# Patient Record
Sex: Female | Born: 1950
Health system: Southern US, Community
[De-identification: ages and names within clinical notes are randomized; demographics above are authoritative.]

## PROBLEM LIST (undated history)

## (undated) DIAGNOSIS — R112 Nausea with vomiting, unspecified: Secondary | ICD-10-CM

## (undated) DIAGNOSIS — I639 Cerebral infarction, unspecified: Secondary | ICD-10-CM

## (undated) DIAGNOSIS — C801 Malignant (primary) neoplasm, unspecified: Secondary | ICD-10-CM

## (undated) DIAGNOSIS — I10 Essential (primary) hypertension: Secondary | ICD-10-CM

## (undated) DIAGNOSIS — F319 Bipolar disorder, unspecified: Secondary | ICD-10-CM

## (undated) DIAGNOSIS — E119 Type 2 diabetes mellitus without complications: Secondary | ICD-10-CM

## (undated) DIAGNOSIS — Z9889 Other specified postprocedural states: Secondary | ICD-10-CM

## (undated) HISTORY — PX: ABDOMINAL HYSTERECTOMY: SHX81

## (undated) HISTORY — PX: TONSILLECTOMY: SUR1361

## (undated) HISTORY — PX: OTHER SURGICAL HISTORY: SHX169

## (undated) HISTORY — PX: CHOLECYSTECTOMY: SHX55

---

## 1999-08-13 ENCOUNTER — Inpatient Hospital Stay (HOSPITAL_COMMUNITY): Admission: EM | Admit: 1999-08-13 | Discharge: 1999-08-14 | Payer: Self-pay | Admitting: Cardiology

## 2000-08-08 ENCOUNTER — Encounter: Payer: Self-pay | Admitting: Family Medicine

## 2000-08-08 ENCOUNTER — Ambulatory Visit (HOSPITAL_COMMUNITY): Admission: RE | Admit: 2000-08-08 | Discharge: 2000-08-08 | Payer: Self-pay | Admitting: Family Medicine

## 2000-12-16 ENCOUNTER — Ambulatory Visit (HOSPITAL_COMMUNITY): Admission: RE | Admit: 2000-12-16 | Discharge: 2000-12-16 | Payer: Self-pay | Admitting: Family Medicine

## 2000-12-16 ENCOUNTER — Encounter: Payer: Self-pay | Admitting: Family Medicine

## 2000-12-21 ENCOUNTER — Encounter: Payer: Self-pay | Admitting: Family Medicine

## 2000-12-21 ENCOUNTER — Ambulatory Visit (HOSPITAL_COMMUNITY): Admission: RE | Admit: 2000-12-21 | Discharge: 2000-12-21 | Payer: Self-pay | Admitting: Family Medicine

## 2000-12-30 ENCOUNTER — Other Ambulatory Visit: Admission: RE | Admit: 2000-12-30 | Discharge: 2000-12-30 | Payer: Self-pay | Admitting: Urology

## 2001-01-16 ENCOUNTER — Ambulatory Visit (HOSPITAL_COMMUNITY): Admission: RE | Admit: 2001-01-16 | Discharge: 2001-01-16 | Payer: Self-pay | Admitting: *Deleted

## 2001-01-16 ENCOUNTER — Encounter: Payer: Self-pay | Admitting: Urology

## 2001-03-22 ENCOUNTER — Encounter: Payer: Self-pay | Admitting: Family Medicine

## 2001-03-22 ENCOUNTER — Ambulatory Visit (HOSPITAL_COMMUNITY): Admission: RE | Admit: 2001-03-22 | Discharge: 2001-03-22 | Payer: Self-pay | Admitting: Family Medicine

## 2001-04-07 ENCOUNTER — Encounter: Payer: Self-pay | Admitting: Family Medicine

## 2001-04-07 ENCOUNTER — Ambulatory Visit (HOSPITAL_COMMUNITY): Admission: RE | Admit: 2001-04-07 | Discharge: 2001-04-07 | Payer: Self-pay | Admitting: Family Medicine

## 2001-04-19 ENCOUNTER — Ambulatory Visit (HOSPITAL_COMMUNITY): Admission: RE | Admit: 2001-04-19 | Discharge: 2001-04-19 | Payer: Self-pay | Admitting: Internal Medicine

## 2001-04-19 ENCOUNTER — Encounter: Payer: Self-pay | Admitting: Internal Medicine

## 2001-04-24 ENCOUNTER — Ambulatory Visit (HOSPITAL_COMMUNITY): Admission: RE | Admit: 2001-04-24 | Discharge: 2001-04-24 | Payer: Self-pay | Admitting: Internal Medicine

## 2001-04-27 ENCOUNTER — Encounter: Payer: Self-pay | Admitting: Internal Medicine

## 2001-04-27 ENCOUNTER — Ambulatory Visit (HOSPITAL_COMMUNITY): Admission: RE | Admit: 2001-04-27 | Discharge: 2001-04-27 | Payer: Self-pay | Admitting: Internal Medicine

## 2001-05-15 ENCOUNTER — Ambulatory Visit (HOSPITAL_COMMUNITY): Admission: RE | Admit: 2001-05-15 | Discharge: 2001-05-15 | Payer: Self-pay | Admitting: Internal Medicine

## 2001-05-15 ENCOUNTER — Encounter: Payer: Self-pay | Admitting: Internal Medicine

## 2001-09-12 ENCOUNTER — Encounter: Payer: Self-pay | Admitting: Family Medicine

## 2001-09-12 ENCOUNTER — Ambulatory Visit (HOSPITAL_COMMUNITY): Admission: RE | Admit: 2001-09-12 | Discharge: 2001-09-12 | Payer: Self-pay | Admitting: Family Medicine

## 2001-10-24 ENCOUNTER — Emergency Department (HOSPITAL_COMMUNITY): Admission: EM | Admit: 2001-10-24 | Discharge: 2001-10-24 | Payer: Self-pay | Admitting: Emergency Medicine

## 2001-11-07 ENCOUNTER — Encounter: Payer: Self-pay | Admitting: Family Medicine

## 2001-11-07 ENCOUNTER — Encounter (HOSPITAL_COMMUNITY): Admission: RE | Admit: 2001-11-07 | Discharge: 2001-12-07 | Payer: Self-pay | Admitting: Family Medicine

## 2001-11-10 ENCOUNTER — Ambulatory Visit (HOSPITAL_COMMUNITY): Admission: RE | Admit: 2001-11-10 | Discharge: 2001-11-10 | Payer: Self-pay | Admitting: Family Medicine

## 2001-11-10 ENCOUNTER — Encounter: Payer: Self-pay | Admitting: Family Medicine

## 2001-11-27 ENCOUNTER — Encounter: Payer: Self-pay | Admitting: Family Medicine

## 2001-11-27 ENCOUNTER — Ambulatory Visit (HOSPITAL_COMMUNITY): Admission: RE | Admit: 2001-11-27 | Discharge: 2001-11-27 | Payer: Self-pay | Admitting: Family Medicine

## 2001-12-08 ENCOUNTER — Ambulatory Visit (HOSPITAL_COMMUNITY): Admission: RE | Admit: 2001-12-08 | Discharge: 2001-12-08 | Payer: Self-pay | Admitting: Family Medicine

## 2001-12-08 ENCOUNTER — Encounter: Payer: Self-pay | Admitting: Family Medicine

## 2001-12-22 ENCOUNTER — Inpatient Hospital Stay (HOSPITAL_COMMUNITY): Admission: EM | Admit: 2001-12-22 | Discharge: 2001-12-24 | Payer: Self-pay | Admitting: Emergency Medicine

## 2002-02-05 ENCOUNTER — Encounter (INDEPENDENT_AMBULATORY_CARE_PROVIDER_SITE_OTHER): Payer: Self-pay

## 2002-02-05 ENCOUNTER — Ambulatory Visit (HOSPITAL_COMMUNITY): Admission: RE | Admit: 2002-02-05 | Discharge: 2002-02-05 | Payer: Self-pay | Admitting: *Deleted

## 2002-05-15 ENCOUNTER — Ambulatory Visit (HOSPITAL_COMMUNITY): Admission: RE | Admit: 2002-05-15 | Discharge: 2002-05-15 | Payer: Self-pay | Admitting: Family Medicine

## 2002-05-15 ENCOUNTER — Encounter: Payer: Self-pay | Admitting: Family Medicine

## 2002-08-21 ENCOUNTER — Encounter: Payer: Self-pay | Admitting: Family Medicine

## 2002-08-21 ENCOUNTER — Ambulatory Visit (HOSPITAL_COMMUNITY): Admission: RE | Admit: 2002-08-21 | Discharge: 2002-08-21 | Payer: Self-pay | Admitting: Family Medicine

## 2002-09-03 ENCOUNTER — Encounter: Payer: Self-pay | Admitting: Family Medicine

## 2002-09-03 ENCOUNTER — Ambulatory Visit (HOSPITAL_COMMUNITY): Admission: RE | Admit: 2002-09-03 | Discharge: 2002-09-03 | Payer: Self-pay | Admitting: Family Medicine

## 2003-02-08 ENCOUNTER — Emergency Department (HOSPITAL_COMMUNITY): Admission: EM | Admit: 2003-02-08 | Discharge: 2003-02-08 | Payer: Self-pay | Admitting: *Deleted

## 2003-10-23 ENCOUNTER — Emergency Department (HOSPITAL_COMMUNITY): Admission: EM | Admit: 2003-10-23 | Discharge: 2003-10-23 | Payer: Self-pay | Admitting: Emergency Medicine

## 2003-12-30 ENCOUNTER — Ambulatory Visit (HOSPITAL_COMMUNITY): Admission: RE | Admit: 2003-12-30 | Discharge: 2003-12-30 | Payer: Self-pay | Admitting: Internal Medicine

## 2004-01-10 ENCOUNTER — Ambulatory Visit (HOSPITAL_COMMUNITY): Admission: RE | Admit: 2004-01-10 | Discharge: 2004-01-10 | Payer: Self-pay | Admitting: Internal Medicine

## 2004-01-31 ENCOUNTER — Ambulatory Visit: Payer: Self-pay | Admitting: Gastroenterology

## 2004-02-03 ENCOUNTER — Ambulatory Visit: Payer: Self-pay | Admitting: Gastroenterology

## 2004-03-31 ENCOUNTER — Inpatient Hospital Stay (HOSPITAL_COMMUNITY): Admission: EM | Admit: 2004-03-31 | Discharge: 2004-04-02 | Payer: Self-pay | Admitting: Emergency Medicine

## 2004-04-02 ENCOUNTER — Inpatient Hospital Stay (HOSPITAL_COMMUNITY): Admission: RE | Admit: 2004-04-02 | Discharge: 2004-04-09 | Payer: Self-pay | Admitting: Psychiatry

## 2004-04-02 ENCOUNTER — Ambulatory Visit: Payer: Self-pay | Admitting: Psychiatry

## 2004-04-05 ENCOUNTER — Encounter: Payer: Self-pay | Admitting: Emergency Medicine

## 2004-04-08 ENCOUNTER — Encounter: Payer: Self-pay | Admitting: Emergency Medicine

## 2004-05-04 ENCOUNTER — Emergency Department (HOSPITAL_COMMUNITY): Admission: EM | Admit: 2004-05-04 | Discharge: 2004-05-04 | Payer: Self-pay | Admitting: Emergency Medicine

## 2004-06-12 IMAGING — CT CT ABDOMEN W/ CM
1 of 4 series · 12 of 32 positions shown, 18 images · IV contrast (CONTRAST)
Comparison: none

FINDINGS
CLINICAL DATA: CHEST AND ABDOMINAL PAIN.
CT CHEST WITH CONTRAST
ROUTINE CT SCAN OF THE CHEST, ABDOMEN AND PELVIS WAS PERFORMED FOLLOWING ORAL CONTRAST
ADMINISTRATION AND DURING INFUSION OF 150 CC OMNIPAQUE 300, IV.  COMPARISON STUDY IS CT SCAN OF THE
ABDOMEN AND PELVIS DATED 09/12/01.
THERE IS NO ENLARGED HILAR, MEDIASTINAL OR AXILLARY ADENOPATHY.  THE HEART AND GREAT VESSELS ARE
UNREMARKABLE IN APPEARANCE.  THERE IS NO PLEURAL OR PERICARDIAL EFFUSION.
EVALUATION OF THE LUNG WINDOWS DEMONSTRATES NO FOCAL INFILTRATE, EDEMA OR PULMONARY NODULES.
IMPRESSION
NEGATIVE CT SCAN OF THE CHEST.
CT ABDOMEN WITH CONTRAST
THE LIVER, SPLEEN, PANCREAS, ADRENAL GLANDS, AND KIDNEYS ARE UNREMARKABLE IN APPEARANCE.  NO
ENLARGED RETROPERITONEAL ADENOPATHY OR ASCITES.  THE VISUALIZED BOWEL IS UNREMARKABLE IN
APPEARANCE.  PATIENT IS STATUS POST CHOLECYSTECTOMY.
STATUS POST CHOLECYSTECTOMY.   NO ACUTE PROCESSES WITHIN THE ABDOMEN.
CT PELVIS WITH CONTRAST
NO ABNORMAL SOFT TISSUE MASSES, FLUID COLLECTIONS OR ENLARGED ADENOPATHY IS DETECTED.  THE PATIENT
IS STATUS POST HYSTERECTOMY.
STATUS POST HYSTERECTOMY; OTHERWISE NEGATIVE CT SCAN OF THE PELVIS.

[Series 5343: — · axial · 0.78mm/px · z∈[+1260,+1644]mm · 12 of 93 slices shown, 18 images]
[im 8/93  soft-tissue]
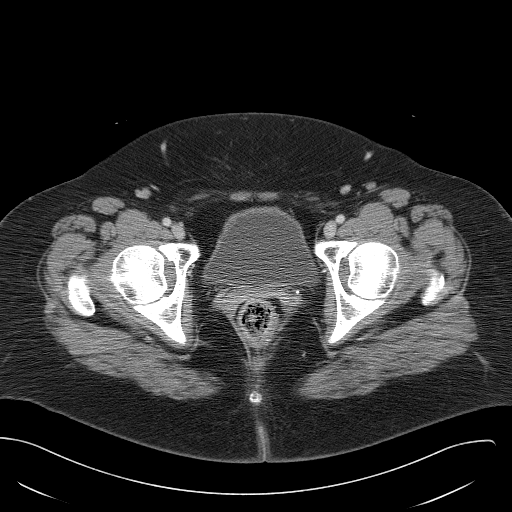
[im 8/93  bone]
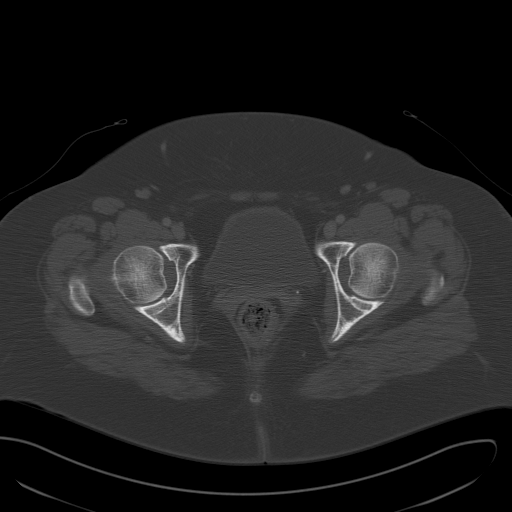
[im 15/93  soft-tissue]
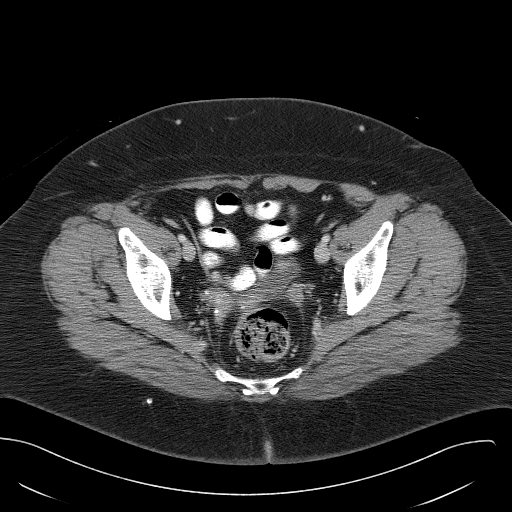
[im 22/93  soft-tissue]
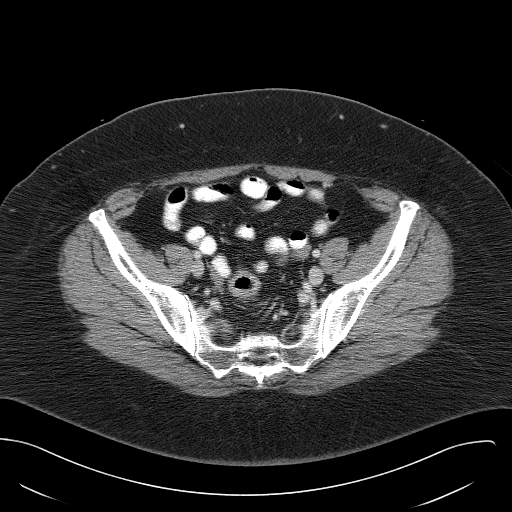
[im 29/93  soft-tissue]
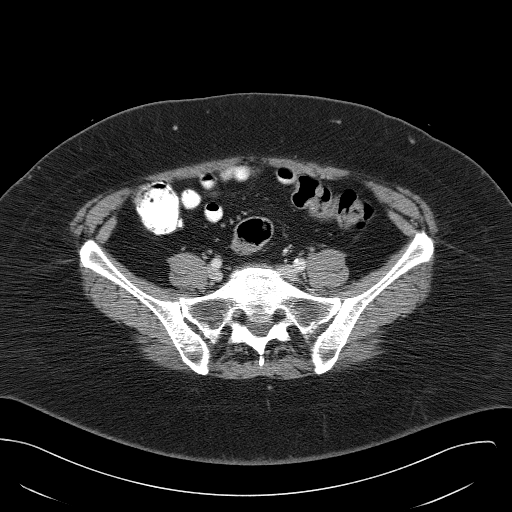
[im 36/93  soft-tissue]
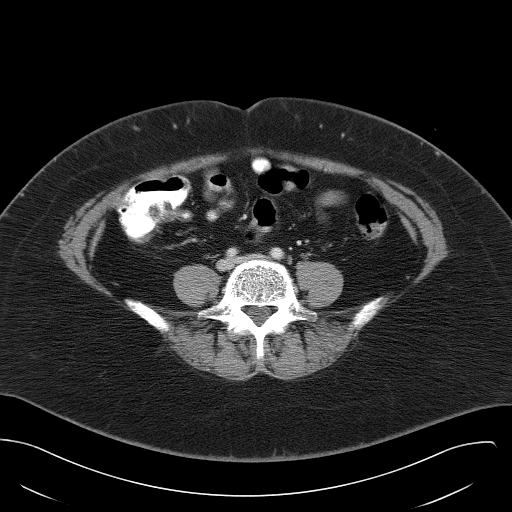
[im 43/93  soft-tissue]
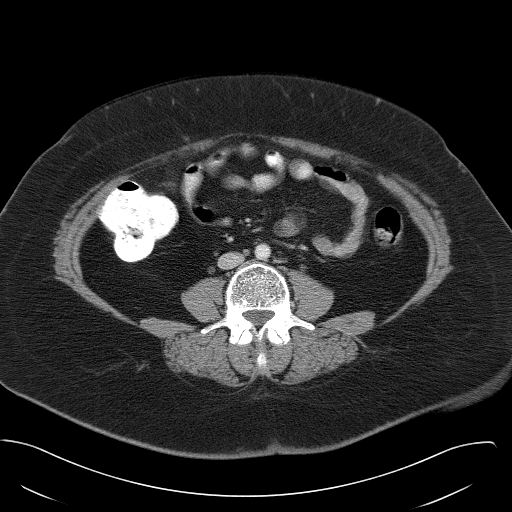
[im 50/93  soft-tissue]
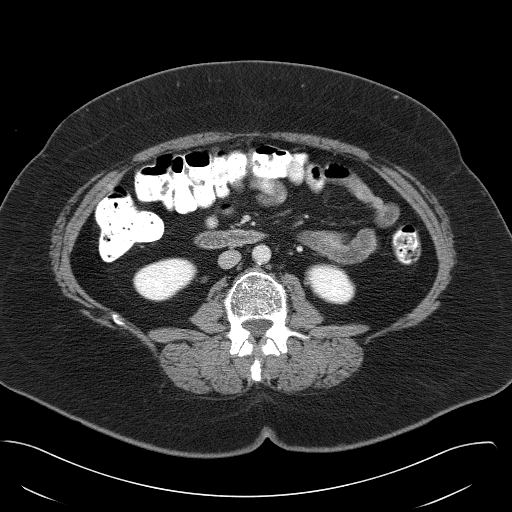
[im 57/93  soft-tissue]
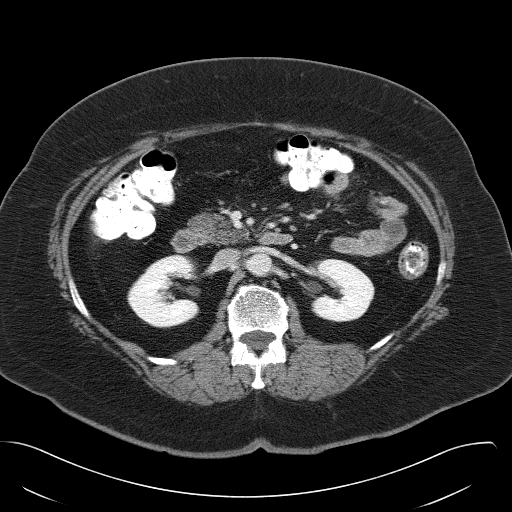
[im 64/93  soft-tissue]
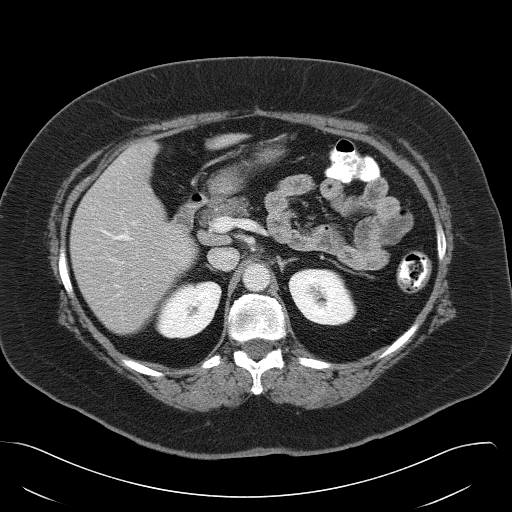
[im 64/93  lung]
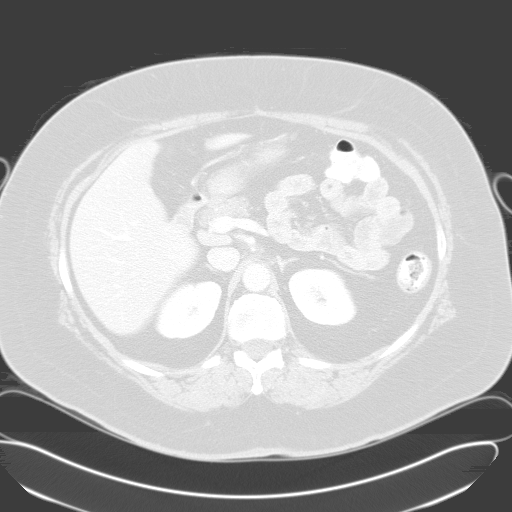
[im 64/93  bone]
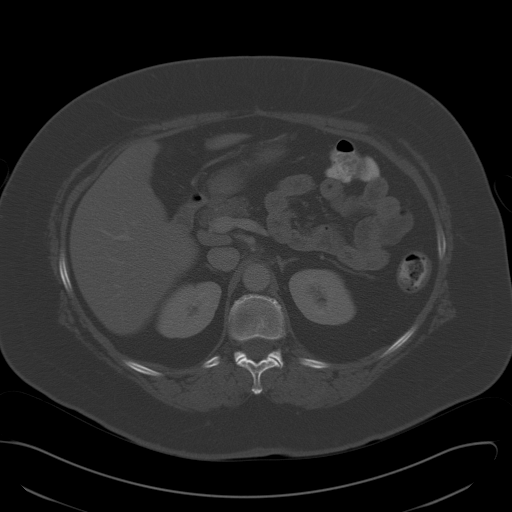
[im 71/93  soft-tissue]
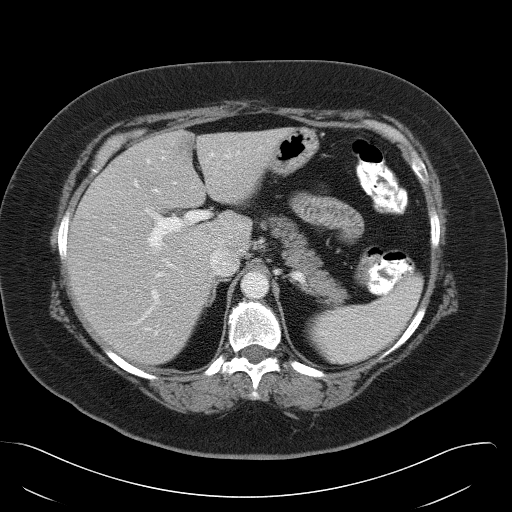
[im 71/93  lung]
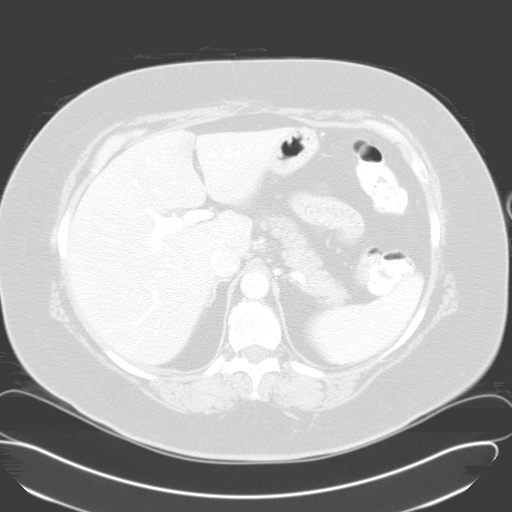
[im 78/93  soft-tissue]
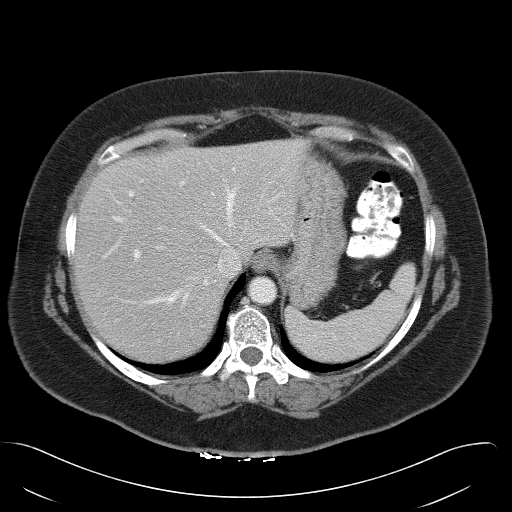
[im 78/93  lung]
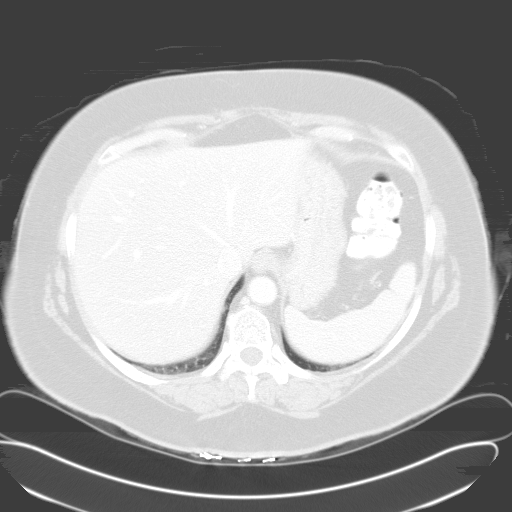
[im 85/93  soft-tissue]
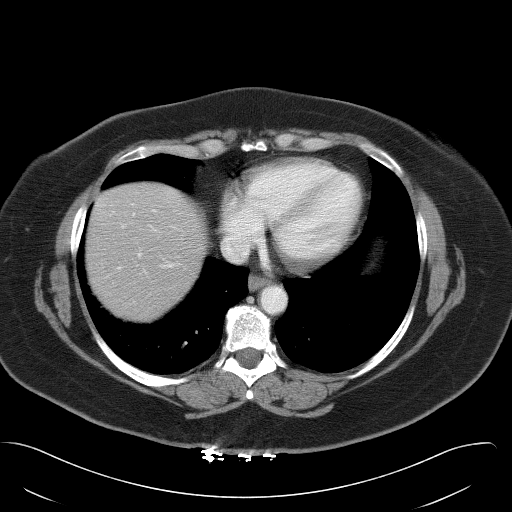
[im 85/93  lung]
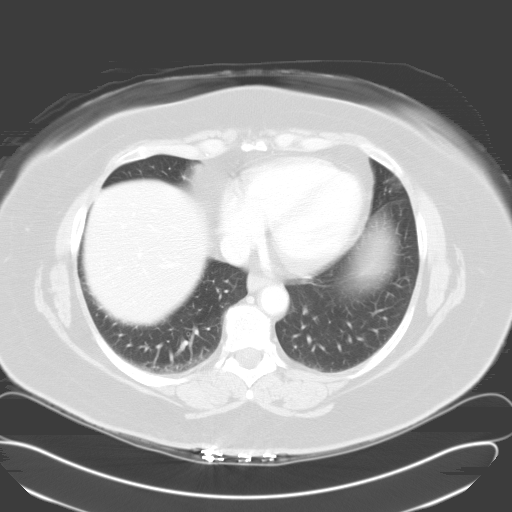

[12 of 32 positions shown; findings below may reference images not displayed]

## 2004-08-13 ENCOUNTER — Ambulatory Visit (HOSPITAL_COMMUNITY): Admission: RE | Admit: 2004-08-13 | Discharge: 2004-08-13 | Payer: Self-pay | Admitting: Family Medicine

## 2004-08-28 ENCOUNTER — Ambulatory Visit (HOSPITAL_COMMUNITY): Admission: RE | Admit: 2004-08-28 | Discharge: 2004-08-28 | Payer: Self-pay | Admitting: Family Medicine

## 2004-10-01 ENCOUNTER — Ambulatory Visit (HOSPITAL_COMMUNITY): Admission: RE | Admit: 2004-10-01 | Discharge: 2004-10-01 | Payer: Self-pay | Admitting: Internal Medicine

## 2005-01-10 ENCOUNTER — Emergency Department (HOSPITAL_COMMUNITY): Admission: EM | Admit: 2005-01-10 | Discharge: 2005-01-10 | Payer: Self-pay | Admitting: Emergency Medicine

## 2008-01-12 ENCOUNTER — Emergency Department (HOSPITAL_COMMUNITY): Admission: EM | Admit: 2008-01-12 | Discharge: 2008-01-12 | Payer: Self-pay | Admitting: Emergency Medicine

## 2010-01-13 ENCOUNTER — Ambulatory Visit (HOSPITAL_COMMUNITY): Admission: RE | Admit: 2010-01-13 | Payer: Self-pay | Admitting: Gastroenterology

## 2010-03-15 ENCOUNTER — Encounter: Payer: Self-pay | Admitting: Internal Medicine

## 2010-03-27 ENCOUNTER — Inpatient Hospital Stay (HOSPITAL_COMMUNITY)
Admit: 2010-03-27 | Discharge: 2010-03-27 | Disposition: A | Discharge status: Home / Self Care | Payer: Medicaid Other | Attending: Internal Medicine | Admitting: Internal Medicine

## 2010-03-27 ENCOUNTER — Encounter (HOSPITAL_COMMUNITY): Payer: Self-pay

## 2010-03-27 ENCOUNTER — Inpatient Hospital Stay (HOSPITAL_COMMUNITY): Payer: Medicaid Other

## 2010-03-27 ENCOUNTER — Inpatient Hospital Stay (HOSPITAL_COMMUNITY)
Admission: EM | Admit: 2010-03-27 | Discharge: 2010-04-03 | DRG: 682 | Disposition: A | Discharge status: Home Health | Payer: Medicaid Other | Attending: Internal Medicine | Admitting: Internal Medicine

## 2010-03-27 DIAGNOSIS — E871 Hypo-osmolality and hyponatremia: Secondary | ICD-10-CM | POA: Diagnosis present

## 2010-03-27 DIAGNOSIS — R9431 Abnormal electrocardiogram [ECG] [EKG]: Secondary | ICD-10-CM | POA: Diagnosis present

## 2010-03-27 DIAGNOSIS — F19939 Other psychoactive substance use, unspecified with withdrawal, unspecified: Secondary | ICD-10-CM | POA: Diagnosis present

## 2010-03-27 DIAGNOSIS — F1320 Sedative, hypnotic or anxiolytic dependence, uncomplicated: Secondary | ICD-10-CM | POA: Diagnosis present

## 2010-03-27 DIAGNOSIS — I10 Essential (primary) hypertension: Secondary | ICD-10-CM | POA: Diagnosis present

## 2010-03-27 DIAGNOSIS — D649 Anemia, unspecified: Secondary | ICD-10-CM | POA: Diagnosis present

## 2010-03-27 DIAGNOSIS — E876 Hypokalemia: Secondary | ICD-10-CM | POA: Diagnosis not present

## 2010-03-27 DIAGNOSIS — F319 Bipolar disorder, unspecified: Secondary | ICD-10-CM | POA: Diagnosis present

## 2010-03-27 DIAGNOSIS — N179 Acute kidney failure, unspecified: Principal | ICD-10-CM | POA: Diagnosis present

## 2010-03-27 DIAGNOSIS — G9349 Other encephalopathy: Secondary | ICD-10-CM | POA: Diagnosis present

## 2010-03-27 DIAGNOSIS — N390 Urinary tract infection, site not specified: Secondary | ICD-10-CM | POA: Diagnosis present

## 2010-03-27 DIAGNOSIS — A498 Other bacterial infections of unspecified site: Secondary | ICD-10-CM | POA: Diagnosis present

## 2010-03-27 DIAGNOSIS — T438X5A Adverse effect of other psychotropic drugs, initial encounter: Secondary | ICD-10-CM | POA: Diagnosis present

## 2010-03-27 HISTORY — DX: Malignant (primary) neoplasm, unspecified: C80.1

## 2010-03-27 HISTORY — DX: Essential (primary) hypertension: I10

## 2010-03-27 LAB — BASIC METABOLIC PANEL
BUN: 62 mg/dL — ABNORMAL HIGH (ref 6–23)
CO2: 20 mEq/L (ref 19–32)
Chloride: 100 mEq/L (ref 96–112)
Creatinine, Ser: 8.92 mg/dL — ABNORMAL HIGH (ref 0.4–1.2)
GFR calc Af Amer: 6 mL/min — ABNORMAL LOW (ref >60)
Potassium: 3.7 mEq/L (ref 3.5–5.1)

## 2010-03-27 LAB — DIFFERENTIAL
Basophils Absolute: 0.1 10*3/uL (ref 0.0–0.1)
Eosinophils Relative: 1 % (ref 0–5)
Lymphocytes Relative: 23 % (ref 12–46)
Monocytes Absolute: 0.6 10*3/uL (ref 0.1–1.0)

## 2010-03-27 LAB — MAGNESIUM: Magnesium: 2 mg/dL (ref 1.5–2.5)

## 2010-03-27 LAB — CARDIAC PANEL(CRET KIN+CKTOT+MB+TROPI)
CK, MB: 1.8 ng/mL (ref 0.3–4.0)
Relative Index: INVALID (ref 0.0–2.5)
Total CK: 51 U/L (ref 7–177)
Troponin I: 0.02 ng/mL (ref 0.00–0.06)

## 2010-03-27 LAB — CK TOTAL AND CKMB (NOT AT ARMC)
CK, MB: 1.9 ng/mL (ref 0.3–4.0)
Relative Index: INVALID (ref 0.0–2.5)

## 2010-03-27 LAB — HEPATIC FUNCTION PANEL
ALT: 17 U/L (ref 0–35)
Albumin: 4.2 g/dL (ref 3.5–5.2)
Indirect Bilirubin: 0.6 mg/dL (ref 0.3–0.9)

## 2010-03-27 LAB — CBC
HCT: 38.1 % (ref 36.0–46.0)
Platelets: 323 10*3/uL (ref 150–400)
RBC: 4.21 MIL/uL (ref 3.87–5.11)
WBC: 13.2 10*3/uL — ABNORMAL HIGH (ref 4.0–10.5)

## 2010-03-27 LAB — LITHIUM LEVEL: Lithium Lvl: 2.58 mEq/L (ref 0.80–1.40)

## 2010-03-27 LAB — LIPASE, BLOOD: Lipase: 190 U/L — ABNORMAL HIGH (ref 11–59)

## 2010-03-27 LAB — PROTIME-INR: Prothrombin Time: 14.4 seconds (ref 11.6–15.2)

## 2010-03-28 ENCOUNTER — Inpatient Hospital Stay (HOSPITAL_COMMUNITY): Payer: Medicaid Other

## 2010-03-28 LAB — URINALYSIS, ROUTINE W REFLEX MICROSCOPIC
Protein, ur: NEGATIVE mg/dL
Urobilinogen, UA: 0.2 mg/dL (ref 0.0–1.0)

## 2010-03-28 LAB — BASIC METABOLIC PANEL
CO2: 26 mEq/L (ref 19–32)
Calcium: 8.3 mg/dL — ABNORMAL LOW (ref 8.4–10.5)
Creatinine, Ser: 4.76 mg/dL — ABNORMAL HIGH (ref 0.4–1.2)
Glucose, Bld: 125 mg/dL — ABNORMAL HIGH (ref 70–99)

## 2010-03-28 LAB — T4, FREE: Free T4: 0.9 ng/dL (ref 0.80–1.80)

## 2010-03-28 LAB — HEPATITIS B SURFACE ANTIBODY,QUALITATIVE: Hep B S Ab: NEGATIVE

## 2010-03-28 LAB — RAPID URINE DRUG SCREEN, HOSP PERFORMED
Amphetamines: NOT DETECTED
Barbiturates: NOT DETECTED
Benzodiazepines: POSITIVE — AB

## 2010-03-28 LAB — URINE MICROSCOPIC-ADD ON

## 2010-03-28 LAB — HEPATITIS B SURFACE ANTIGEN: Hepatitis B Surface Ag: NEGATIVE

## 2010-03-28 LAB — TSH: TSH: 3.56 u[IU]/mL (ref 0.350–4.500)

## 2010-03-28 NOTE — Op Note (Signed)
  [name], [name]              ACCOUNT NO.:  [id number]  MEDICAL RECORD NO.:  [id number]           PATIENT TYPE:  I  LOCATION:  IC10                          FACILITY:  [facility]  PHYSICIAN:  Dalia Heading, M.D.  DATE OF BIRTH:  [birth year]  DATE OF PROCEDURE:  03/27/2010 DATE OF DISCHARGE:                              OPERATIVE REPORT   CHIEF COMPLAINT:  Need for dialysis due to lithium toxicity.  PROCEDURE NOTE:  At the request of Dr. Kristian Covey, a right femoral hemodialysis catheter is being inserted for urgent hemodialysis due to lithium toxicity.  The risks and benefits of the procedure were fully explained to the patient, gave informed consent.  Time-out was performed.  The right groin was prepped and draped using the usual sterile technique with Betadine.  A 1% Xylocaine was used for local anesthesia.  A needle was advanced into the right femoral vein using the Seldinger technique without difficulty.  A guidewire was then advanced and the catheter was inserted over the guidewire.  The guidewire was removed.  Good backflow was noted from both ports.  It was flushed with saline.  A dry sterile dressing was applied.  The patient tolerated the procedure well and is ready for dialysis.     Dalia Heading, M.D.     MAJ/MEDQ  D:  03/27/2010  T:  03/28/2010  Job:  [id number]  cc:   Jorja Loa, M.D. Fax: [phone]  Electronically Signed by Franky Macho M.D. on 03/28/2010 01:09:56 PM

## 2010-03-28 NOTE — H&P (Signed)
[name], [name]              ACCOUNT NO.:  [id number]  MEDICAL RECORD NO.:  [id number]           PATIENT TYPE:  I  LOCATION:  IC10                          FACILITY:  [facility]  PHYSICIAN:  Elliot Cousin, M.D.    DATE OF BIRTH:  03-21-1950  DATE OF ADMISSION:  03/27/2010 DATE OF DISCHARGE:  LH                             HISTORY & PHYSICAL   PRIMARY PSYCHIATRIST:  [facility], Dr. Raliegh Scarlet.  CHIEF COMPLAINT:  Nausea, vomiting, and diarrhea.  HISTORY OF PRESENT ILLNESS:  The patient is a 60 year old woman with a past medical history significant for bipolar disorder, esophagitis, and hypertension.  She presents to the emergency department today with a 3- week history of progressive nausea, vomiting, and diarrhea.  Her symptoms started approximately 3 weeks ago.  They have become progressively worse.  Over the past 2-3 days, she has been unable to keep any liquids or solids down.  She has been able to tolerate most of her medicines; however, she stopped taking lithium carbonate 1-2 weeks ago.  She says that every time, she tried to take the lithium, she became more nauseated and had worsening vomiting.  Over the past 24 hours, she has had at least 10 episodes of vomiting.  She denies bright red blood in her emesis.  She denies coffee-ground emesis.  She has also had multiple loose bowel movements, numbering greater than 5 over the past 24 hours, but more than 15-20 over the past 2-3 days.  She denies bright red blood per rectum.  She denies black tarry stools.  She has had no abdominal pain or pain with urination.  She has not urinated over the past 24-48 hours.  She has had subjective fever and chills.  She has had lightheadedness, spinning dizziness, a generalized frontal headache, blurred vision, and a hand tremor.  She denies chest pain, shortness of breath, or pleurisy.  She has had some swelling in her ankles bilaterally.  In the emergency department, the patient is bradycardic  with a heart rate of 55 beats per minute.  She is afebrile.  Her blood pressure is 109/55.  Her EKG reveals sinus bradycardia with a first-degree AV block, T-wave abnormalities, and prolonged QT interval of 524/501.  Her lithium level is 2.58.  Her BUN is 62.  Her creatinine is 8.92.  Her lipase is 190.  Her WBC is 13.2.  She is being admitted for further evaluation and management.  Of note, I did request that the emergency department physician Dr. Radford Pax consult nephrologist Dr. Kristian Covey right away.  PAST MEDICAL HISTORY: 1. Bipolar disorder. 2. History of tricyclic overdose/suicide attempt in February 2006.     The patient was treated at [facility] as an inpatient. 3. History of chest pain with a negative Cardiolite stress test in     November 2003. 4. Esophagitis and mild gastritis per EGD in December 2003. 5. Unremarkable colonoscopy in December 2003. 6. Hypertension. 7. Obesity. 8. Status post cholecystectomy. 9. Status post hysterectomy.  The patient says that she had abnormal     vaginal cells, but no outright cancer.  MEDICATIONS: 1. Metoprolol tartrate 50 mg 1-1/2  tablets daily. 2. Vistaril 25 mg at bedtime. 3. Aspirin 81 mg daily. 4. Lithium carbonate 300 mg 1 tablet in the morning and 2 tablets at     bedtime.  The patient states that she has been on this regimen     for at least 6 months. 5. Lisinopril 20 mg daily. 6. Dexilant 30 mg daily.  ALLERGIES:  THE PATIENT HAS AN INTOLERANCE TO ERYTHROMYCIN, WHICH CAUSES ABDOMINAL PAIN.  SOCIAL HISTORY:  The patient is married.  She [place].  She is raising her granddaughter.  Her daughter died several years ago.  She quit smoking over 10 years ago.  She denies alcohol and illicit drug use.  FAMILY HISTORY:  Her father is 58 years of age and has hypertension. Her mother is 74 years of age and has had a stroke.  She has heart disease, hypertension, and degenerative  joint disease as well.  REVIEW OF SYSTEMS:  As above in history of present illness.  PHYSICAL EXAMINATION:  VITAL SIGNS:  Temperature 98.5, blood pressure 109/55, pulse 55, respiratory rate 20, and oxygen saturation 97% on room air. GENERAL:  The patient is an alert, obese 60 year old Caucasian woman who is currently sitting up in bed in no acute distress. HEENT:  Head is normocephalic and nontraumatic.  Pupils equal, round, and reactive to light.  Extraocular movements are intact.  Conjunctivae are clear.  Sclerae are white.  Tympanic membranes not examined.  Nasal mucosa is dry.  No sinus tenderness.  Oropharynx reveals no teeth. Mucous membranes are dry.  No posterior exudates or erythema. NECK:  Supple.  No adenopathy.  No thyromegaly.  No bruit.  No JVD. LUNGS:  Decreased breath sounds in the bases, otherwise clear. HEART:  S1, S2 with a 1-2/6 systolic murmur. ABDOMEN:  Well-healed vertical abdominal scar.  Bowel sounds are hypoactive.  Abdomen is soft, nontender, and nondistended. GU:  Deferred. RECTAL:  Deferred. EXTREMITIES:  Pedal pulses are palpable bilaterally.  Trace to 1+ pedal edema bilaterally. NEUROLOGIC:  The patient is alert and oriented x3.  Cranial nerves II through XII are intact.  I do not detect a significant nystagmus.  She does have a mild hand tremor bilaterally.  Finger-to-nose cerebellar testing intact with no significant overshoot.  Strength globally is 5/5 of the upper and lower extremities in the sitting position. PSYCHOLOGICAL:  The patient is alert and oriented x3.  Her affect is flat.  She is cooperative.  Her speech is clear and not pressured.  ADMISSION LABORATORIES:  EKG reveals sinus bradycardia with a first- degree AV block, anterior T-wave abnormalities, and prolonged QT interval of 524/501.  WBC 13.2, hemoglobin 13.6, platelet count 323, lipase 190, total bilirubin 0.7, alkaline phosphatase 125, SGOT 20, SGPT 17, total protein 7.1, and  albumin 4.2.  Sodium 131, potassium 3.7, chloride 100, CO2 of 20, glucose 120, BUN 62, creatinine 8.92, calcium 9.3.  Lithium level 2.58.  ASSESSMENT: 1. Acute renal failure in the setting of lithium toxicity.  The     patient was noted to have a normal creatinine of 0.9 over 1 year     ago.  Her lithium level was apparently assessed last month;     however, she was never called with the results.  It is likely that     the patient's lithium toxicity is the cause of acute renal failure.     It is interesting to note that she stopped taking lithium over 1  week ago, as it caused more nausea and vomiting.  It is likely that     her lithium level was more elevated at that time.  The patient is     also treated chronically with an ACE inhibitor, which could     certainly be contributing to the acute renal failure.  Per history,     she has been an anuric or oliguric over the past couple of days.     She has a mild tremor, nausea, vomiting, and loose stools, all the     consequence of lithium toxicity and perhaps uremia. 2. Elevated lipase.  The patient has no abdominal pain.  The lipase     may be elevated because of renal failure.  Acute pancreatitis is     certainly a consideration given her presenting symptoms.  This will     be monitored.3. Hyponatremia.  The patient's serum sodium is 131.  It is likely     that her hyponatremia is secondary to acute renal failure and     volume depletion. 4. Abnormal EKG changes including sinus bradycardia, first-degree AV     block, and prolonged QT interval.  These changes are likely the     consequence of acute renal failure and lithium toxicity. 5. Bipolar disorder.  The patient has had no exacerbation      subjectively. 6. History of hypertension.  The patient's blood pressure is on the     lower side of normal currently.  PLAN: 1. I did ask the emergency department physician, Dr. Radford Pax to consult     Dr. Kristian Covey, as the patient probably  needs urgent hemodialysis. 2. We will officially consult Dr. Kristian Covey, for further management. 3. IV fluid hydration per the recommendations of Dr. Kristian Covey. 4. Supportive treatment with antiemetics.  We will start IV Pepcid 20     mg q.12h.  We will start a clear liquid diet without advancing it. 5. For further evaluation, we will check a magnesium level, urinalysis     if possible, urine culture, urine drug screen, and a renal     ultrasound. 6. We will assess the patient's thyroid function with a TSH and free     T4, although they may be abnormal in the setting of acute renal     failure and lithium toxicity.  We will also check cardiac enzymes     x2, PTT and PT. 7. We will check a chest x-ray to assess pneumonia given that the     patient's white blood cell count is slightly     elevated.  The leukocytosis may be reactive. 8. We will order an EKG in the morning to assess for interval changes. 9. We will follow her blood lithium level daily or as needed.     Elliot Cousin, M.D.     DF/MEDQ  D:  03/27/2010  T:  03/27/2010  Job:  [id number]  cc:   [facility] [place]  Dr. Raliegh Scarlet  Electronically Signed by Elliot Cousin M.D. on 03/28/2010 06:33:13 PM

## 2010-03-29 LAB — CBC
HCT: 32.9 % — ABNORMAL LOW (ref 36.0–46.0)
Hemoglobin: 11.8 g/dL — ABNORMAL LOW (ref 12.0–15.0)
MCV: 90.1 fL (ref 78.0–100.0)
RBC: 3.65 MIL/uL — ABNORMAL LOW (ref 3.87–5.11)
WBC: 9.8 10*3/uL (ref 4.0–10.5)

## 2010-03-29 LAB — URINALYSIS, ROUTINE W REFLEX MICROSCOPIC
Leukocytes, UA: NEGATIVE
Nitrite: NEGATIVE
Protein, ur: NEGATIVE mg/dL
Urine Glucose, Fasting: NEGATIVE mg/dL
pH: 6 (ref 5.0–8.0)

## 2010-03-29 LAB — RENAL FUNCTION PANEL
CO2: 23 mEq/L (ref 19–32)
GFR calc Af Amer: 32 mL/min — ABNORMAL LOW (ref >60)
GFR calc non Af Amer: 27 mL/min — ABNORMAL LOW (ref >60)
Glucose, Bld: 139 mg/dL — ABNORMAL HIGH (ref 70–99)
Potassium: 3.7 mEq/L (ref 3.5–5.1)
Sodium: 138 mEq/L (ref 135–145)

## 2010-03-29 LAB — DIFFERENTIAL
Lymphocytes Relative: 26 % (ref 12–46)
Lymphs Abs: 2.6 10*3/uL (ref 0.7–4.0)
Neutro Abs: 6.5 10*3/uL (ref 1.7–7.7)
Neutrophils Relative %: 66 % (ref 43–77)

## 2010-03-29 LAB — BASIC METABOLIC PANEL
BUN: 16 mg/dL (ref 6–23)
Chloride: 106 mEq/L (ref 96–112)
GFR calc Af Amer: 33 mL/min — ABNORMAL LOW (ref >60)
Potassium: 3.7 mEq/L (ref 3.5–5.1)
Sodium: 138 mEq/L (ref 135–145)

## 2010-03-29 LAB — LIPASE, BLOOD: Lipase: 91 U/L — ABNORMAL HIGH (ref 11–59)

## 2010-03-29 LAB — URINE MICROSCOPIC-ADD ON

## 2010-03-30 LAB — URINE CULTURE
Colony Count: >=100000
Culture  Setup Time: 201202042025

## 2010-03-30 LAB — BASIC METABOLIC PANEL
CO2: 23 mEq/L (ref 19–32)
Calcium: 9.2 mg/dL (ref 8.4–10.5)
GFR calc Af Amer: 47 mL/min — ABNORMAL LOW (ref >60)
GFR calc non Af Amer: 39 mL/min — ABNORMAL LOW (ref >60)
Sodium: 145 mEq/L (ref 135–145)

## 2010-03-30 LAB — LITHIUM LEVEL: Lithium Lvl: 0.78 mEq/L — ABNORMAL LOW (ref 0.80–1.40)

## 2010-03-30 LAB — LIPASE, BLOOD: Lipase: 99 U/L — ABNORMAL HIGH (ref 11–59)

## 2010-03-31 ENCOUNTER — Encounter (HOSPITAL_COMMUNITY): Payer: Self-pay | Admitting: Radiology

## 2010-03-31 ENCOUNTER — Inpatient Hospital Stay (HOSPITAL_COMMUNITY): Payer: Medicaid Other

## 2010-03-31 LAB — URINE CULTURE: Special Requests: NEGATIVE

## 2010-03-31 LAB — RENAL FUNCTION PANEL
Albumin: 3.6 g/dL (ref 3.5–5.2)
BUN: 13 mg/dL (ref 6–23)
CO2: 25 mEq/L (ref 19–32)
Chloride: 109 mEq/L (ref 96–112)
Creatinine, Ser: 1.29 mg/dL — ABNORMAL HIGH (ref 0.4–1.2)
GFR calc Af Amer: 52 mL/min — ABNORMAL LOW (ref >60)
GFR calc non Af Amer: 43 mL/min — ABNORMAL LOW (ref >60)
Potassium: 3.4 mEq/L — ABNORMAL LOW (ref 3.5–5.1)

## 2010-03-31 LAB — DIFFERENTIAL
Basophils Absolute: 0 10*3/uL (ref 0.0–0.1)
Lymphocytes Relative: 27 % (ref 12–46)
Lymphs Abs: 2.8 10*3/uL (ref 0.7–4.0)
Neutro Abs: 6.8 10*3/uL (ref 1.7–7.7)
Neutrophils Relative %: 64 % (ref 43–77)

## 2010-03-31 LAB — BASIC METABOLIC PANEL
BUN: 14 mg/dL (ref 6–23)
Chloride: 110 mEq/L (ref 96–112)
GFR calc non Af Amer: 43 mL/min — ABNORMAL LOW (ref >60)
Glucose, Bld: 127 mg/dL — ABNORMAL HIGH (ref 70–99)
Potassium: 3.3 mEq/L — ABNORMAL LOW (ref 3.5–5.1)
Sodium: 146 mEq/L — ABNORMAL HIGH (ref 135–145)

## 2010-03-31 LAB — OSMOLALITY: Osmolality: 296 mOsm/kg (ref 275–300)

## 2010-03-31 LAB — CBC
HCT: 29.1 % — ABNORMAL LOW (ref 36.0–46.0)
Hemoglobin: 10.5 g/dL — ABNORMAL LOW (ref 12.0–15.0)
MCV: 91.2 fL (ref 78.0–100.0)
RBC: 3.19 MIL/uL — ABNORMAL LOW (ref 3.87–5.11)
WBC: 10.6 10*3/uL — ABNORMAL HIGH (ref 4.0–10.5)

## 2010-03-31 LAB — OSMOLALITY, URINE: Osmolality, Ur: 408 mOsm/kg (ref 390–1090)

## 2010-03-31 LAB — VITAMIN B12: Vitamin B-12: 288 pg/mL (ref 211–911)

## 2010-04-02 DIAGNOSIS — F3112 Bipolar disorder, current episode manic without psychotic features, moderate: Secondary | ICD-10-CM

## 2010-04-02 LAB — FOLATE RBC: RBC Folate: 888 ng/mL — ABNORMAL HIGH (ref 180–600)

## 2010-04-03 LAB — DIFFERENTIAL
Basophils Absolute: 0.1 10*3/uL (ref 0.0–0.1)
Eosinophils Absolute: 0.5 10*3/uL (ref 0.0–0.7)
Eosinophils Relative: 6 % — ABNORMAL HIGH (ref 0–5)
Lymphs Abs: 2.7 10*3/uL (ref 0.7–4.0)
Neutrophils Relative %: 50 % (ref 43–77)

## 2010-04-03 LAB — CBC
MCHC: 35.7 g/dL (ref 30.0–36.0)
Platelets: 299 10*3/uL (ref 150–400)
RDW: 12.4 % (ref 11.5–15.5)
WBC: 7.8 10*3/uL (ref 4.0–10.5)

## 2010-04-03 LAB — BASIC METABOLIC PANEL
Calcium: 8.5 mg/dL (ref 8.4–10.5)
GFR calc Af Amer: >60 mL/min (ref >60)
GFR calc non Af Amer: 59 mL/min — ABNORMAL LOW (ref >60)
Potassium: 3.2 mEq/L — ABNORMAL LOW (ref 3.5–5.1)
Sodium: 143 mEq/L (ref 135–145)

## 2010-04-05 NOTE — Consult Note (Signed)
  [name], [name]              ACCOUNT NO.:  [id number]  MEDICAL RECORD NO.:  [id number]           PATIENT TYPE:  I  LOCATION:  A330                          FACILITY:  [facility]  PHYSICIAN:  Eulogio Ditch, MD DATE OF BIRTH:  1950/06/24  DATE OF CONSULTATION:  04/03/2010 DATE OF DISCHARGE:                                CONSULTATION   REASON FOR CONSULTATION:  Lithium toxicity, history of bipolar disorder, medication management.  HISTORY OF PRESENT ILLNESS:  A 60 year old female who has a history of bipolar disorder, was on lithium for the last 1 year, admitted to the hospital because of lithium toxicity.  The patient told me that she was on Paxil and Prozac in the past and those medications were not helping her.  The patient is very logical and goal directed during the interview, is not internally preoccupied, is not confused, no manic symptoms noted, no abnormal movements noted.  The patient denies any suicidal or homicidal ideations.  PAST PSYCH HISTORY: 1. History of bipolar disorder followed in [place] in the outpatient     setting. 2. The patient was on lithium 300 mg 1 tablet in the morning, 2     tablets at bedtime.  MEDICAL HISTORY:  History of hypertension.  No known drug allergies.  MENTAL STATUS EXAMINATION:  Cooperative during interview.  Fair eye contact.  No abnormal movements noticed.  No psychomotor agitation or retardation noted during the interview.  Mood euthymic, affect and mood congruent.  Speech normal.  Thought process logical and goal directed. Thought content, not suicidal or homicidal, not delusional.  Thought perception, no audiovisual hallucination reported, not internally preoccupied.  Cognition alert, awake, oriented x3.  Memory immediate. Recent remote fair.  Attention and concentration fair.  Abstraction ability fair.  Insight and judgment intact.  DIAGNOSES:  AXIS I:  As per history, bipolar disorder type 1. AXIS II:  Deferred. AXIS  III:  Hypertension. AXIS IV:  Chronic mental issues. AXIS V:  50-60.  RECOMMENDATIONS: 1. As discussed with Dr. Lendell Caprice yesterday, the patient is started on     Risperdal 0.25 mg twice a day.  The patient denied any problems     today with Risperdal.  I increased the medication to Risperdal 0.5     twice a day. 2. I told the patient that Risperdal is approved for bipolar disorder     and is a good medication without the number of problems as with     lithium. 3. The patient agreed that she do not want to be on lithium and she     agreed to try Risperdal. 4. I told the patient she can follow up with outpatient doctor and she     does not need to be admitted to [facility] at this time.     The patient agrees with the treatment plan.     Eulogio Ditch, MD     SA/MEDQ  D:  04/03/2010  T:  04/03/2010  Job:  [id number]  Electronically Signed by Eulogio Ditch  on 04/05/2010 01:59:08 PM

## 2010-04-19 NOTE — Discharge Summary (Signed)
NAMEEKATERINA, [name]              ACCOUNT NO.:  [id number]  MEDICAL RECORD NO.:  [id number]           PATIENT TYPE:  I  LOCATION:  A330                          FACILITY:  [facility]  PHYSICIAN:  Tobey Lippard L. Lendell Caprice, MDDATE OF BIRTH:  03/13/1950  DATE OF ADMISSION:  03/27/2010 DATE OF DISCHARGE:  02/10/2012LH                              DISCHARGE SUMMARY   DISCHARGE DIAGNOSES: 1. Chronic lithium toxicity. 2. Bipolar disorder. 3. Resolved vomiting and diarrhea. 4. Delirium, multifactorial, secondary to probable benzodiazepine     withdrawal, lithium toxicity, and encephalopathy from urinary tract     infection. 5. Escherichia coli urinary tract infection. 6. Deconditioning. 7. Hypertension. 8. History of esophagitis. 9. Morbid obesity. 10.Resolved QT prolongation. 11.Hypokalemia. 12.Anemia. 13.Acute renal failure. 14.Elevated lipase.  DISCHARGE MEDICATIONS: 1. Stop Lithium. 2. Stop lisinopril. 3. Cephalexin 500 mg p.o. b.i.d. for another 5 days. 4. Risperidone 0.5 mg p.o. b.i.d. 5. Xanax 0.5 mg nightly. 6. Aspirin 81 mg a day. 7. Dexlansoprazole 30 mg a day. 8. Hydroxyzine 25 mg nightly. 9. Metoprolol tartrate 50 mg 1-1/2 tablets daily.  CONDITION:  Stable.  ACTIVITY:  Increase slowly and walk with assistance.  Home physical therapy will be arranged.  CONSULTATIONS: 1. Jorja Loa, MD 2. Dalia Heading, MD 3. Eulogio Ditch, MD  PROCEDURES:  Right femoral hemodialysis catheter placement which has been removed, and hemodialysis.  Condition is stable.  FOLLOWUP:  Follow up with [facility] for medication management.  Also we will arrange home physical therapy nurse and social worker.  DIET:  Should be heart-healthy.  LABORATORY DATA:  CBC on admission significant for a white blood cell count of 13,000 with a normal differential.  Her hemoglobin on admission was normal, but she was volume depleted.  At discharge, her hemoglobin is 10.6, he white count is  7.8, hematocrit 29.  PT/PTT normal.  On admission, her sodium was 131, potassium 3.7, chloride 100, bicarbonate 20, glucose 120, BUN 62, and creatinine 8.92.  Liver function tests significant for an alkaline phosphatase of 125.  At discharge, her sodium is 143, potassium 3.2, chloride 108, bicarbonate 25, glucose 132, BUN 7, creatinine 0.98, magnesium normal, phosphorus normal, blood glucose monitoring stabilized to about 100, and ammonia level 16. Cardiac enzymes negative.  TSH 3.5, free T4 0.9, hepatitis B surface antigen and surface antibody negative, hepatitis C antibody negative. Lithium level on admission was 2.58.  On February 6, it was low at 0.78. Urine drug screen showed benzodiazepines and opiates.  On admission, a urinalysis showed a specific gravity of greater than 1.030, cloudy yellow, moderate bilirubin, trace glucose, moderate blood, negative nitrate, negative leukocyte esterase, negative protein, few squamous epithelial cells, calcium oxalate crystals in uric acid crystals present, 3-6 white cells, 11-20 red cells, many bacteria.  Blood cultures taken repeatedly showed E.coli greater than 100,000, which upon repeat testing showed pansensitive.  DIAGNOSTICS:  Chest x-ray showed right basilar atelectasis.  Renal ultrasound showed no hydronephrosis and otherwise normal.  CT brain on February 7 showed normal unenhanced brain CT.  EKG on admission showed sinus bradycardia, anterior flipped T-waves, prolonged QTC of 501 milliseconds.  Repeat EKG showed junctional  rhythm but a lot of artifact, QTC was 484.  On February 5, EKG showed normal sinus rhythm with continued flipped T-waves anteriorly and QTC was 515 milliseconds. EKG on February 7 showed a lot of baseline artifact junctional accelerated rhythm, age indeterminate anterior infarct, and normal QTC.  HISTORY AND HOSPITAL COURSE:  Please see H and P for details.  [name] is a 60 year old white female, on lithium for  bipolar disorder, who presented with several weeks' worth of vomiting and diarrhea.  She had stopped her lithium 1-2 weeks prior to admission as it was worsening her symptoms.  Despite this, she had a lithium level of 2-1/2 on admission.  Therefore, she was felt to have chronic to lithium toxicity. She also was anuric for the days prior to admission, she had subjective fevers and chills.  She had a heart rate of 55, but otherwise normal vital signs.  She was in no acute distress.  She had dry mucous membranes, soft abdomen.  She was found to be in lithium toxic and in acute renal failure.  She was admitted to the Hospitalist Service. Nephrology and Surgery were consulted.  She received hemodialysis and her lithium level decreased to the sub toxic range.  Also, she was given IV fluids and her renal failure has resolved.  Previously she was on an ACE inhibitor which was stopped.  She can be started back on her metoprolol.  And potentially her ACE inhibitor in the future if needed. During the hospitalization, she had problems with confusion.  Upon further questioning, she takes Xanax at home possibly daily and it was felt that she may have an element of withdrawal.  She was started on low dose Ativan and her delirium improved.  Other workup for dementia or stroke were negative.  She continued to have periods of occasional catatonia and occasional racing thoughts, but this has improved and her mental status and affect are now at baseline.  She had some problems with difficulty walking despite normal lithium levels.  This improved and may have only been related to deconditioning.  Nevertheless, it was felt unsafe to resume lithium, so Telepsychiatry Consult was obtained. Dr. Rogers Blocker, has recommended Risperdal.  She was started on 0.25 mg b.i.d. and he recommends increasing it to 0.5 mg p.o. b.i.d.  The patient also had urinalysis which showed many bacteria, but negative nitrite or leukocyte  esterase.  She did, however, have to culture, urine cultures which grew out greater than 100,000 colonies of E. coli, so she was started initially on Ancef and subsequently on cephalexin.  At the time of discharge, the patient's mental status, labs, appetite, functional status is close to baseline and she is stable for discharge. Total time on the day of discharge is greater than 30 minutes.     Apolonio Cutting L. Lendell Caprice, MD     CLS/MEDQ  D:  04/03/2010  T:  04/04/2010  Job:  [id number]  cc:   Dr. Berneta Sages  [facility]  Jorja Loa, M.D. Fax: [phone]  Electronically Signed by Crista Curb MD on 04/19/2010 09:24:19 PM

## 2010-04-27 NOTE — Consult Note (Signed)
[name], [name]              ACCOUNT NO.:  [id number]  MEDICAL RECORD NO.:  [id number]           PATIENT TYPE:  I  LOCATION:  IC10                          FACILITY:  [facility]  PHYSICIAN:  Jorja Loa, M.D.DATE OF BIRTH:  [birth year]  DATE OF CONSULTATION:  03/27/2010 DATE OF DISCHARGE:                                CONSULTATION   REASON FOR CONSULT:  Renal failure and lithium overdose.  Ms. Pereyra is a patient who is 60 year old with multiple medical history including history of hypertension, history of bipolar disorder and previous history of suicide attempt.  Presently, came to the emergency room because of nausea, vomiting and weakness for about 3 weeks.  According to the patient, she started having some nausea or vomiting.  She was not able to keep down anything and this is associated also with diarrhea.  For the last couple of weeks, she thought this will improve and she does not and because of that she decided to come to the emergency room.  In emergency room, she was found to have a lithium of 2.5 and creatinine of 8.9, hence consult is called.  At this moment, the patient denies any previous history of suicidal ideation.  She just becomes sick and because of that, she was not taking given some of the medication including the lithium.  She denies any fevers, chills or sweating.  She denies also any previous history of renal failure or kidney stone.  The patient denies also any shortness of breath, orthopnea or paroxysmal nocturnal dyspnea.  PAST MEDICAL HISTORY:  She has history of hypertension.  She has history of coronary artery disease.  She has history of GERD.  She has history of bipolar disorder.  She also states she has regular heart rate and history of possible CVA at one time and history of also suicidal attempt by taking prescription medications.  Her medications as an outpatient the only thing she remembers, she is on metoprolol 50 mg p.o. daily and  also she is on lithium which she is not taking it.  Otherwise, she does not remember any other medications.  ALLERGIES:  She is allergic to SULFA, ERYTHROMYCIN and CODEINE.  SOCIAL HISTORY:  She denies any history of smoking and she lives at home.  She denies also using illicit drug use.  FAMILY HISTORY:  No history of renal failure.  REVIEW OF SYSTEMS:  Her main complaint seems to be nausea, vomiting, unable to keep anything down.  She also states that she is feeling weak and also tired.  She denies any fevers, chills or sweating.  She denies any shortness of breath, orthopnea, no paroxysmal nocturnal dyspnea. She denies also any chest pain.  PHYSICAL EXAMINATION:  GENERAL:  The patient is alert and she does not seem to be in any apparent distress. VITAL SIGNS:  Blood pressure 132/71.  She is afebrile.  Sitting by bedside. HEENT:  No conjunctival pallor, non icterus.  The patient does not have any nystagmus.  She has a slightly shaking.  However, she does not have any significant tremor.  Her oral mucosa seems to be dry. NECK:  Supple.  No JVD. CHEST:  Clear to auscultation.  No rales or rhonchi.  No egophony. HEART:  Regular rate and rhythm.  No murmur. ABDOMEN:  Soft, positive bowel sounds. EXTREMITIES:  No edema.  LABORATORY DATA:  Her white blood cell count is 13.2, hemoglobin is 13.1, hematocrit 38.1.  Sodium is 131, potassium 3.7, BUN is 62, creatinine is 8.9, alk phosphate is 125 and calcium is 9.3, lipase is 190.  Her lithium level is 2.58 at this moment seems to be high.  ASSESSMENT: 1. Lithium overdose.  Her lithium level seems to be high and according     to the patient, she was not taking that much amount of lithium     because of nausea, vomiting.  Hence, probably, this maybe a chronic     lithium overdose as the patient has been on lithium for sometime. 2. Renal failure.  At this moment seems to be acute.  No previous     history of renal failure.  The only  blood work which she had was     from 2009 and during that time, her creatinine was 0.9.  The     patient also denies any history of kidney stone.  Hence probably,     this may be acute.  Since, the patient can cause interstitial     nephritis probably that will contribute.  However, with nausea or     vomiting, possibly also we may be dealing with a prerenal syndrome,     i.e. dehydration. 3. History of hypertension, blood pressure seems to be reasonable. 4. History of bipolar disorder. 5. History of depression, status post suicidal ideation from before.     At this moment, she denies. 6. History of gastroesophageal reflux disease. 7. History of coronary artery disease. 8. History of cerebrovascular accident. 9. History of irregular heart beat.  RECOMMENDATIONS:  We will start her on normal saline at 125 mL per hour. After adequately hydrated, we will use Lasix.  Because of high lithium level and worsening of renal failure, we will consider dialyzing her today.  We will check her lithium level and will do ultrasound of the kidneys.  If her renal function improved progressively, we will hold the dialysis, but for now we will continue and also we will monitor the lithium level.     Jorja Loa, M.D.     BB/MEDQ  D:  03/27/2010  T:  03/28/2010  Job:  [id number]  Electronically Signed by Jorja Loa M.D. on 04/27/2010 01:50:29 PM

## 2010-06-01 ENCOUNTER — Emergency Department (HOSPITAL_COMMUNITY)
Admission: EM | Admit: 2010-06-01 | Discharge: 2010-06-01 | Disposition: A | Discharge status: Home / Self Care | Payer: Medicaid Other | Attending: Emergency Medicine | Admitting: Emergency Medicine

## 2010-06-01 DIAGNOSIS — Z79899 Other long term (current) drug therapy: Secondary | ICD-10-CM | POA: Insufficient documentation

## 2010-06-01 DIAGNOSIS — R040 Epistaxis: Secondary | ICD-10-CM | POA: Insufficient documentation

## 2010-06-01 DIAGNOSIS — K219 Gastro-esophageal reflux disease without esophagitis: Secondary | ICD-10-CM | POA: Insufficient documentation

## 2010-06-01 DIAGNOSIS — I10 Essential (primary) hypertension: Secondary | ICD-10-CM | POA: Insufficient documentation

## 2010-06-01 DIAGNOSIS — F319 Bipolar disorder, unspecified: Secondary | ICD-10-CM | POA: Insufficient documentation

## 2010-06-15 ENCOUNTER — Emergency Department (HOSPITAL_COMMUNITY)
Admission: EM | Admit: 2010-06-15 | Discharge: 2010-06-16 | Disposition: A | Discharge status: Home / Self Care | Payer: Medicaid Other | Attending: Emergency Medicine | Admitting: Emergency Medicine

## 2010-06-15 DIAGNOSIS — K219 Gastro-esophageal reflux disease without esophagitis: Secondary | ICD-10-CM | POA: Insufficient documentation

## 2010-06-15 DIAGNOSIS — I10 Essential (primary) hypertension: Secondary | ICD-10-CM | POA: Insufficient documentation

## 2010-06-15 DIAGNOSIS — R040 Epistaxis: Secondary | ICD-10-CM | POA: Insufficient documentation

## 2010-06-15 DIAGNOSIS — Z79899 Other long term (current) drug therapy: Secondary | ICD-10-CM | POA: Insufficient documentation

## 2010-06-16 LAB — CBC
HCT: 41.4 % (ref 36.0–46.0)
Hemoglobin: 14.1 g/dL (ref 12.0–15.0)
MCHC: 34.1 g/dL (ref 30.0–36.0)
RBC: 4.57 MIL/uL (ref 3.87–5.11)
WBC: 11.8 10*3/uL — ABNORMAL HIGH (ref 4.0–10.5)

## 2010-06-16 LAB — PROTIME-INR
INR: 1.05 (ref 0.00–1.49)
Prothrombin Time: 13.9 seconds (ref 11.6–15.2)

## 2010-07-10 NOTE — Discharge Summary (Signed)
[place]. [facility]  Patient:    [name], [name]                     MRN: [id number] Adm. Date:  [id number] Disc. Date: [id number] Attending:  Nelta Numbers Dictator:   Delton See, P.A.                           Discharge Summary  NO DICTATION DD:  08/14/99 TD:  08/15/99 Job: [id number]

## 2010-07-10 NOTE — Op Note (Signed)
   NAME:  [name], [name]                        ACCOUNT NO.:  [id number]   MEDICAL RECORD NO.:  [id number]                   PATIENT TYPE:  AMB   LOCATION:  ENDO                                 [facility]ACILITY[facility]  [facility]   PHYSICIAN:  Georgiana Spinner, M.D.                 DATE OF BIRTH:  May 16, 1950   DATE OF PROCEDURE:  DATE OF DISCHARGE:                                 OPERATIVE REPORT   PROCEDURE:  Upper endoscopy.   INDICATIONS FOR PROCEDURE:  Abdominal pain.   ANESTHESIA:  Demerol 90, Versed 9 mg.   DESCRIPTION OF PROCEDURE:  With the patient mildly sedated in the left  lateral decubitus position, the Olympus videoscopic endoscope was inserted  in the mouth and passed under direct vision through the esophagus. The  distal esophagus was approached and there was one small area possibly of  Barrett's but could have been normal variant. I could not get the area to  insufflate enough so we photographed and biopsied it. We entered into the  stomach. The fundus, body, antrum showed some mild erythema which may be  again a normal variant but biopsied to rule out gastritis. The duodenal bulb  and second portion of the duodenum appeared normal. From this point, the  endoscope was slowly withdrawn taking circumferential views of the duodenal  mucosa until the endoscope was then pulled back in the stomach, placed in  retroflexion to view the stomach from below. The endoscope was then  straightened and withdrawn taking circumferential views of the remaining  gastric and esophageal mucosa. The patient's vital signs and pulse oximeter  remained stable. The patient tolerated the procedure well without apparent  complications.   FINDINGS:  Changes of possible mild gastritis biopsied. Question of short  segment Barrett's esophagus versus normal variant biopsied. Await biopsy  report. The patient will call me for results and followup with me in as an  outpatient. Proceed to colonoscopy as  planned.                                                Georgiana Spinner, M.D.    GMO/MEDQ  D:  02/05/2002  T:  02/05/2002  Job:  [id number]   cc:   Patrica Duel, M.D.  [address]  [place]  [place] [address]  Fax: [phone]

## 2010-07-10 NOTE — Discharge Summary (Signed)
[place]. [facility]  Patient:    [name], [name]                     MRN: [id number] Adm. Date:  [id number] Disc. Date: [id number] Attending:  Nelta Numbers Dictator:   Delton See, P.A. CC:         Patrica Duel, M.D.,[address] [place], [place] [address]                           Discharge Summary  BRIEF HISTORY:  [name] is a 60 year old female transferred from [facility] Emergency Room to [facility] for cardiac evaluation. She described a long history of two different types of chest pain with episodes up to ten times per day. She had seen her primary M.D. on the day of admission for edema that she had noticed yesterday. She was referred to [facility] Emergency Room for further evaluation of chest pain and later transferred to [facility].  PAST MEDICAL HISTORY:  Significant for a CVA in 1984 or 1985. She has gastroesophageal reflux disease, peptic ulcer disease, history of hypertension, elevated lipids. She is status post hysterectomy, status post cholecystectomy, status post incisional hernial repair.  ALLERGIES:  The patient is allergic to codeine, sulfa and gentamicin.  MEDICATIONS:  Toprol 75 mg daily. Prozac 20 mg daily.  Norgesic Forte and occasional BC powders.  SOCIAL HISTORY: The patient lives with her husband times 19 years in a mobile home. She works at a [employer]. She has not used tobacco in six to seven years.  She had previously smoked one to two packs per day for 20 years. She does not use alcohol or illegal drugs.  HOSPITAL COURSE:  As noted this patient was admitted to [facility] for further evaluation of chest pain.  Cardiac enzymes were found to be negative and the patient was scheduled for an exercise Cardiolite stress test. It was also recommended that the patient be placed on an H2 blocker for GI symptoms.  The patient was scheduled for a stress Cardiolite  on August 14, 1999; however, she was unable to reach her target heart rate of 147 beats per minute. She was therefore changed to an Adenosine Cardiolite and this study was completed without incident.  The final images were reviewed by Dr. Myrtis Ser. The ejection fraction was noted to be 64%. There was no definite significant abnormality. There was anterior wall attenuation and very subtle ischemia could not be ruled out; however, it was felt that this was a borderline call. Dr. Myrtis Ser felt that no further cardiac workup was indicated at this time.  The patient was discharged home later that evening in stable and improved condition.  LABORATORY DATA:  The labs in the chart at this time include a total CK which was within normal limits and a PTT which was 54. The patient had been initially treated with heparin.  EKG shows sinus bradycardia with nonspecific T wave abnormalities.  There is no x-ray report in the chart.  DISCHARGE MEDICATIONS: 1. Toprol XL 50 mg one and one half tablets daily. 2. Prozac 20 mg daily. 3. Axid 150 mg b.i.d. 4. Enteric coated aspirin one each day.  ACTIVITY:  Was to be as tolerated.  DIET: The patient is told to stay on a low salt, low fat diet.  FOLLOW-UP: She is to follow up with Dr. Nobie Putnam in one to two  weeks. She was to see Dr. Eden Emms only as needed.  PROBLEM LIST AT TIME OF DISCHARGE: 1. Chest pain with negative cardiac enzymes. 2. Adenosine Cardiolite performed August 14, 1999. Please see results as noted    above.  No further workup indicated at this time. 3. History of peptic ulcer and gastroesophageal reflux disease with empiric    treatment initiated this admission. 4. History of hypertension. 5. History of elevated lipids. 6. History of remote cerebrovascular accident. 7. Status post multiple surgeries. DD:  08/14/99 TD:  08/15/99 Job: [id number]

## 2010-07-10 NOTE — H&P (Signed)
NAMEMALITA, [name]:  [id number]   MEDICAL RECORD NO.:  [id number]                   PATIENT TYPE:  INP   LOCATION:  2031                                 FACILITY:  [facility]   PHYSICIAN:  Willa Rough, M.D. [facility]              DATE OF BIRTH:  03/14/1950   DATE OF ADMISSION:  12/22/2001  DATE OF DISCHARGE:                                HISTORY & PHYSICAL   HISTORY OF PRESENT ILLNESS:  The patient is seen in the emergency room at  [facility] for chest pain.  She had a significant injury to her right flank when  falling off a ladder at work in the past.  She has had right lower chest  pain.  Today she had some nausea and diaphoresis that improved, and then she  had some chest pain.  She came to the [facility] Emergency Room.  The  emergency room doctors here felt that she should be admitted by cardiology.  When I was speaking with her, she mentioned that she did have a cardiology  appointment as an outpatient some time in November.  She could not remember  who it was with, but she did mention that it was not with the [facility].  However, I am on call, and I have been asked to help, and I am more then  willing to do so.  I do have an understanding that she has had difficulty  showing for some appointments, possibly at the [facility] practice in the past.  She feels this was due to problems with manic depression and severe stress  from family members having cancer, and a family member being suicidal.   ALLERGIES:  The patient is allergic to SULFA.   MEDICATIONS:  1. Toprol 25 mg b.i.d.  2. Lasix unknown dose.  3. Prozac 40 mg.  4. Lorcet 10/650 mg one tablet q.4-6h. p.r.n. pain.   PAST MEDICAL HISTORY:  See the complete list below in my final problem list.   SOCIAL HISTORY:  The patient does not smoke.  She does not drink alcohol.   FAMILY HISTORY:  There is no documented family history at a young age.   REVIEW OF SYMPTOMS:  The patient has not been  having any fevers or chills.  There has been no significant cough.  She has not had any significant  headaches or blurred vision.  She does have some right upper quadrant mild  abdominal discomfort that appears to be a chronic issue.  The remainder of  her review of systems is negative.   PHYSICAL EXAMINATION:  VITAL SIGNS:  In the emergency room her blood  pressure was 120/80, pulse is 52, respirations are 16, temperature is 96.7.  GENERAL:  The patient is in no distress.  She is not currently having chest  pain.  SKIN:  Warm.  It was slightly moist.  There were no obvious skin lesions.  CHEST:  Clear.  NECK:  No obvious bruits.  CARDIAC:  S1 with a S2, but no clicks or significant murmurs.  ABDOMEN:  Obese.  There is no significant tenderness.  She has no  significant peripheral edema.   LABORATORY DATA:  EKG reveals nonspecific ST changes.  Chest x-ray reveals  no active disease.  Blood work is still pending at this time.   PROBLEMS:  1. History of allergy to SULFA.  2. History of manic depressive disorder.  The patient says she is doing     well, and she is actually on only Prozac at this time.  3. Multiple family stresses.  Her husband has cancer and a family member is     suicidal.  4. Injury at work to her right lower flank, posteriorly and anteriorly, when     falling off a ladder in the past.  5. Episode today of nausea and vomiting, resolved, and then some chest     discomfort that has now resolved.  The etiology is not clear.  The     patient did have a Cardiolite scan in 2001, that questioned some breast     attenuation.  Current blood studies are pending.  EKG is nonspecific.  We     will proceed onward with cardiac enzymes.  6. A slow speech pattern.  The etiology is not clear to me.   PLAN:  1. Labs will be reviewed.  2. The patient is on aspirin and a beta blocker.  3. We will check a series of cardiac enzymes.  If they are negative, we will     proceed with a  Cardiolite scan.  If they are positive, heart     catheterization will have to be considered.  Because of the nonspecific     ST changes, if her hemoglobin and platelets are stable, Lovenox will be     started.                                               Willa Rough, M.D. [facility]    JK/MEDQ  D:  12/22/2001  T:  12/23/2001  Job:  [id number]   cc:   Patrica Duel, M.D.  [address]  [place]  [place] [address]  Fax: [phone]

## 2010-07-10 NOTE — Discharge Summary (Signed)
NAMEELINOR, [name]:  [id number]   MEDICAL RECORD NO.:  [id number]                   PATIENT TYPE:  INP   LOCATION:  2031                                 FACILITY:  [facility]   PHYSICIAN:  Joellyn Rued, P.A. [facility]              DATE OF BIRTH:  25-Feb-1950   DATE OF ADMISSION:  12/22/2001  DATE OF DISCHARGE:  12/24/2001                           DISCHARGE SUMMARY - REFERRING   HISTORY OF PRESENT ILLNESS:  The patient is a 60 year old white female  who  presented to [facility] emergency room for chest discomfort. She  injured her right flank when she fell off a ladder at work at some point in  the past. Today she developed nausea and diaphoresis which was followed by  chest discomfort, thus her presentation to the emergency room. The ER doctor  called and felt that she should be admitted by cardiology since she did have  an outpatient cardiology evaluation scheduled for some time in November, but  she was not sure who it was with. She also has a history of manic  depression, severe stress secondary to her spouse having  newly diagnosed  cancer, and a daughter being suicidal.   LABORATORY DATA:  Her CKs and troponins were negative for myocardial  infarction. Admission hemoglobin 13.9, hematocrit 41.1, normal indices,  platelet 210, WBCs 6.9. PT 13.8, PTT 30. Sodium 138, potassium 3.6, BUN 11,  creatinine 0.7. Normal LFTs, amylase 85, lipase 21. TSH 1.890.   A chest x-ray did not show any active  disease. An EKG showed sinus  bradycardia and nonspecific STT wave changes.   HOSPITAL COURSE:  The patient was admitted to the unit 2000 overnight. It  was noted that she continued to have chest pressure that has lasted hours  and days at a time, and she also complains of chronic right flank pain.  Enzymes and EKGs were negative for myocardial infarction.   It was felt that she should undergo stress testing. Adenosine Cardiolite was  performed on  December 24, 2001. She did not have any ischemia on imaging. Her  EF was greater than 60%. After review Dr. Andee Lineman felt that she could be  discharged home.   DISCHARGE DIAGNOSES:  1. Atypical chest discomfort.  2. Anxiety related to stress, history as previously.   DISCHARGE MEDICATIONS:  She was asked to continue her home medications.  These include:  1. Toprol XL 25 mg b.i.d.  2. Unknown dose of Lasix.  3. Prozac 40 mg q.d.  4. Lorcet 10/650 q.4-6h. p.r.n.    DISCHARGE INSTRUCTIONS:  She was asked to maintain a low salt, fat,  cholesterol diet and to arrange a followup appointment with Dr. Nobie Putnam.  Joellyn Rued, P.A. [facility]    EW/MEDQ  D:  12/24/2001  T:  12/25/2001  Job:  [id number]   cc:   Patrica Duel, M.D.  [address]  [place]  [place] [address]  Fax: [phone]

## 2010-07-10 NOTE — H&P (Signed)
[name], [name]:  [id number]   MEDICAL RECORD NO.:  [id number]          PATIENT TYPE:  EMS   LOCATION:  ED                            FACILITY:  [facility]   PHYSICIAN:  Madelin Rear. Sherwood Gambler, MD  DATE OF BIRTH:  02/28/1950   DATE OF ADMISSION:  03/31/2004  DATE OF DISCHARGE:  LH                                HISTORY & PHYSICAL   CHIEF COMPLAINT:  Overdose.   HISTORY OF PRESENT ILLNESS:  Patient voluntarily overdosed on about six to  twelve 10 mg amitriptyline 1-1/2 hours prior to emergency room visitation at  1558 hours.  She is depressed, readily admits that and took it as a suicidal  attempt.  She denied any other ingestion and a pill count performed by the  ER staff confirmed that she had correct amounts of remaining medication  based on dates.  She denies any discomfort except for some minimal reflux  symptomatology.   PAST MEDICAL HISTORY:  Psychiatric problem depression, gastroesophageal  reflux disease, coronary artery disease, hypertension, maintained on Toprol,  Prozac, Zyrtec, Elavil, Xanax, Prempro and Protonix.  She is notable  allergic to SULFA, CEFTIN and CODEINE.   SOCIAL HISTORY:  Married, lives at home, under great stress according to her  recently she had a very, very bad day which precipitated this event.   FAMILY HISTORY:  Noncontributory.   REVIEW OF SYSTEMS:  Review of systems is under HPI all else negative.   PHYSICAL EXAMINATION:  She is somewhat lethargic with no evidence nystagmus  and equal pupils.  Head/neck no JVD or adenopathy; neck is supple.  Chest  clear.  Cardiac exam regular rhythm without murmur, gallop, or rub.  Abdomen  soft, no organomegaly or masses.  Extremities no clubbing, cyanosis, edema.  Neurologic examination as above; no focal findings.   LABORATORY STUDIES:  Two-hour Tylenol level was not toxic.  Urine drug  screen was positive for benzodiazepines (prescribed), positive for opiates  (prescribed).  CBC:   Normal white count, normal H&H, platelet count normal,  no left shift on her differential.  Arterial blood gas revealed adequate  respirations with no hypercapnia.  Electrolytes minimal hyponatremia at 131  otherwise unrevealing.  No radiographs are available for review at the  present time.  Alcohol level is less than 5.  EKG showed no QRS widening or  QT prolongation.  She has a sinus bradycardia (on a beta blocker).   IMPRESSION:  Monopharmaceutical overdose with tricyclic antidepressant.  We  will monitor her due to her lethargy although she seems to have no  cardiovascular effect at the dose taken.  Another Tylenol level will also be  drawn at a 4-hour to 5-hour post-ingestion interval to make sure that she  does not have surreptitious acetaminophen usage as well.  Psychiatric  consultation will be sought in the morning when she is more alert.  Elopement precautions will be instituted and monitoring of her pulse  oximetry and neurologic status in the unit.      LJF/MEDQ  D:  03/31/2004  T:  03/31/2004  Job:  [id number]

## 2010-07-10 NOTE — Discharge Summary (Signed)
[name], [name]              ACCOUNT NO.:  [id number]   MEDICAL RECORD NO.:  [id number]          PATIENT TYPE:  INP   LOCATION:  A215                          FACILITY:  [facility]   PHYSICIAN:  Madelin Rear. Sherwood Gambler, MD  DATE OF BIRTH:  Jun 07, 1950   DATE OF ADMISSION:  03/31/2004  DATE OF DISCHARGE:  02/09/2006LH                                 DISCHARGE SUMMARY   DISCHARGE MEDICATIONS:  Not applicable.  The patient transferred for post  overdose.   DISCHARGE DIAGNOSES:  1.  Antidepressant overdose (tricyclic).  2.  Suicidal attempt.  3.  Depression.  4.  Gastroesophageal reflux disease.  5.  Hypertension.  6.  Coronary artery disease.   SUMMARY:  The patient was admitted post overdose of tricyclic  antidepressants.  She was admitted to the ICU, precautionary measure, after  6-12 10-mg (total of 120 approximate milligrams of amitriptyline) one and a  half hours prior to ER visitation.  Pill count performed by the ER staff  confirmed this.  She was admitted for observation.  A two-hour Tylenol level  was not toxic, and follow-up Tylenol levels were not toxic.  The patient  remained stable and was seen in consultation urgently by psychiatry and  subsequently transferred for admission to [facility].      LJF/MEDQ  D:  04/22/2004  T:  04/22/2004  Job:  [id number]

## 2010-07-10 NOTE — Discharge Summary (Signed)
[name], [name]:  [id number]   MEDICAL RECORD NO.:  [id number]          PATIENT TYPE:  IPS   LOCATION:  0306                          FACILITY:  [facility]   PHYSICIAN:  Geoffery Lyons, M.D.      DATE OF BIRTH:  1950/08/12   DATE OF ADMISSION:  04/02/2004  DATE OF DISCHARGE:  04/09/2004                                 DISCHARGE SUMMARY   CHIEF COMPLAINT AND PRESENT ILLNESS:  This was the first admission to [facility] for this 61 year old married white female.  History  of intentional overdose on Elavil, Lorcet.  Transferred from [facility].  Living home alone.  Timed it that way.  Did not want to be home anymore.  Conflict with daughter living with her.  Daughter had mental illness,  physical problems, lashes out, blaming her for things.  Reports mood swings,  being irritable, frustrated.   PAST PSYCHIATRIC HISTORY:  First time at [facility].  Seen at  [facility], Dr. Waunita Schooner.   ALCOHOL/DRUG HISTORY:  Denies the use or abuse of any substances.   PAST MEDICAL HISTORY:  Arrhythmias.   MEDICATIONS:  Toprol 50 mg per day, Prozac 40 mg per day, Zyrtec 10 mg  daily, Elavil.   PHYSICAL EXAMINATION:  Performed and failed to show any acute findings.   LABORATORY DATA:  Blood chemistry with glucose 112.  Liver enzymes within  normal limits.  TSH 3.319.   MENTAL STATUS EXAM:  Alert female.  Cooperative.  Good eye contact.  Speech  clear, a slight speech impediment.  Mood anxious, depressed.  Affect  congruent.  Thought processes logical, coherent and relevant.  No delusions.  No evidence of hallucinations.  Cognition was well-preserved.   ADMISSION DIAGNOSES:   AXIS I:  Major depression.   AXIS II:  No diagnosis.   AXIS III:  Coronary artery disease.   AXIS IV:  Moderate.   AXIS V:  Global Assessment of Functioning upon admission 35; highest Global  Assessment of Functioning in the last year 65.   HOSPITAL COURSE:  She was admitted and started in individual and group  psychotherapy.  She was maintained on the Toprol XL 50 mg per day, Prozac 40  mg per day, Xanax 0.5 mg four times a day as needed, Lorcet 7.5/500 mg every  six hours as needed for pain.  Prozac was decreased to 20 mg.  She was  started on Effexor XR 37.5 mg.  She endorsed she had been having a hard time  with her mood.  Had been treated for depression before, Paxil then Prozac.  They apparently quit working.  Endorsed she had had mood fluctuations,  episodes of increased energy, racing thoughts, decreased sleep, unable to  function.  She experiences one thought after the other, very overwhelmed.  Says that the episodes will last for days.  Complained that her physician  has not changed the medication despite of her complaining of the mood  swings.  Endorsed multiple problems but endorsed wanting to do better.  We  went ahead and added lithium 300  mg twice a day.  She was given some  trazodone for sleep.  She experienced some swelling of her hands and some  allergic reaction, increased heart rate.  Endorsed that she used to take  Lasix before and she was not getting it, so it was ordered.  Also difficulty  with sleep, so we added Seroquel 50 mg, that was increased to 75 mg.  Because of the swelling and the skin reaction, lithium was held but she felt  a big difference when she was taking the lithium, so we went ahead and got  lithium restarted.  Lithium was placed back at 300 mg twice a day.  She was  taking Seroquel 75 mg at bedtime.  She continued to stabilize.  On February  16th, she was much better.  We were going to have a session with the husband  and the daughter but the daughter could not come due to childcare issues.  They discussed their issues. She recognized the need for her to communicate.  Felt that the medications were working for her.  Aware that they needed to  address issues of family communication as a  whole, stress management.  Endorsed no suicidal or homicidal ideation.  She was very insightful.  Had  much better coping skills.  We went ahead and discharged to outpatient  follow-up.   DISCHARGE DIAGNOSES:   AXIS I:  Bipolar disorder, depressed.   AXIS II:  No diagnosis.   AXIS III:  Coronary artery disease.   AXIS IV:  Moderate.   AXIS V:  Global Assessment of Functioning upon discharge 55-60.   DISCHARGE MEDICATIONS:  1.  Toprol XL 50 mg per day.  2.  MiraLax 17 grams daily.  3.  Effexor XR 37.5 mg in the morning.  4.  Lithium 300 mg twice a day.  5.  Xanax 0.5 mg twice a day and at bedtime.  6.  Seroquel 25 mg, 3 at night.  7.  Lasix 20 mg daily.  8.  Vicoprofen 1 every six hours as needed.   FOLLOW UP:  [facility], Dr. Lolly Mustache, and [facility].      IL/MEDQ  D:  05/06/2004  T:  05/07/2004  Job:  [id number]

## 2010-07-10 NOTE — Op Note (Signed)
   NAME:  [name], [name]                        ACCOUNT NO.:  [id number]   MEDICAL RECORD NO.:  [id number]                   PATIENT TYPE:  AMB   LOCATION:  ENDO                                 FACILITY:  [facility]   PHYSICIAN:  Georgiana Spinner, M.D.                 DATE OF BIRTH:  [birth year]   DATE OF PROCEDURE:  02/05/2002  DATE OF DISCHARGE:                                 OPERATIVE REPORT   PROCEDURE:  Colonoscopy.   INDICATIONS:  Abdominal pain.   ANESTHESIA:  Demerol 10, Versed 1mg .   DESCRIPTION OF PROCEDURE:  With the patient mildly sedated in the left  lateral decubitus position, the Olympus video colonoscope was inserted in  the rectum and passed under direct visualization to the cecum, identified by  the ileocecal valve and appendiceal orifice. We entered  into the terminal  ileum which appeared to be mildly erythematous and this was photographed and  biopsies were taken.   The endoscope was withdrawn, taking circumferential views of the remaining  colonic mucosa, stopping only in the rectum which appeared normal on the  rectum retroflex view. The endoscope was straightened and withdrawn.   The patient's vital signs on pulse oximetry remained stable. The patient  tolerated the procedure well without apparent complications.   FINDINGS:  Essentially negative examination other than odd erythema of the  terminal ileum, biopsied.   PLAN:  Await biopsy report. The patient will call in for the results and  follow up with me as an outpatient.                                               Georgiana Spinner, M.D.    GMO/MEDQ  D:  02/05/2002  T:  02/05/2002  Job:  [id number]

## 2010-07-10 NOTE — Op Note (Signed)
[facility]  Patient:    [name], [name] Visit Number: [id number] MRN: [id number]          Service Type: END Location: DAY Attending Physician:  Jonathon Bellows Dictated by:   Roetta Sessions, M.D. Proc. Date: 04/24/01 Admit Date:  04/24/2001   CC:         Patrica Duel, M.D.   Operative Report  PROCEDURE:  Diagnostic esophagogastroduodenoscopy.  ENDOSCOPIST:  Roetta Sessions, M.D.  INDICATIONS:  Chronic right upper quadrant abdominal pain.  She has been extensively evaluated.  Recent small bowel follow through demonstrated dilated stomach, retained food material in the stomach, normal small bowel.  The EGD is now being done to further evaluate her symptoms and the recent abnormalities seen on upper GI and small bowel follow through.  This approach has been discussed with Ms. Westra previously and today again at the bedside. Potential risks, benefits and alternatives have been reviewed and questions answered.  She is agreeable.  Please see the dictated office note for more information.  DESCRIPTION OF PROCEDURE:  The O2 saturation, blood pressure, pulse and respirations were monitored throughout the entire procedure.  Conscious sedation with Versed 3 mg IV and Demerol 75 mg IV in divided doses.  The instrument was the Olympus video gastroscope.  Examination of the tubular esophagus revealed no mucosal abnormalities.  The EG junction was easily traversed and entering the stomach, the gastric cavity was emptied and insufflated well with air.  Thorough examination of the gastric mucosa including retroflexed view of the proximal stomach, esophagus and gastric junction revealed multiple antral erosions.  The remainder of the gastric mucosa appeared normal.  The stomach did not appear unusually dilated.  It was very compliant.  The pylorus was very patent and easily traversed.  Duodenum, bulb and second portion and the third portion were well seen.   There was scarring and slight deformity of the bulb (site of prior previous duodenal bulb or ulcerative colitis) but mucosa appeared normal through D3).  Diagnostic maneuvers performed:  None.  The patient tolerated the procedure well and was reactive in endoscopy.  IMPRESSION: 1. Normal esophagus. 2. Antral erosions but otherwise normal appearing stomach. 3. Scar in bulb, otherwise normal D1-D3.  RECOMMENDATIONS:  This lady was previously known to have a duodenal bulb ulcer.  CLO was negative.  We will double check her Helicobacter pylori status with H. pylori serologies.  Next we will proceed with a solid phase nuclear gastric emptying study.  Further recommendations to follow. Dictated by:   Roetta Sessions, M.D. Attending Physician:  Jonathon Bellows DD:  04/24/01 TD:  04/24/01 Job: [id number]

## 2010-07-10 NOTE — Consult Note (Signed)
[name], [name]              ACCOUNT NO.:  [id number]   MEDICAL RECORD NO.:  [id number]          PATIENT TYPE:  IPS   LOCATION:  0306                          FACILITY:  [facility]   PHYSICIAN:  Hollice Espy, M.D.DATE OF BIRTH:  [birth year]   DATE OF CONSULTATION:  04/07/2004  DATE OF DISCHARGE:                                   CONSULTATION   REASON FOR CONSULTATION:  Hand swelling.   HISTORY OF PRESENT ILLNESS:  The patient is a 60 year old white female with  a past medical history of depression, hypertension and fluid retention, who  was admitted on April 02, 2004 to [facility] after she had an  intentional overdose of Elavil.  She was admitted because she remained  suicidal and felt she was undergoing a lot of stress at the time.  She has  been started on treatments, including lithium and Xanax and Effexor, when it  was noted on April 05, 2004 that patient was complaining of some swelling  most noticeably in her bilateral hands.  Apparently, the patient has had  these episodes before.  They usually occur in her bilateral hands and lower  extremities as well.  During that time, she normal takes Lasix 20 mg daily  as needed when these occur for a few days and, once they resolve, she is  able to stop this.  Once this episode occurred, the patient had no other  issues.  She had no episodes of shortness of breath although, on the early  morning hours of April 06, 2004, she started having, what was felt to be,  a medication reaction with tachycardia and some chest pain.  An EKG was  done, which was found to be normal and this was felt to be more of a  medication dosage issue.  Following that episode, her lithium was held and  she was started on Seroquel.  Behavioral Hospitalists were consulted to  evaluate her for her hand swelling. The patient was given one dose of Lasix  on the evening of April 06, 2004 and she tells me today that she is  feeling better.  The  swelling in her hands and legs have gone down.  She  denies any other complaints such as chest pain, visual changes,  palpitations, shortness of breath, wheeze, cough, abdominal pain, hematuria,  dysuria, constipation, diarrhea, focal extremity weakness, pain or numbness.  She does complain of a headache today, which is somewhat to her previous  migraines but it is getting better after she took some Tylenol.   PAST MEDICAL HISTORY:  Depression, anxiety, migraine headaches and  peripheral edema.   MEDICATIONS:  Currently, the patient, while she is here at [facility], is on Toprol XL 50 mg daily and MiraLax 17 grams daily, Effexor 37.5  mg daily, lithium (which is on hold), trazodone (which is on hold), Xanax  0.5 mg t.i.d., Seroquel 50 mg at bedtime., Tylenol 650 mg q.4h. p.r.n. and  p.r.n. milk of magnesium and Dulcolax, Vicodin and Cepacol.   ALLERGIES:  She has reportedly allergies to SULFA and CODEINE.   FAMILY HISTORY:  Suicide attempts by her daughter.  She does not take any  drugs, smoke or take alcohol.   PHYSICAL EXAMINATION:  VITAL SIGNS:  The patient is afebrile, heart rate 82,  respirations 20, O2 sat 98% on room air.  GENERAL:  The patient appears alert and oriented x 3.  No apparent distress.  HEENT:  Normocephalic and atraumatic.  Her mucous membranes are moist.  She  has no carotid bruits.  HEART:  Regular rate and rhythm.  S1 and S2.  LUNGS:  Clear to auscultation bilaterally.  ABDOMEN:  Soft, obese and nontender.  Positive bowel sounds.  EXTREMITIES:  Her hands show a little bit of swelling involving the dorsum  of her hands as well as the lower extremities from approximately starting  around the ankle area.  She is about a 1-2+ pitting edema.  At best, she has  good peripheral pulses.   LABORATORY DATA:  Sodium 137, potassium 2.9, chloride 105, bicarb 25, BUN 9,  creatinine 0.9, glucose 136, calcium 9.2.   ASSESSMENT AND PLAN:  Peripheral edema.  The  patient has no signs of  congestive heart failure.  This does not appear to be so.  Feels that likely  she simply just has some fluid retention and would favor continuing her  Lasix 20 mg on a daily basis for the next several days.  I would return her  to a p.r.n. Lasix dose after she is discharged home or after four days,  whichever comes first.  In regards to her psychiatric issues, this will be  managed by Behavioral Health.      SKK/MEDQ  D:  04/07/2004  T:  04/08/2004  Job:  [id number]

## 2010-11-24 LAB — BASIC METABOLIC PANEL
BUN: 8
Chloride: 107
GFR calc Af Amer: >60
GFR calc non Af Amer: 59 — ABNORMAL LOW
Potassium: 4.2
Sodium: 138

## 2010-11-24 LAB — URINALYSIS, ROUTINE W REFLEX MICROSCOPIC
Bilirubin Urine: NEGATIVE
Glucose, UA: NEGATIVE
Ketones, ur: NEGATIVE
Nitrite: POSITIVE — AB
Protein, ur: NEGATIVE
Specific Gravity, Urine: 1.01
Urobilinogen, UA: 0.2
pH: 6

## 2010-11-24 LAB — CBC
HCT: 39.3
Hemoglobin: 13.4
MCHC: 34.2
MCV: 95
Platelets: 298
RBC: 4.13
RDW: 12.8
WBC: 7.6

## 2010-11-24 LAB — POCT CARDIAC MARKERS
CKMB, poc: <1 — ABNORMAL LOW
Myoglobin, poc: 65.8
Troponin i, poc: <0.05

## 2010-11-24 LAB — DIFFERENTIAL
Basophils Absolute: 0.1
Basophils Relative: 1
Eosinophils Absolute: 0.4
Eosinophils Relative: 5
Lymphocytes Relative: 34
Lymphs Abs: 2.6
Monocytes Absolute: 0.5
Monocytes Relative: 7
Neutro Abs: 4.1
Neutrophils Relative %: 54

## 2010-11-24 LAB — BASIC METABOLIC PANEL WITH GFR
CO2: 27
Calcium: 9.5
Creatinine, Ser: 0.98
Glucose, Bld: 118 — ABNORMAL HIGH

## 2010-11-24 LAB — URINE MICROSCOPIC-ADD ON

## 2010-11-24 LAB — D-DIMER, QUANTITATIVE: D-Dimer, Quant: 0.5 — ABNORMAL HIGH

## 2011-02-11 ENCOUNTER — Other Ambulatory Visit (HOSPITAL_COMMUNITY): Payer: Self-pay | Admitting: Family Medicine

## 2011-02-11 DIAGNOSIS — Z139 Encounter for screening, unspecified: Secondary | ICD-10-CM

## 2011-02-19 ENCOUNTER — Ambulatory Visit (HOSPITAL_COMMUNITY): Payer: Medicaid Other

## 2011-02-26 ENCOUNTER — Ambulatory Visit (HOSPITAL_COMMUNITY)
Admission: RE | Admit: 2011-02-26 | Discharge: 2011-02-26 | Disposition: A | Discharge status: Home / Self Care | Payer: PRIVATE HEALTH INSURANCE | Source: Ambulatory Visit | Attending: Family Medicine | Admitting: Family Medicine

## 2011-02-26 DIAGNOSIS — Z139 Encounter for screening, unspecified: Secondary | ICD-10-CM

## 2011-02-26 DIAGNOSIS — Z1231 Encounter for screening mammogram for malignant neoplasm of breast: Secondary | ICD-10-CM | POA: Insufficient documentation

## 2012-10-10 ENCOUNTER — Other Ambulatory Visit (HOSPITAL_COMMUNITY): Payer: Self-pay | Admitting: Nurse Practitioner

## 2012-10-10 DIAGNOSIS — Z139 Encounter for screening, unspecified: Secondary | ICD-10-CM

## 2012-10-17 ENCOUNTER — Ambulatory Visit (HOSPITAL_COMMUNITY): Payer: Self-pay

## 2012-10-20 ENCOUNTER — Ambulatory Visit (HOSPITAL_COMMUNITY)
Admission: RE | Admit: 2012-10-20 | Discharge: 2012-10-20 | Disposition: A | Discharge status: Home / Self Care | Payer: PRIVATE HEALTH INSURANCE | Source: Ambulatory Visit | Attending: Nurse Practitioner | Admitting: Nurse Practitioner

## 2012-10-20 DIAGNOSIS — Z1231 Encounter for screening mammogram for malignant neoplasm of breast: Secondary | ICD-10-CM | POA: Insufficient documentation

## 2012-10-20 DIAGNOSIS — Z139 Encounter for screening, unspecified: Secondary | ICD-10-CM

## 2013-09-19 ENCOUNTER — Ambulatory Visit (HOSPITAL_COMMUNITY)
Admission: RE | Admit: 2013-09-19 | Discharge: 2013-09-19 | Disposition: A | Discharge status: Home / Self Care | Payer: Medicare HMO | Source: Ambulatory Visit | Attending: Family Medicine | Admitting: Family Medicine

## 2013-09-19 ENCOUNTER — Other Ambulatory Visit (HOSPITAL_COMMUNITY): Payer: Self-pay | Admitting: Family Medicine

## 2013-09-19 DIAGNOSIS — M545 Low back pain, unspecified: Secondary | ICD-10-CM | POA: Diagnosis present

## 2013-09-19 DIAGNOSIS — M5137 Other intervertebral disc degeneration, lumbosacral region: Secondary | ICD-10-CM | POA: Insufficient documentation

## 2013-09-19 DIAGNOSIS — M5136 Other intervertebral disc degeneration, lumbar region: Secondary | ICD-10-CM

## 2013-09-19 DIAGNOSIS — M51379 Other intervertebral disc degeneration, lumbosacral region without mention of lumbar back pain or lower extremity pain: Secondary | ICD-10-CM | POA: Insufficient documentation

## 2016-08-06 ENCOUNTER — Ambulatory Visit (HOSPITAL_COMMUNITY)
Admission: RE | Admit: 2016-08-06 | Discharge: 2016-08-06 | Disposition: A | Discharge status: Home / Self Care | Payer: Medicare HMO | Source: Ambulatory Visit | Attending: Family Medicine | Admitting: Family Medicine

## 2016-08-06 ENCOUNTER — Other Ambulatory Visit (HOSPITAL_COMMUNITY): Payer: Self-pay | Admitting: Family Medicine

## 2016-08-06 DIAGNOSIS — M47896 Other spondylosis, lumbar region: Secondary | ICD-10-CM | POA: Insufficient documentation

## 2016-08-06 DIAGNOSIS — I700 Atherosclerosis of aorta: Secondary | ICD-10-CM | POA: Insufficient documentation

## 2016-08-06 DIAGNOSIS — M545 Low back pain: Secondary | ICD-10-CM

## 2016-11-29 ENCOUNTER — Inpatient Hospital Stay (HOSPITAL_COMMUNITY)
Admission: EM | Admit: 2016-11-29 | Discharge: 2016-12-03 | DRG: 480 | Disposition: A | Discharge status: Skilled Nursing Facility | Payer: Medicare HMO | Attending: Internal Medicine | Admitting: Internal Medicine

## 2016-11-29 ENCOUNTER — Emergency Department (HOSPITAL_COMMUNITY): Payer: Medicare HMO

## 2016-11-29 ENCOUNTER — Encounter (HOSPITAL_COMMUNITY): Payer: Self-pay | Admitting: *Deleted

## 2016-11-29 DIAGNOSIS — S72402A Unspecified fracture of lower end of left femur, initial encounter for closed fracture: Secondary | ICD-10-CM | POA: Diagnosis present

## 2016-11-29 DIAGNOSIS — E119 Type 2 diabetes mellitus without complications: Secondary | ICD-10-CM | POA: Diagnosis present

## 2016-11-29 DIAGNOSIS — Z87891 Personal history of nicotine dependence: Secondary | ICD-10-CM | POA: Diagnosis not present

## 2016-11-29 DIAGNOSIS — Y92002 Bathroom of unspecified non-institutional (private) residence single-family (private) house as the place of occurrence of the external cause: Secondary | ICD-10-CM

## 2016-11-29 DIAGNOSIS — I10 Essential (primary) hypertension: Secondary | ICD-10-CM | POA: Diagnosis present

## 2016-11-29 DIAGNOSIS — M1712 Unilateral primary osteoarthritis, left knee: Secondary | ICD-10-CM | POA: Diagnosis present

## 2016-11-29 DIAGNOSIS — W01198A Fall on same level from slipping, tripping and stumbling with subsequent striking against other object, initial encounter: Secondary | ICD-10-CM | POA: Diagnosis present

## 2016-11-29 DIAGNOSIS — S728X2A Other fracture of left femur, initial encounter for closed fracture: Secondary | ICD-10-CM | POA: Diagnosis not present

## 2016-11-29 DIAGNOSIS — Z6841 Body Mass Index (BMI) 40.0 and over, adult: Secondary | ICD-10-CM

## 2016-11-29 DIAGNOSIS — S72492A Other fracture of lower end of left femur, initial encounter for closed fracture: Secondary | ICD-10-CM | POA: Diagnosis present

## 2016-11-29 DIAGNOSIS — Z7982 Long term (current) use of aspirin: Secondary | ICD-10-CM | POA: Diagnosis not present

## 2016-11-29 DIAGNOSIS — E785 Hyperlipidemia, unspecified: Secondary | ICD-10-CM | POA: Diagnosis present

## 2016-11-29 DIAGNOSIS — Z791 Long term (current) use of non-steroidal anti-inflammatories (NSAID): Secondary | ICD-10-CM | POA: Diagnosis not present

## 2016-11-29 DIAGNOSIS — J9601 Acute respiratory failure with hypoxia: Secondary | ICD-10-CM | POA: Diagnosis not present

## 2016-11-29 DIAGNOSIS — F319 Bipolar disorder, unspecified: Secondary | ICD-10-CM | POA: Diagnosis present

## 2016-11-29 DIAGNOSIS — J189 Pneumonia, unspecified organism: Secondary | ICD-10-CM | POA: Diagnosis not present

## 2016-11-29 DIAGNOSIS — Z23 Encounter for immunization: Secondary | ICD-10-CM | POA: Diagnosis present

## 2016-11-29 DIAGNOSIS — S72092K Other fracture of head and neck of left femur, subsequent encounter for closed fracture with nonunion: Secondary | ICD-10-CM | POA: Diagnosis not present

## 2016-11-29 DIAGNOSIS — R0902 Hypoxemia: Secondary | ICD-10-CM

## 2016-11-29 DIAGNOSIS — Z794 Long term (current) use of insulin: Secondary | ICD-10-CM | POA: Diagnosis not present

## 2016-11-29 DIAGNOSIS — Z09 Encounter for follow-up examination after completed treatment for conditions other than malignant neoplasm: Secondary | ICD-10-CM

## 2016-11-29 HISTORY — DX: Type 2 diabetes mellitus without complications: E11.9

## 2016-11-29 HISTORY — DX: Bipolar disorder, unspecified: F31.9

## 2016-11-29 LAB — CBC WITH DIFFERENTIAL/PLATELET
Basophils Absolute: 0.1 10*3/uL (ref 0.0–0.1)
Basophils Relative: 0 %
EOS ABS: 0.1 10*3/uL (ref 0.0–0.7)
EOS PCT: 1 %
HCT: 42 % (ref 36.0–46.0)
Hemoglobin: 13.9 g/dL (ref 12.0–15.0)
LYMPHS ABS: 2 10*3/uL (ref 0.7–4.0)
LYMPHS PCT: 15 %
MCH: 28.7 pg (ref 26.0–34.0)
MCHC: 33.1 g/dL (ref 30.0–36.0)
MCV: 86.6 fL (ref 78.0–100.0)
MONOS PCT: 5 %
Monocytes Absolute: 0.7 10*3/uL (ref 0.1–1.0)
Neutro Abs: 10.8 10*3/uL — ABNORMAL HIGH (ref 1.7–7.7)
Neutrophils Relative %: 79 %
Platelets: 283 10*3/uL (ref 150–400)
RBC: 4.85 MIL/uL (ref 3.87–5.11)
RDW: 14.1 % (ref 11.5–15.5)
WBC: 13.7 10*3/uL — AB (ref 4.0–10.5)

## 2016-11-29 LAB — CBC
HEMATOCRIT: 39.9 % (ref 36.0–46.0)
HEMOGLOBIN: 13.4 g/dL (ref 12.0–15.0)
MCH: 28.5 pg (ref 26.0–34.0)
MCHC: 33.6 g/dL (ref 30.0–36.0)
MCV: 84.9 fL (ref 78.0–100.0)
PLATELETS: 260 10*3/uL (ref 150–400)
RBC: 4.7 MIL/uL (ref 3.87–5.11)
RDW: 14 % (ref 11.5–15.5)
WBC: 12.1 10*3/uL — AB (ref 4.0–10.5)

## 2016-11-29 LAB — COMPREHENSIVE METABOLIC PANEL
ALK PHOS: 85 U/L (ref 38–126)
ALT: 23 U/L (ref 14–54)
AST: 24 U/L (ref 15–41)
Albumin: 3.9 g/dL (ref 3.5–5.0)
Anion gap: 10 (ref 5–15)
BILIRUBIN TOTAL: 0.6 mg/dL (ref 0.3–1.2)
BUN: 12 mg/dL (ref 6–20)
CO2: 25 mmol/L (ref 22–32)
CREATININE: 0.93 mg/dL (ref 0.44–1.00)
Calcium: 9.2 mg/dL (ref 8.9–10.3)
Chloride: 103 mmol/L (ref 101–111)
GFR calc Af Amer: >60 mL/min (ref >60)
GLUCOSE: 135 mg/dL — AB (ref 65–99)
POTASSIUM: 3.7 mmol/L (ref 3.5–5.1)
Sodium: 138 mmol/L (ref 135–145)
TOTAL PROTEIN: 7.3 g/dL (ref 6.5–8.1)

## 2016-11-29 LAB — CREATININE, SERUM
Creatinine, Ser: 0.89 mg/dL (ref 0.44–1.00)
GFR calc non Af Amer: >60 mL/min (ref >60)

## 2016-11-29 LAB — HEMOGLOBIN A1C
HEMOGLOBIN A1C: 6.2 % — AB (ref 4.8–5.6)
MEAN PLASMA GLUCOSE: 131.24 mg/dL

## 2016-11-29 LAB — GLUCOSE, CAPILLARY: Glucose-Capillary: 161 mg/dL — ABNORMAL HIGH (ref 65–99)

## 2016-11-29 MED ORDER — LISINOPRIL 20 MG PO TABS
20.0000 mg | ORAL_TABLET | Freq: Every day | ORAL | Status: DC
  Administered 2016-11-29 – 2016-12-01 (×2): 20 mg via ORAL
  Filled 2016-11-29 (×2): qty 1

## 2016-11-29 MED ORDER — ACETAMINOPHEN 325 MG PO TABS: 650.0000 mg | ORAL_TABLET | Freq: Four times a day (QID) | ORAL | Status: DC | PRN

## 2016-11-29 MED ORDER — PRAVASTATIN SODIUM 20 MG PO TABS
20.0000 mg | ORAL_TABLET | Freq: Every day | ORAL | Status: DC
  Administered 2016-11-30 – 2016-12-02 (×3): 20 mg via ORAL
  Filled 2016-11-29 (×3): qty 1

## 2016-11-29 MED ORDER — MORPHINE SULFATE (PF) 2 MG/ML IV SOLN
0.5000 mg | INTRAVENOUS | Status: DC | PRN
  Administered 2016-11-29: 0.5 mg via INTRAVENOUS
  Filled 2016-11-29: qty 1

## 2016-11-29 MED ORDER — INSULIN ASPART 100 UNIT/ML ~~LOC~~ SOLN
0.0000 [IU] | Freq: Three times a day (TID) | SUBCUTANEOUS | Status: DC
  Administered 2016-11-30 – 2016-12-02 (×7): 2 [IU] via SUBCUTANEOUS

## 2016-11-29 MED ORDER — ACETAMINOPHEN 650 MG RE SUPP: 650.0000 mg | Freq: Four times a day (QID) | RECTAL | Status: DC | PRN

## 2016-11-29 MED ORDER — ALPRAZOLAM 0.5 MG PO TABS
0.5000 mg | ORAL_TABLET | Freq: Every day | ORAL | Status: DC
  Administered 2016-12-02 – 2016-12-03 (×2): 0.5 mg via ORAL
  Filled 2016-11-29 (×3): qty 1

## 2016-11-29 MED ORDER — ASPIRIN EC 81 MG PO TBEC
81.0000 mg | DELAYED_RELEASE_TABLET | Freq: Every day | ORAL | Status: DC
  Administered 2016-12-01 – 2016-12-03 (×3): 81 mg via ORAL
  Filled 2016-11-29 (×3): qty 1

## 2016-11-29 MED ORDER — HEPARIN SODIUM (PORCINE) 5000 UNIT/ML IJ SOLN
5000.0000 [IU] | Freq: Three times a day (TID) | INTRAMUSCULAR | Status: DC
  Administered 2016-11-29: 5000 [IU] via SUBCUTANEOUS
  Filled 2016-11-29: qty 1

## 2016-11-29 MED ORDER — DEXTROSE 5 % IV SOLN
3.0000 g | INTRAVENOUS | Status: DC
  Filled 2016-11-29: qty 3000

## 2016-11-29 MED ORDER — CITALOPRAM HYDROBROMIDE 20 MG PO TABS
40.0000 mg | ORAL_TABLET | Freq: Every day | ORAL | Status: DC
  Administered 2016-12-01 – 2016-12-03 (×3): 40 mg via ORAL
  Filled 2016-11-29 (×6): qty 2

## 2016-11-29 MED ORDER — BENZTROPINE MESYLATE 1 MG PO TABS
1.0000 mg | ORAL_TABLET | Freq: Every day | ORAL | Status: DC
  Administered 2016-11-29 – 2016-12-02 (×4): 1 mg via ORAL
  Filled 2016-11-29 (×4): qty 1

## 2016-11-29 MED ORDER — SODIUM CHLORIDE 0.9 % IV SOLN
INTRAVENOUS | Status: DC
  Administered 2016-11-30: via INTRAVENOUS

## 2016-11-29 MED ORDER — SODIUM CHLORIDE 0.9% FLUSH
3.0000 mL | Freq: Two times a day (BID) | INTRAVENOUS | Status: DC
  Administered 2016-12-01 – 2016-12-03 (×4): 3 mL via INTRAVENOUS

## 2016-11-29 MED ORDER — INSULIN DETEMIR 100 UNIT/ML ~~LOC~~ SOLN
24.0000 [IU] | Freq: Every day | SUBCUTANEOUS | Status: DC
  Administered 2016-11-29 – 2016-12-02 (×4): 24 [IU] via SUBCUTANEOUS
  Filled 2016-11-29 (×4): qty 0.24

## 2016-11-29 MED ORDER — POVIDONE-IODINE 10 % EX SWAB: 2.0000 "application " | Freq: Once | CUTANEOUS | Status: DC

## 2016-11-29 MED ORDER — MORPHINE SULFATE (PF) 4 MG/ML IV SOLN
1.0000 mg | INTRAVENOUS | Status: DC | PRN
  Administered 2016-11-29 – 2016-11-30 (×3): 1 mg via INTRAVENOUS
  Filled 2016-11-29 (×3): qty 1

## 2016-11-29 MED ORDER — CHLORHEXIDINE GLUCONATE 4 % EX LIQD: 60.0000 mL | Freq: Once | CUTANEOUS | Status: DC

## 2016-11-29 MED ORDER — SENNOSIDES-DOCUSATE SODIUM 8.6-50 MG PO TABS
1.0000 | ORAL_TABLET | Freq: Every evening | ORAL | Status: DC | PRN
  Administered 2016-11-30: 1 via ORAL
  Filled 2016-11-29: qty 1

## 2016-11-29 MED ORDER — SODIUM CHLORIDE 0.9% FLUSH: 3.0000 mL | INTRAVENOUS | Status: DC | PRN

## 2016-11-29 MED ORDER — INSULIN ASPART 100 UNIT/ML ~~LOC~~ SOLN: 0.0000 [IU] | Freq: Every day | SUBCUTANEOUS | Status: DC

## 2016-11-29 MED ORDER — SODIUM CHLORIDE 0.9 % IV SOLN: 250.0000 mL | INTRAVENOUS | Status: DC | PRN

## 2016-11-29 MED ORDER — RISPERIDONE 0.5 MG PO TABS
2.0000 mg | ORAL_TABLET | Freq: Every day | ORAL | Status: DC
  Administered 2016-11-29 – 2016-12-02 (×4): 2 mg via ORAL
  Filled 2016-11-29 (×4): qty 4

## 2016-11-29 MED ORDER — MORPHINE SULFATE (PF) 4 MG/ML IV SOLN: 0.5000 mg | INTRAVENOUS | Status: DC | PRN

## 2016-11-29 NOTE — ED Provider Notes (Signed)
[facility] DEPT Provider Note   CSN: [id number] Arrival date & time: 11/29/16  0636     History   Chief Complaint Chief Complaint  Patient presents with  . Knee Pain    HPI [name] is a 66 y.o. female.  HPI Patient presents with several months of left knee pain. No known trauma. States that this morning she felt that her left knee gave out and she fell. Denies hitting her head, no loss of consciousness. Left knee pain is worsened. No fever or chills. Past Medical History:  Diagnosis Date  . Bipolar 1 disorder ([place])   . Cancer ([place])   . Diabetes mellitus without complication ([place])   . Hypertension     There are no active problems to display for this patient.   Past Surgical History:  Procedure Laterality Date  . ABDOMINAL HYSTERECTOMY    . CHOLECYSTECTOMY    . TONSILLECTOMY      OB History    No data available       Home Medications    Prior to Admission medications   Medication Sig Start Date End Date Taking? Authorizing Provider  ALPRAZolam Duanne Moron) 0.5 MG tablet Take 0.5 mg by mouth daily.   Yes [provider]  aspirin EC 81 MG tablet Take 81 mg by mouth daily.   Yes [provider]  benztropine (COGENTIN) 1 MG tablet Take 1 mg by mouth at bedtime.   Yes [provider]  citalopram (CELEXA) 40 MG tablet Take 40 mg by mouth daily.   Yes [provider]  hydrOXYzine (ATARAX/VISTARIL) 25 MG tablet Take 25 mg by mouth every 6 (six) hours as needed.   Yes [provider]  Influenza vac split quadrivalent PF (FLUZONE HIGH-DOSE) 0.5 ML injection Inject 0.5 mLs into the muscle once.   Yes [provider]  insulin detemir (LEVEMIR) 100 UNIT/ML injection Inject 24 Units into the skin at bedtime.   Yes [provider]  lisinopril (PRINIVIL,ZESTRIL) 20 MG tablet Take 20 mg by mouth daily.   Yes [provider]  lovastatin (MEVACOR) 20 MG tablet Take 20 mg by mouth at bedtime.   Yes  [provider]  metFORMIN (GLUCOPHAGE) 500 MG tablet Take by mouth 2 (two) times daily with a meal.   Yes [provider]  naproxen (NAPROSYN) 500 MG tablet Take 500 mg by mouth 2 (two) times daily with a meal.   Yes [provider]  risperiDONE (RISPERDAL) 2 MG tablet Take 2 mg by mouth at bedtime.   Yes [provider]    Family History History reviewed. No pertinent family history.  Social History Social History  Substance Use Topics  . Smoking status: Former Research scientist (life sciences)  . Smokeless tobacco: Never Used  . Alcohol use No     Allergies   Sulfa antibiotics   Review of Systems Review of Systems  Constitutional: Negative for chills and fever.  Respiratory: Negative for shortness of breath.   Cardiovascular: Negative for chest pain.  Gastrointestinal: Negative for abdominal pain, nausea and vomiting.  Musculoskeletal: Positive for arthralgias and myalgias. Negative for back pain and neck pain.  Skin: Negative for rash and wound.  Neurological: Negative for dizziness, syncope, weakness, light-headedness, numbness and headaches.  All other systems reviewed and are negative.    Physical Exam Updated Vital Signs BP (!) 179/89   Pulse 86   Temp 98.3 F (36.8 C) (Oral)   Resp 20   Ht 5\' 3"  (1.6 m)  Wt 127 kg (280 lb)   SpO2 91%   BMI 49.60 kg/m   Physical Exam  Constitutional: She is oriented to person, place, and time. She appears well-developed and well-nourished. No distress.  HENT:  Head: Normocephalic and atraumatic.  Mouth/Throat: Oropharynx is clear and moist. No oropharyngeal exudate.   No intraoral trauma. No scalp lesions.  Eyes: Pupils are equal, round, and reactive to light. EOM are normal.  Neck: Normal range of motion. Neck supple.  No posterior midline cervical tenderness to palpation.  Cardiovascular: Normal rate and regular rhythm.   Pulmonary/Chest: Effort normal and breath sounds normal.  Abdominal: Soft. Bowel  sounds are normal. There is no tenderness. There is no rebound and no guarding.  Musculoskeletal: Normal range of motion. She exhibits edema and tenderness.  Diffusely tender left knee. Mild effusion. No ligamentous instability. Range of motion limited with pain. Patient does have some mild left-sided calf tenderness. 2+ dorsalis pedis and posterior tibial pulses.  Neurological: She is alert and oriented to person, place, and time.  Diminished sensation to light touch bilateral lower extremities. 5/5 motor strength.  Skin: Skin is warm and dry. Capillary refill takes less than 2 seconds. No rash noted. No erythema.  Psychiatric: She has a normal mood and affect. Her behavior is normal.  Nursing note and vitals reviewed.    ED Treatments / Results  Labs (all labs ordered are listed, but only abnormal results are displayed) Labs Reviewed  CBC WITH DIFFERENTIAL/PLATELET - Abnormal; Notable for the following:       Result Value   WBC 13.7 (*)    Neutro Abs 10.8 (*)    All other components within normal limits  COMPREHENSIVE METABOLIC PANEL - Abnormal; Notable for the following:    Glucose, Bld 135 (*)    All other components within normal limits  URINALYSIS, ROUTINE W REFLEX MICROSCOPIC    EKG  EKG Interpretation  Date/Time:  Monday November 29 2016 09:43:22 EDT Ventricular Rate:  86 PR Interval:    QRS Duration: 84 QT Interval:  538 QTC Calculation: 644 R Axis:   2 Text Interpretation:  Sinus rhythm Borderline short PR interval Low voltage, precordial leads Borderline T abnormalities, diffuse leads Prolonged QT interval Confirmed by Julianne Rice [phone]) on 11/29/2016 11:24:53 AM       Radiology Ct Knee Left Wo Contrast  Result Date: 11/29/2016 CLINICAL DATA:  Left knee pain with a fracture due to a fall today. Initial encounter. EXAM: CT OF THE LEFT KNEE WITHOUT CONTRAST TECHNIQUE: Multidetector CT imaging of the left knee was performed according to the standard protocol.  Multiplanar CT image reconstructions were also generated. COMPARISON:  Plain films left knee this same day. FINDINGS: Bones/Joint/Cartilage As seen on the comparison plain films, the patient has a fracture of the distal femur. The fracture originates in the posterior cortex of the femoral diaphysis approximately 8 cm above the weight-bearing lateral femoral condyle. The fracture extends in an inferior and medial orientation through the central aspect of the intercondylar notch. The fracture is nondisplaced. No other fracture is identified. Joint space narrowing about the knee is worst medially. Tricompartmental osteophytosis is identified. Ligaments Suboptimally assessed by CT.  Appear intact. Muscles and Tendons Intact. Soft tissues Small Baker's cyst is noted. IMPRESSION: Nondisplaced fracture of the distal femur originates in the lateral, posterior cortex of the diaphysis and extends through the central aspect of the intercondylar notch of the femur. No other acute abnormality. Tricompartmental osteoarthritis. Electronically Signed   By: Marcello Moores  Dalessio M.D.   On: 11/29/2016 09:43   Dg Chest Port 1 View  Result Date: 11/29/2016 CLINICAL DATA:  Hypoxia EXAM: PORTABLE CHEST 1 VIEW COMPARISON:  03/27/2010 FINDINGS: Mild elevation the right hemidiaphragm. Heart is upper limits normal in size. No confluent airspace opacities or effusions. Limited study by body habitus. IMPRESSION: Mildly elevated right hemidiaphragm.  No active disease. Electronically Signed   By: Rolm Baptise M.D.   On: 11/29/2016 10:32   Dg Knee Complete 4 Views Left  Result Date: 11/29/2016 CLINICAL DATA:  66 year old female post fall with left knee pain. Initial encounter. EXAM: LEFT KNEE - COMPLETE 4+ VIEW COMPARISON:  None. FINDINGS: Slightly oblique vertical fracture extends from the distal left femoral medial diametaphysis into the central articular aspect of the distal femur (adjacent to tibial spine) spanning over 8.6 cm. Small  joint effusion. Moderate medial tibiofemoral joint space and patellofemoral joint degenerative changes. IMPRESSION: Slightly oblique vertical fracture extends from the distal left femoral medial diametaphysis into the central articular aspect of the distal femur (adjacent to tibial spine) spanning over 8.6 cm. Small joint effusion. Electronically Signed   By: Genia Del M.D.   On: 11/29/2016 07:56    Procedures Procedures (including critical care time)  Medications Ordered in ED Medications - No data to display   Initial Impression / Assessment and Plan / ED Course  I have reviewed the triage vital signs and the nursing notes.  Pertinent labs & imaging results that were available during my care of the patient were reviewed by me and considered in my medical decision making (see chart for details).     Discussed with Dr. Doran Durand. Advised to have patient admitted to hospitalists and transferred to [facility] cone. Advised to keep the patient NPO for likely fracture repair today. Dr. Jerilee Hoh will see patient in the emergency department and arranged transport.  Final Clinical Impressions(s) / ED Diagnoses   Final diagnoses:  Closed fracture of distal end of left femur, unspecified fracture morphology, initial encounter [facility])    New Prescriptions New Prescriptions   No medications on file     Julianne Rice, MD 11/29/16 1125

## 2016-11-29 NOTE — ED Notes (Signed)
Report given to Sam, RN for [phone]. [facility] in room with pt at this time.

## 2016-11-29 NOTE — ED Notes (Signed)
New order for Morphine 0.5mg  q 4hrs prn for pain received verbally from Dr. Jerilee Hoh at this time.

## 2016-11-29 NOTE — ED Notes (Signed)
Called [facility] with rm assignment at [facility] and for transport.

## 2016-11-29 NOTE — H&P (Signed)
History and Physical    [name] WUX:[id number] DOB: 1950/03/03 DOA: 11/29/2016  Referring MD/NP/PA: Julianne Rice, EDP PCP: Lucia Gaskins, MD  Patient coming from: Home  Chief Complaint: Fall with left knee pain  HPI: [name] is a 66 y.o. female with a history of bipolar disorder, diabetes and hypertension as well as morbid obesity. She states that for about 4 days she has been having left knee pain and has been "hobbling" on that leg. Today she hopped into the bathroom where she lost her balance and fell backwards and hit the back of her leg against the toilet. She was unable to stand unassisted and was brought by EMS to the hospital. She denies loss of consciousness, dizziness, tunnel vision, lightheadedness prior to fall. Labs are essentially within normal limits with the exception of a white count that is slightly elevated at 13.7, left knee x-ray shows a slightly oblique vertical fracture that extends from the distal left femur medial diametaphysis into the central articular aspect of the distal femur spanning over 8.6 cm. CT of the left knee shows a nondisplaced fracture of the distal femur which originates in the lateral, posterior cortex of the diaphysis and extends through the central aspect of the intercondylar notch of the femur, no other acute abnormalities, tricompartmental osteoarthritis is also noted. EDP has discussed with orthopedics on call, Dr. Doran Durand, who recommends transfer to [facility] for operative repair, likely today.  Past Medical/Surgical History: Past Medical History:  Diagnosis Date  . Bipolar 1 disorder ([place])   . Cancer ([place])   . Diabetes mellitus without complication ([place])   . Hypertension     Past Surgical History:  Procedure Laterality Date  . ABDOMINAL HYSTERECTOMY    . CHOLECYSTECTOMY    . TONSILLECTOMY      Social History:  reports that she has quit smoking. She has never used smokeless tobacco. She reports that she  does not drink alcohol or use drugs.  Allergies: Allergies  Allergen Reactions  . Sulfa Antibiotics     Family History:  She is not aware of any family history of significance  Prior to Admission medications   Medication Sig Start Date End Date Taking? Authorizing Provider  ALPRAZolam Duanne Moron) 0.5 MG tablet Take 0.5 mg by mouth daily.   Yes [provider]  aspirin EC 81 MG tablet Take 81 mg by mouth daily.   Yes [provider]  benztropine (COGENTIN) 1 MG tablet Take 1 mg by mouth at bedtime.   Yes [provider]  citalopram (CELEXA) 40 MG tablet Take 40 mg by mouth daily.   Yes [provider]  hydrOXYzine (ATARAX/VISTARIL) 25 MG tablet Take 25 mg by mouth every 6 (six) hours as needed.   Yes [provider]  Influenza vac split quadrivalent PF (FLUZONE HIGH-DOSE) 0.5 ML injection Inject 0.5 mLs into the muscle once.   Yes [provider]  insulin detemir (LEVEMIR) 100 UNIT/ML injection Inject 24 Units into the skin at bedtime.   Yes [provider]  lisinopril (PRINIVIL,ZESTRIL) 20 MG tablet Take 20 mg by mouth daily.   Yes [provider]  lovastatin (MEVACOR) 20 MG tablet Take 20 mg by mouth at bedtime.   Yes [provider]  metFORMIN (GLUCOPHAGE) 500 MG tablet Take by mouth 2 (two) times daily with a meal.   Yes [provider]  naproxen (NAPROSYN) 500 MG tablet Take 500 mg by mouth 2 (two) times daily with a meal.  Yes [provider]  risperiDONE (RISPERDAL) 2 MG tablet Take 2 mg by mouth at bedtime.   Yes [provider]    Review of Systems: Constitutional: Denies fever, chills, diaphoresis, appetite change and fatigue.  HEENT: Denies photophobia, eye pain, redness, hearing loss, ear pain, congestion, sore throat, rhinorrhea, sneezing, mouth sores, trouble swallowing, neck pain, neck stiffness and tinnitus.   Respiratory: Denies SOB, DOE, cough, chest tightness,  and  wheezing.   Cardiovascular: Denies chest pain, palpitations and leg swelling.  Gastrointestinal: Denies nausea, vomiting, abdominal pain, diarrhea, constipation, blood in stool and abdominal distention.  Genitourinary: Denies dysuria, urgency, frequency, hematuria, flank pain and difficulty urinating.  Endocrine: Denies: hot or cold intolerance, sweats, changes in hair or nails, polyuria, polydipsia. Musculoskeletal: Denies myalgias, back pain, joint swelling, arthralgias and gait problem.  Skin: Denies pallor, rash and wound.  Neurological: Denies dizziness, seizures, syncope, weakness, light-headedness, numbness and headaches.  Hematological: Denies adenopathy. Easy bruising, personal or family bleeding history  Psychiatric/Behavioral: Denies suicidal ideation, mood changes, confusion, nervousness, sleep disturbance and agitation    Physical Exam: Vitals:   11/29/16 1200 11/29/16 1230 11/29/16 1330 11/29/16 1400  BP: (!) 156/87 (!) 159/73 (!) 148/78 (!) 161/84  Pulse: 87 86 88 89  Resp: 17 20 (!) 23 16  Temp:      TempSrc:      SpO2: 90% 91% 92% 91%  Weight:      Height:         Constitutional: NAD, calm, comfortable, Morbidly obese Eyes: PERRL, lids and conjunctivae normal ENMT: Mucous membranes are moist. Posterior pharynx clear of any exudate or lesions.Normal dentition.  Neck: normal, supple, no masses, no thyromegaly Respiratory: clear to auscultation bilaterally, no wheezing, no crackles. Normal respiratory effort. No accessory muscle use.  Cardiovascular: Regular rate and rhythm, no murmurs / rubs / gallops. No extremity edema. 2+ pedal pulses. No carotid bruits.  Abdomen: no tenderness, no masses palpated. No hepatosplenomegaly. Bowel sounds positive.  Musculoskeletal: no clubbing / cyanosis. No joint deformity upper and lower extremities. Good ROM, no contractures. Normal muscle tone.  Skin: no rashes, lesions, ulcers. No induration Neurologic: CN 2-12 grossly intact.  Sensation intact, DTR normal. Strength 5/5 in all 4.  Psychiatric: Normal judgment and insight. Alert and oriented x 3. Normal mood.    Labs on Admission: I have personally reviewed the following labs and imaging studies  CBC:  Recent Labs Lab 11/29/16 0945  WBC 13.7*  NEUTROABS 10.8*  HGB 13.9  HCT 42.0  MCV 86.6  PLT 672   Basic Metabolic Panel:  Recent Labs Lab 11/29/16 0945  NA 138  K 3.7  CL 103  CO2 25  GLUCOSE 135*  BUN 12  CREATININE 0.93  CALCIUM 9.2   GFR: Estimated Creatinine Clearance: 79.3 mL/min (by C-G formula based on SCr of 0.93 mg/dL). Liver Function Tests:  Recent Labs Lab 11/29/16 0945  AST 24  ALT 23  ALKPHOS 85  BILITOT 0.6  PROT 7.3  ALBUMIN 3.9   No results for input(s): LIPASE, AMYLASE in the last 168 hours. No results for input(s): AMMONIA in the last 168 hours. Coagulation Profile: No results for input(s): INR, PROTIME in the last 168 hours. Cardiac Enzymes: No results for input(s): CKTOTAL, CKMB, CKMBINDEX, TROPONINI in the last 168 hours. BNP (last 3 results) No results for input(s): PROBNP in the last 8760 hours. HbA1C: No results for input(s): HGBA1C in the last 72 hours. CBG: No results for input(s): GLUCAP in the last  168 hours. Lipid Profile: No results for input(s): CHOL, HDL, LDLCALC, TRIG, CHOLHDL, LDLDIRECT in the last 72 hours. Thyroid Function Tests: No results for input(s): TSH, T4TOTAL, FREET4, T3FREE, THYROIDAB in the last 72 hours. Anemia Panel: No results for input(s): VITAMINB12, FOLATE, FERRITIN, TIBC, IRON, RETICCTPCT in the last 72 hours. Urine analysis:    Component Value Date/Time   COLORURINE YELLOW 03/29/2010 0931   APPEARANCEUR HAZY (A) 03/29/2010 0931   LABSPEC 1.010 03/29/2010 0931   PHURINE 6.0 03/29/2010 0931   GLUCOSEU NEGATIVE 01/12/2008 1642   HGBUR MODERATE (A) 03/29/2010 0931   BILIRUBINUR NEGATIVE 03/29/2010 0931   KETONESUR NEGATIVE 03/29/2010 0931   PROTEINUR NEGATIVE  03/29/2010 0931   UROBILINOGEN 0.2 03/29/2010 0931   NITRITE NEGATIVE 03/29/2010 0931   LEUKOCYTESUR NEGATIVE 03/29/2010 0931   Sepsis Labs: @LABRCNTIP (procalcitonin:4,lacticidven:4) )No results found for this or any previous visit (from the past 240 hour(s)).   Radiological Exams on Admission: Ct Knee Left Wo Contrast  Result Date: 11/29/2016 CLINICAL DATA:  Left knee pain with a fracture due to a fall today. Initial encounter. EXAM: CT OF THE LEFT KNEE WITHOUT CONTRAST TECHNIQUE: Multidetector CT imaging of the left knee was performed according to the standard protocol. Multiplanar CT image reconstructions were also generated. COMPARISON:  Plain films left knee this same day. FINDINGS: Bones/Joint/Cartilage As seen on the comparison plain films, the patient has a fracture of the distal femur. The fracture originates in the posterior cortex of the femoral diaphysis approximately 8 cm above the weight-bearing lateral femoral condyle. The fracture extends in an inferior and medial orientation through the central aspect of the intercondylar notch. The fracture is nondisplaced. No other fracture is identified. Joint space narrowing about the knee is worst medially. Tricompartmental osteophytosis is identified. Ligaments Suboptimally assessed by CT.  Appear intact. Muscles and Tendons Intact. Soft tissues Small Baker's cyst is noted. IMPRESSION: Nondisplaced fracture of the distal femur originates in the lateral, posterior cortex of the diaphysis and extends through the central aspect of the intercondylar notch of the femur. No other acute abnormality. Tricompartmental osteoarthritis. Electronically Signed   By: Inge Rise M.D.   On: 11/29/2016 09:43   Dg Chest Port 1 View  Result Date: 11/29/2016 CLINICAL DATA:  Hypoxia EXAM: PORTABLE CHEST 1 VIEW COMPARISON:  03/27/2010 FINDINGS: Mild elevation the right hemidiaphragm. Heart is upper limits normal in size. No confluent airspace opacities or  effusions. Limited study by body habitus. IMPRESSION: Mildly elevated right hemidiaphragm.  No active disease. Electronically Signed   By: Rolm Baptise M.D.   On: 11/29/2016 10:32   Dg Knee Complete 4 Views Left  Result Date: 11/29/2016 CLINICAL DATA:  66 year old female post fall with left knee pain. Initial encounter. EXAM: LEFT KNEE - COMPLETE 4+ VIEW COMPARISON:  None. FINDINGS: Slightly oblique vertical fracture extends from the distal left femoral medial diametaphysis into the central articular aspect of the distal femur (adjacent to tibial spine) spanning over 8.6 cm. Small joint effusion. Moderate medial tibiofemoral joint space and patellofemoral joint degenerative changes. IMPRESSION: Slightly oblique vertical fracture extends from the distal left femoral medial diametaphysis into the central articular aspect of the distal femur (adjacent to tibial spine) spanning over 8.6 cm. Small joint effusion. Electronically Signed   By: Genia Del M.D.   On: 11/29/2016 07:56    EKG: Independently reviewed. Sinus rhythm with diffusely flattened T waves that is unchanged from prior  Assessment/Plan Principal Problem:   Other fracture of left femur, initial encounter for closed fracture (  [place]) Active Problems:   Morbid obesity ([place])    Left femur fracture -As per discussion between EDP and orthopedics, Dr. Doran Durand, patient will need operative repair. -Per order request, will leave patient nothing by mouth as she may possibly have surgery this afternoon. -When necessary pain management for now only with Tylenol. She is not requiring or requesting high doses of narcotics. -Okay to proceed to surgery without further clearance.  Morbid obesity -Noted, counseled on diet and exercise.  Bipolar disorder -Mood stable, continue meds.  Type 2 diabetes -Hold metformin, check A1c, place on sensitive sliding scale, continue Lantus.    DVT prophylaxis: Subcutaneous heparin  Code Status: Full code    Family Communication: Patient only  Disposition Plan: Transferred to [facility] for consultation by orthopedics  Consults called: Orthopedics, Dr. Doran Durand  Admission status: Inpatient    Time Spent: 85 minutes   Lelon Frohlich MD [facility] Pager [phone]  If 7PM-7AM, please contact night-coverage [email or web address] Password [email or web address]  11/29/2016, 2:09 PM

## 2016-11-29 NOTE — Consult Note (Signed)
Reason for Consult:  Left knee pain Referring Physician: Dr. Merrell  [name] is an 66 y.o. female.  HPI: 66 y/o female with PMH of diabetes and knee arthritis c/o pain in the left knee that has been bothering her for several days.  She was was walking out of the bathroom at home ths morning when her knee gave way and she fell.  She landed on her left side and had an immediate increase in pain.  She c/o aching pain in the left knee that is worse with motion and better with rest.  She denies any surgery on that knee in the past.  She takes no blood thinners.  No recent changes in her health.  Past Medical History:  Diagnosis Date  . Bipolar 1 disorder ([facility])   . Cancer ([facility])   . Diabetes mellitus without complication ([facility])   . Hypertension     Past Surgical History:  Procedure Laterality Date  . ABDOMINAL HYSTERECTOMY    . CHOLECYSTECTOMY    . TONSILLECTOMY      History reviewed. No pertinent family history.  Social History:  reports that she has quit smoking. She has never used smokeless tobacco. She reports that she does not drink alcohol or use drugs.  Allergies:  Allergies  Allergen Reactions  . Sulfa Antibiotics     Medications: I have reviewed the patient's current medications.  Results for orders placed or performed during the hospital encounter of 11/29/16 (from the past 48 hour(s))  CBC with Differential/Platelet     Status: Abnormal   Collection Time: 11/29/16  9:45 AM  Result Value Ref Range   WBC 13.7 (H) 4.0 - 10.5 K/uL   RBC 4.85 3.87 - 5.11 MIL/uL   Hemoglobin 13.9 12.0 - 15.0 g/dL   HCT 42.0 36.0 - 46.0 %   MCV 86.6 78.0 - 100.0 fL   MCH 28.7 26.0 - 34.0 pg   MCHC 33.1 30.0 - 36.0 g/dL   RDW 14.1 11.5 - 15.5 %   Platelets 283 150 - 400 K/uL   Neutrophils Relative % 79 %   Neutro Abs 10.8 (H) 1.7 - 7.7 K/uL   Lymphocytes Relative 15 %   Lymphs Abs 2.0 0.7 - 4.0 K/uL   Monocytes Relative 5 %   Monocytes Absolute 0.7 0.1 - 1.0 K/uL   Eosinophils  Relative 1 %   Eosinophils Absolute 0.1 0.0 - 0.7 K/uL   Basophils Relative 0 %   Basophils Absolute 0.1 0.0 - 0.1 K/uL  Comprehensive metabolic panel     Status: Abnormal   Collection Time: 11/29/16  9:45 AM  Result Value Ref Range   Sodium 138 135 - 145 mmol/L   Potassium 3.7 3.5 - 5.1 mmol/L   Chloride 103 101 - 111 mmol/L   CO2 25 22 - 32 mmol/L   Glucose, Bld 135 (H) 65 - 99 mg/dL   BUN 12 6 - 20 mg/dL   Creatinine, Ser 0.93 0.44 - 1.00 mg/dL   Calcium 9.2 8.9 - 10.3 mg/dL   Total Protein 7.3 6.5 - 8.1 g/dL   Albumin 3.9 3.5 - 5.0 g/dL   AST 24 15 - 41 U/L   ALT 23 14 - 54 U/L   Alkaline Phosphatase 85 38 - 126 U/L   Total Bilirubin 0.6 0.3 - 1.2 mg/dL   GFR calc non Af Amer >60 >60 mL/min   GFR calc Af Amer >60 >60 mL/min    Comment: (NOTE) The eGFR has been   calculated using the CKD EPI equation. This calculation has not been validated in all clinical situations. eGFR's persistently <60 mL/min signify possible Chronic Kidney Disease.    Anion gap 10 5 - 15    Ct Knee Left Wo Contrast  Result Date: 11/29/2016 CLINICAL DATA:  Left knee pain with a fracture due to a fall today. Initial encounter. EXAM: CT OF THE LEFT KNEE WITHOUT CONTRAST TECHNIQUE: Multidetector CT imaging of the left knee was performed according to the standard protocol. Multiplanar CT image reconstructions were also generated. COMPARISON:  Plain films left knee this same day. FINDINGS: Bones/Joint/Cartilage As seen on the comparison plain films, the patient has a fracture of the distal femur. The fracture originates in the posterior cortex of the femoral diaphysis approximately 8 cm above the weight-bearing lateral femoral condyle. The fracture extends in an inferior and medial orientation through the central aspect of the intercondylar notch. The fracture is nondisplaced. No other fracture is identified. Joint space narrowing about the knee is worst medially. Tricompartmental osteophytosis is identified.  Ligaments Suboptimally assessed by CT.  Appear intact. Muscles and Tendons Intact. Soft tissues Small Baker's cyst is noted. IMPRESSION: Nondisplaced fracture of the distal femur originates in the lateral, posterior cortex of the diaphysis and extends through the central aspect of the intercondylar notch of the femur. No other acute abnormality. Tricompartmental osteoarthritis. Electronically Signed   By: Thomas  Dalessio M.D.   On: 11/29/2016 09:43   Dg Chest Port 1 View  Result Date: 11/29/2016 CLINICAL DATA:  Hypoxia EXAM: PORTABLE CHEST 1 VIEW COMPARISON:  03/27/2010 FINDINGS: Mild elevation the right hemidiaphragm. Heart is upper limits normal in size. No confluent airspace opacities or effusions. Limited study by body habitus. IMPRESSION: Mildly elevated right hemidiaphragm.  No active disease. Electronically Signed   By: Kevin  Dover M.D.   On: 11/29/2016 10:32   Dg Knee Complete 4 Views Left  Result Date: 11/29/2016 CLINICAL DATA:  66-year-old female post fall with left knee pain. Initial encounter. EXAM: LEFT KNEE - COMPLETE 4+ VIEW COMPARISON:  None. FINDINGS: Slightly oblique vertical fracture extends from the distal left femoral medial diametaphysis into the central articular aspect of the distal femur (adjacent to tibial spine) spanning over 8.6 cm. Small joint effusion. Moderate medial tibiofemoral joint space and patellofemoral joint degenerative changes. IMPRESSION: Slightly oblique vertical fracture extends from the distal left femoral medial diametaphysis into the central articular aspect of the distal femur (adjacent to tibial spine) spanning over 8.6 cm. Small joint effusion. Electronically Signed   By: Steven  Olson M.D.   On: 11/29/2016 07:56    ROS:  No recent f/c/n/v/wt loss PE:  Blood pressure (!) 160/85, pulse 88, temperature 98 F (36.7 C), resp. rate 16, height 5' 3" (1.6 m), weight 127 kg (280 lb), SpO2 90 %. wn wd overweight female in nad.  A and O x 4.  Mood and affect  normal.  EOMI.  resp unlabored.  L knee TTP with an effusion.  No gross deformity.  Skin heatlhy and intact.  No lymphadenopathy.  5/5 strength in PF and DF of the ankle and toes.  Sens to LT intact at the dorsal and plantar forefoot.  Assessment/Plan: L femur intercondylar fracture - closed and non displaced - I explained the nature of the injury to the patient in detail.  This fracture is at extremely high risk of displacement.  I believe it's medically necessary to consider operative treatment.  Dr. Swinteck will plan to operate tomorrow.  She can   eat tonight and be NPO after midnight for surgery tmorrow.  Hold blood thinners.    The patient understands the plan and agrees.  The risks and benefits of the alternative treatment options have been discussed in detail.  The patient wishes to proceed with surgery and specifically understands risks of bleeding, infection, nerve damage, blood clots, need for additional surgery, amputation and death.   Nadja Lina 11/29/2016, 7:12 PM      

## 2016-11-29 NOTE — ED Triage Notes (Signed)
Pt c/o left knee pain.

## 2016-11-29 NOTE — ED Notes (Signed)
Report given to [facility] with [facility] at this time. ETA 20 minutes.

## 2016-11-29 NOTE — Progress Notes (Signed)
Patient arrived to unit. A/O X 4 and in no distress. Admission MD paged about patient arriving on unit.

## 2016-11-30 ENCOUNTER — Inpatient Hospital Stay (HOSPITAL_COMMUNITY): Payer: Medicare HMO | Admitting: Anesthesiology

## 2016-11-30 ENCOUNTER — Inpatient Hospital Stay (HOSPITAL_COMMUNITY): Payer: Medicare HMO

## 2016-11-30 ENCOUNTER — Encounter (HOSPITAL_COMMUNITY): Payer: Self-pay | Admitting: Certified Registered Nurse Anesthetist

## 2016-11-30 ENCOUNTER — Encounter (HOSPITAL_COMMUNITY)
Admission: EM | Disposition: A | Discharge status: Skilled Nursing Facility | Payer: Self-pay | Source: Home / Self Care | Attending: Internal Medicine

## 2016-11-30 DIAGNOSIS — I10 Essential (primary) hypertension: Secondary | ICD-10-CM | POA: Diagnosis present

## 2016-11-30 DIAGNOSIS — E119 Type 2 diabetes mellitus without complications: Secondary | ICD-10-CM

## 2016-11-30 DIAGNOSIS — S72092K Other fracture of head and neck of left femur, subsequent encounter for closed fracture with nonunion: Secondary | ICD-10-CM

## 2016-11-30 DIAGNOSIS — F319 Bipolar disorder, unspecified: Secondary | ICD-10-CM

## 2016-11-30 DIAGNOSIS — E785 Hyperlipidemia, unspecified: Secondary | ICD-10-CM

## 2016-11-30 DIAGNOSIS — S72402A Unspecified fracture of lower end of left femur, initial encounter for closed fracture: Secondary | ICD-10-CM | POA: Diagnosis present

## 2016-11-30 DIAGNOSIS — Z794 Long term (current) use of insulin: Secondary | ICD-10-CM

## 2016-11-30 HISTORY — PX: ORIF FEMUR FRACTURE: SHX2119

## 2016-11-30 LAB — GLUCOSE, CAPILLARY
GLUCOSE-CAPILLARY: 185 mg/dL — AB (ref 65–99)
Glucose-Capillary: 100 mg/dL — ABNORMAL HIGH (ref 65–99)
Glucose-Capillary: 100 mg/dL — ABNORMAL HIGH (ref 65–99)
Glucose-Capillary: 162 mg/dL — ABNORMAL HIGH (ref 65–99)

## 2016-11-30 LAB — BASIC METABOLIC PANEL
ANION GAP: 9 (ref 5–15)
BUN: 11 mg/dL (ref 6–20)
CO2: 27 mmol/L (ref 22–32)
Calcium: 8.8 mg/dL — ABNORMAL LOW (ref 8.9–10.3)
Chloride: 103 mmol/L (ref 101–111)
Creatinine, Ser: 0.91 mg/dL (ref 0.44–1.00)
GLUCOSE: 114 mg/dL — AB (ref 65–99)
POTASSIUM: 3.7 mmol/L (ref 3.5–5.1)
Sodium: 139 mmol/L (ref 135–145)

## 2016-11-30 LAB — CBC
HEMATOCRIT: 37.7 % (ref 36.0–46.0)
HEMATOCRIT: 40.2 % (ref 36.0–46.0)
HEMOGLOBIN: 12.8 g/dL (ref 12.0–15.0)
HEMOGLOBIN: 13.2 g/dL (ref 12.0–15.0)
MCH: 29.1 pg (ref 26.0–34.0)
MCH: 28.5 pg (ref 26.0–34.0)
MCHC: 34 g/dL (ref 30.0–36.0)
MCHC: 32.8 g/dL (ref 30.0–36.0)
MCV: 85.7 fL (ref 78.0–100.0)
MCV: 86.8 fL (ref 78.0–100.0)
PLATELETS: 247 10*3/uL (ref 150–400)
PLATELETS: 240 10*3/uL (ref 150–400)
RBC: 4.4 MIL/uL (ref 3.87–5.11)
RBC: 4.63 MIL/uL (ref 3.87–5.11)
RDW: 14.2 % (ref 11.5–15.5)
RDW: 14.7 % (ref 11.5–15.5)
WBC: 11.2 10*3/uL — AB (ref 4.0–10.5)
WBC: 15 10*3/uL — AB (ref 4.0–10.5)

## 2016-11-30 LAB — SURGICAL PCR SCREEN
MRSA, PCR: NEGATIVE
Staphylococcus aureus: NEGATIVE

## 2016-11-30 LAB — CREATININE, SERUM
Creatinine, Ser: 0.94 mg/dL (ref 0.44–1.00)
GFR calc non Af Amer: >60 mL/min (ref >60)

## 2016-11-30 LAB — HIV ANTIBODY (ROUTINE TESTING W REFLEX): HIV SCREEN 4TH GENERATION: NONREACTIVE

## 2016-11-30 SURGERY — OPEN REDUCTION INTERNAL FIXATION (ORIF) DISTAL FEMUR FRACTURE
Anesthesia: General | Site: Leg Upper | Laterality: Left

## 2016-11-30 MED ORDER — HYDROMORPHONE HCL 1 MG/ML IJ SOLN
INTRAMUSCULAR | Status: AC
  Filled 2016-11-30: qty 1

## 2016-11-30 MED ORDER — MIDAZOLAM HCL 2 MG/2ML IJ SOLN
INTRAMUSCULAR | Status: AC
  Filled 2016-11-30: qty 2

## 2016-11-30 MED ORDER — HYDROMORPHONE HCL 1 MG/ML IJ SOLN
0.2500 mg | INTRAMUSCULAR | Status: DC | PRN
  Administered 2016-11-30 (×2): 0.5 mg via INTRAVENOUS

## 2016-11-30 MED ORDER — LACTATED RINGERS IV SOLN
INTRAVENOUS | Status: DC
  Administered 2016-11-30 (×3): via INTRAVENOUS

## 2016-11-30 MED ORDER — SCOPOLAMINE 1 MG/3DAYS TD PT72
MEDICATED_PATCH | TRANSDERMAL | Status: DC | PRN
  Administered 2016-11-30: 1 via TRANSDERMAL

## 2016-11-30 MED ORDER — ENOXAPARIN SODIUM 40 MG/0.4ML ~~LOC~~ SOLN
40.0000 mg | SUBCUTANEOUS | Status: DC
  Administered 2016-12-01 – 2016-12-03 (×3): 40 mg via SUBCUTANEOUS
  Filled 2016-11-30 (×3): qty 0.4

## 2016-11-30 MED ORDER — PHENOL 1.4 % MT LIQD: 1.0000 | OROMUCOSAL | Status: DC | PRN

## 2016-11-30 MED ORDER — ACETAMINOPHEN 325 MG PO TABS: 650.0000 mg | ORAL_TABLET | Freq: Four times a day (QID) | ORAL | Status: DC | PRN

## 2016-11-30 MED ORDER — LABETALOL HCL 5 MG/ML IV SOLN
INTRAVENOUS | Status: AC
  Filled 2016-11-30: qty 4

## 2016-11-30 MED ORDER — PROPOFOL 10 MG/ML IV BOLUS
INTRAVENOUS | Status: AC
  Filled 2016-11-30: qty 20

## 2016-11-30 MED ORDER — PROPOFOL 10 MG/ML IV BOLUS
INTRAVENOUS | Status: DC | PRN
  Administered 2016-11-30: 140 mg via INTRAVENOUS

## 2016-11-30 MED ORDER — DEXTROSE 5 % IV SOLN
3.0000 g | Freq: Four times a day (QID) | INTRAVENOUS | Status: AC
  Administered 2016-11-30 – 2016-12-01 (×2): 3 g via INTRAVENOUS
  Filled 2016-11-30: qty 3
  Filled 2016-11-30 (×2): qty 3000

## 2016-11-30 MED ORDER — ONDANSETRON HCL 4 MG/2ML IJ SOLN
4.0000 mg | Freq: Four times a day (QID) | INTRAMUSCULAR | Status: DC | PRN
  Administered 2016-11-30 – 2016-12-02 (×2): 4 mg via INTRAVENOUS
  Filled 2016-11-30 (×2): qty 2

## 2016-11-30 MED ORDER — PHENYLEPHRINE HCL 10 MG/ML IJ SOLN
INTRAMUSCULAR | Status: DC | PRN
  Administered 2016-11-30 (×3): 80 ug via INTRAVENOUS

## 2016-11-30 MED ORDER — DEXAMETHASONE SODIUM PHOSPHATE 10 MG/ML IJ SOLN
INTRAMUSCULAR | Status: DC | PRN
  Administered 2016-11-30: 10 mg via INTRAVENOUS

## 2016-11-30 MED ORDER — SCOPOLAMINE 1 MG/3DAYS TD PT72
MEDICATED_PATCH | TRANSDERMAL | Status: AC
  Filled 2016-11-30: qty 1

## 2016-11-30 MED ORDER — METOCLOPRAMIDE HCL 5 MG/ML IJ SOLN: 5.0000 mg | Freq: Three times a day (TID) | INTRAMUSCULAR | Status: DC | PRN

## 2016-11-30 MED ORDER — FENTANYL CITRATE (PF) 250 MCG/5ML IJ SOLN
INTRAMUSCULAR | Status: AC
  Filled 2016-11-30: qty 5

## 2016-11-30 MED ORDER — MENTHOL 3 MG MT LOZG: 1.0000 | LOZENGE | OROMUCOSAL | Status: DC | PRN

## 2016-11-30 MED ORDER — ONDANSETRON HCL 4 MG/2ML IJ SOLN
INTRAMUSCULAR | Status: DC | PRN
  Administered 2016-11-30: 4 mg via INTRAVENOUS

## 2016-11-30 MED ORDER — MIDAZOLAM HCL 5 MG/5ML IJ SOLN
INTRAMUSCULAR | Status: DC | PRN
  Administered 2016-11-30: 2 mg via INTRAVENOUS

## 2016-11-30 MED ORDER — ACETAMINOPHEN 650 MG RE SUPP: 650.0000 mg | Freq: Four times a day (QID) | RECTAL | Status: DC | PRN

## 2016-11-30 MED ORDER — HYDROCODONE-ACETAMINOPHEN 5-325 MG PO TABS
1.0000 | ORAL_TABLET | Freq: Four times a day (QID) | ORAL | Status: DC | PRN
  Administered 2016-11-30 – 2016-12-01 (×4): 2 via ORAL
  Administered 2016-12-02: 1 via ORAL
  Administered 2016-12-02 – 2016-12-03 (×3): 2 via ORAL
  Filled 2016-11-30 (×7): qty 2
  Filled 2016-11-30: qty 1

## 2016-11-30 MED ORDER — PROMETHAZINE HCL 25 MG/ML IJ SOLN: 6.2500 mg | INTRAMUSCULAR | Status: DC | PRN

## 2016-11-30 MED ORDER — SODIUM CHLORIDE 0.9 % IR SOLN
Status: DC | PRN
  Administered 2016-11-30: 1000 mL

## 2016-11-30 MED ORDER — LABETALOL HCL 5 MG/ML IV SOLN
INTRAVENOUS | Status: DC | PRN
  Administered 2016-11-30: 5 mg via INTRAVENOUS
  Administered 2016-11-30: 10 mg via INTRAVENOUS

## 2016-11-30 MED ORDER — ONDANSETRON HCL 4 MG/2ML IJ SOLN
INTRAMUSCULAR | Status: AC
  Filled 2016-11-30: qty 2

## 2016-11-30 MED ORDER — MEPERIDINE HCL 25 MG/ML IJ SOLN: 6.2500 mg | INTRAMUSCULAR | Status: DC | PRN

## 2016-11-30 MED ORDER — SODIUM CHLORIDE 0.9 % IR SOLN
Status: DC | PRN
  Administered 2016-11-30: 3000 mL

## 2016-11-30 MED ORDER — FENTANYL CITRATE (PF) 100 MCG/2ML IJ SOLN
INTRAMUSCULAR | Status: DC | PRN
  Administered 2016-11-30 (×3): 50 ug via INTRAVENOUS
  Administered 2016-11-30: 200 ug via INTRAVENOUS
  Administered 2016-11-30: 100 ug via INTRAVENOUS
  Administered 2016-11-30: 50 ug via INTRAVENOUS

## 2016-11-30 MED ORDER — LIDOCAINE HCL (CARDIAC) 20 MG/ML IV SOLN
INTRAVENOUS | Status: DC | PRN
  Administered 2016-11-30: 20 mg via INTRAVENOUS

## 2016-11-30 MED ORDER — PNEUMOCOCCAL VAC POLYVALENT 25 MCG/0.5ML IJ INJ
0.5000 mL | INJECTION | INTRAMUSCULAR | Status: AC
  Administered 2016-12-02: 0.5 mL via INTRAMUSCULAR
  Filled 2016-11-30: qty 0.5

## 2016-11-30 MED ORDER — PHENYLEPHRINE 40 MCG/ML (10ML) SYRINGE FOR IV PUSH (FOR BLOOD PRESSURE SUPPORT)
PREFILLED_SYRINGE | INTRAVENOUS | Status: AC
  Filled 2016-11-30: qty 10

## 2016-11-30 MED ORDER — MIDAZOLAM HCL 2 MG/2ML IJ SOLN: 0.5000 mg | Freq: Once | INTRAMUSCULAR | Status: DC | PRN

## 2016-11-30 MED ORDER — CEFAZOLIN SODIUM 10 G IJ SOLR
3.0000 g | INTRAMUSCULAR | Status: AC
  Administered 2016-11-30: 3 g via INTRAVENOUS
  Filled 2016-11-30: qty 3000

## 2016-11-30 MED ORDER — METOCLOPRAMIDE HCL 5 MG PO TABS: 5.0000 mg | ORAL_TABLET | Freq: Three times a day (TID) | ORAL | Status: DC | PRN

## 2016-11-30 MED ORDER — ROCURONIUM BROMIDE 100 MG/10ML IV SOLN
INTRAVENOUS | Status: DC | PRN
  Administered 2016-11-30: 50 mg via INTRAVENOUS

## 2016-11-30 MED ORDER — ONDANSETRON HCL 4 MG PO TABS: 4.0000 mg | ORAL_TABLET | Freq: Four times a day (QID) | ORAL | Status: DC | PRN

## 2016-11-30 MED ORDER — SUGAMMADEX SODIUM 200 MG/2ML IV SOLN
INTRAVENOUS | Status: DC | PRN
  Administered 2016-11-30: 300 mg via INTRAVENOUS

## 2016-11-30 SURGICAL SUPPLY — 80 items
ADH SKN CLS APL DERMABOND .7 (GAUZE/BANDAGES/DRESSINGS) ×1
APPLICATOR CHLORAPREP 3ML ORNG (MISCELLANEOUS) ×7 IMPLANT
BAG DECANTER FOR FLEXI CONT (MISCELLANEOUS) ×3 IMPLANT
BIT DRILL 4.3 (BIT) ×4 IMPLANT
BIT DRILL 4.3MM (BIT) ×2
BIT DRILL 4.3X300MM (BIT) IMPLANT
BNDG CMPR MED 15X6 ELC VLCR LF (GAUZE/BANDAGES/DRESSINGS) ×1
BNDG ELASTIC 6X15 VLCR STRL LF (GAUZE/BANDAGES/DRESSINGS) ×2 IMPLANT
BRUSH FEMORAL CANAL (MISCELLANEOUS) IMPLANT
CAP LOCK NCB (Cap) ×2 IMPLANT
DERMABOND ADVANCED (GAUZE/BANDAGES/DRESSINGS) ×2
DERMABOND ADVANCED .7 DNX12 (GAUZE/BANDAGES/DRESSINGS) ×1 IMPLANT
DRAPE C-ARM 35X43 STRL (DRAPES) ×2 IMPLANT
DRAPE C-ARMOR (DRAPES) ×2 IMPLANT
DRAPE HIP W/POCKET STRL (DRAPE) ×1 IMPLANT
DRAPE IMP U-DRAPE 54X76 (DRAPES) ×5 IMPLANT
DRAPE INCISE IOBAN 66X45 STRL (DRAPES) ×3 IMPLANT
DRAPE INCISE IOBAN 85X60 (DRAPES) ×3 IMPLANT
DRAPE ORTHO SPLIT 77X108 STRL (DRAPES)
DRAPE POUCH INSTRU U-SHP 10X18 (DRAPES) ×1 IMPLANT
DRAPE SURG ORHT 6 SPLT 77X108 (DRAPES) IMPLANT
DRAPE U-SHAPE 47X51 STRL (DRAPES) ×5 IMPLANT
DRILL BIT 4.3 (BIT) ×3
DRSG AQUACEL AG ADV 3.5X14 (GAUZE/BANDAGES/DRESSINGS) ×2 IMPLANT
DURAPREP 26ML APPLICATOR (WOUND CARE) ×1 IMPLANT
ELECT CAUTERY BLADE 6.4 (BLADE) ×3 IMPLANT
ELECT REM PT RETURN 9FT ADLT (ELECTROSURGICAL) ×3
ELECTRODE REM PT RTRN 9FT ADLT (ELECTROSURGICAL) ×1 IMPLANT
GLOVE BIOGEL PI IND STRL 6.5 (GLOVE) IMPLANT
GLOVE BIOGEL PI IND STRL 7.0 (GLOVE) IMPLANT
GLOVE BIOGEL PI IND STRL 8.5 (GLOVE) ×1 IMPLANT
GLOVE BIOGEL PI INDICATOR 6.5 (GLOVE) ×10
GLOVE BIOGEL PI INDICATOR 7.0 (GLOVE) ×2
GLOVE BIOGEL PI INDICATOR 8.5 (GLOVE) ×2
GLOVE SKINSENSE N 8.5 STRL (GLOVE) ×6 IMPLANT
GLOVE SKINSENSE NS SZ6.5 (GLOVE) ×6
GLOVE SKINSENSE STRL SZ6.5 (GLOVE) IMPLANT
GLOVE SURG SS PI 6.5 STRL IVOR (GLOVE) ×2 IMPLANT
GLOVE SURG SS PI 8.5 STRL IVOR (GLOVE) ×4
GLOVE SURG SS PI 8.5 STRL STRW (GLOVE) IMPLANT
GOWN STRL REUS W/ TWL LRG LVL3 (GOWN DISPOSABLE) ×2 IMPLANT
GOWN STRL REUS W/TWL 2XL LVL3 (GOWN DISPOSABLE) ×3 IMPLANT
GOWN STRL REUS W/TWL LRG LVL3 (GOWN DISPOSABLE) ×6
HANDPIECE INTERPULSE COAX TIP (DISPOSABLE)
HOOD W/PEELAWAY (MISCELLANEOUS) ×2 IMPLANT
K-WIRE 2.0 (WIRE) ×3
K-WIRE FXSTD 280X2XNS SS (WIRE) ×1
KIT BASIN OR (CUSTOM PROCEDURE TRAY) ×3 IMPLANT
KIT ROOM TURNOVER OR (KITS) ×3 IMPLANT
KWIRE FXSTD 280X2XNS SS (WIRE) IMPLANT
MANIFOLD NEPTUNE II (INSTRUMENTS) ×3 IMPLANT
NS IRRIG 1000ML POUR BTL (IV SOLUTION) ×4 IMPLANT
PACK TOTAL JOINT (CUSTOM PROCEDURE TRAY) ×3 IMPLANT
PACK UNIVERSAL I (CUSTOM PROCEDURE TRAY) ×3 IMPLANT
PAD ARMBOARD 7.5X6 YLW CONV (MISCELLANEOUS) ×6 IMPLANT
PLATE DIST FEM 12H (Plate) ×2 IMPLANT
SCREW 5.0 80MM (Screw) ×2 IMPLANT
SCREW CORTICAL NCB 5.0X65 (Screw) ×2 IMPLANT
SCREW NCB 3.5X75X5X6.2XST (Screw) IMPLANT
SCREW NCB 5.0X36MM (Screw) ×4 IMPLANT
SCREW NCB 5.0X38 (Screw) ×4 IMPLANT
SCREW NCB 5.0X55MM (Screw) ×2 IMPLANT
SCREW NCB 5.0X75MM (Screw) ×6 IMPLANT
SEALER BIPOLAR AQUA 6.0 (INSTRUMENTS) ×1 IMPLANT
SET HNDPC FAN SPRY TIP SCT (DISPOSABLE) IMPLANT
SUT ETHIBOND NAB CT1 #1 30IN (SUTURE) ×3 IMPLANT
SUT ETHILON 3 0 FSL (SUTURE) ×2 IMPLANT
SUT ETHILON 3 0 PS 1 (SUTURE) ×2 IMPLANT
SUT MON AB 2-0 CT1 36 (SUTURE) ×3 IMPLANT
SUT VIC AB 1 CT1 27 (SUTURE) ×3
SUT VIC AB 1 CT1 27XBRD ANBCTR (SUTURE) ×3 IMPLANT
SUT VIC AB 2-0 CT1 27 (SUTURE) ×3
SUT VIC AB 2-0 CT1 TAPERPNT 27 (SUTURE) ×2 IMPLANT
SUT VLOC 180 0 24IN GS25 (SUTURE) ×1 IMPLANT
TOWEL OR 17X24 6PK STRL BLUE (TOWEL DISPOSABLE) ×3 IMPLANT
TOWEL OR 17X26 10 PK STRL BLUE (TOWEL DISPOSABLE) ×3 IMPLANT
TOWER CARTRIDGE SMART MIX (DISPOSABLE) IMPLANT
TRAY FOLEY W/METER SILVER 16FR (SET/KITS/TRAYS/PACK) ×3 IMPLANT
WATER STERILE IRR 1000ML POUR (IV SOLUTION) ×4 IMPLANT
WRAP KNEE MAXI GEL POST OP (GAUZE/BANDAGES/DRESSINGS) ×2 IMPLANT

## 2016-11-30 NOTE — Progress Notes (Signed)
Orthopedic Tech Progress Note Patient Details:  [name] 09-Oct-1950 [id number]  pt has a special bed (low bed) and it does not allow for overhead frame set up.  Unable to apply Overhead frame at this time.    Kristopher Oppenheim 11/30/2016, 5:27 PM

## 2016-11-30 NOTE — Discharge Instructions (Signed)
Orthopedic discharge instructions: #1 nonweightbearing left lower extremity. #2 unrestricted range of motion of left hip and knee. #3 complete 30 total days of Lovenox for DVT prophylaxis. #4 return to the office 2 weeks after discharge for suture removal. Please call [phone] to schedule this appointment.

## 2016-11-30 NOTE — Op Note (Signed)
OPERATIVE REPORT   11/30/2016  1:53 PM  PATIENT:  [name]   SURGEON:  Daniesha Driver, Horald Pollen, MD  ASSISTANT:  April Green, RNFA.   PREOPERATIVE DIAGNOSIS:  Left intra-articular distal femur fracture.   POSTOPERATIVE DIAGNOSIS:  Same.  PROCEDURE:  OPEN REDUCTION INTERNAL FIXATION (ORIF) LEFT DISTAL FEMUR FRACTURE  ANESTHESIA:   GETA.  ANTIBIOTICS:  3 g Ancef.  IMPLANTS:  Zimmer NCB Periprosthetic distal femur plate, 12 hole. 5.0 mm screws 9. Locking caps 5.  ESTIMATED BLOOD LOSS: 150 mL.  SPECIMENS:  None.  COMPLICATIONS:  None.  DISPOSITION:  Stable to PACU.  SURGICAL INDICATIONS:  [name] is a 66 y.o. female with multiple medical problems including morbid obesity and type 2 diabetes. She sustained a ground-level fall yesterday and heard a pop in her left knee. She was unable to weight-bear. She was taken to the emergency department at [facility], where x-rays and a CT scan revealed an intra-articular left distal femur fracture. She was then transferred to [facility]. Given the intra-articular nature of the fracture and high risk for displacement, she was indicated for open reduction internal fixation.  The risks, benefits, and alternatives were discussed with the patient preoperatively including but not limited to the risks of infection, bleeding, nerve / blood vessel injury, malunion, nonunion, cardiopulmonary complications, the need for repeat surgery, among others, and the patient was willing to proceed.  PROCEDURE IN DETAIL: I identified the patient in the holding area using 2 identifiers. The surgical site was marked by myself. She was taken to the operating room and placed supine on the operative table. Gen. anesthesia was induced without difficulty.  The left lower extremity was prepped and draped in normal sterile surgical fashion. Timeout was called verifying side and site of surgery. IV antibiotics were given within 60  minutes of beginning the procedure.  I began by using fluoroscopy to define her anatomy. The distal femur was marked on fluoroscopy. I created a longitudinal incision over the lateral aspect of the femur. Blunt dissection was performed until I reached the IT band. I split the IT band in line with the fibers. A Cobb elevator was used to elevate the vastus lateralis anteriorly off the intramuscular septum. I selected a 12 hole plate which was attached the jig. I slid the plate up subperiosteally. The plate was localized on fluoroscopy. A drill bit was used to hold the plate proximally. A K wire was used to hold the plate distally. The position of the plate was checked on fluoroscopy AP and lateral views  I then used standard AO technique to compress the plate to the bone both proximally and distally. A total of 4 bicortical screws were placed in the femoral shaft proximally through stab incisions. Distally, a total of 4 bicortical screws were placed in the articular block. I placed a fifth screw which I was able to lag across the fracture site. The fracture keyed in anatomically and compressed very nicely. I applied locking caps to the 5 distal screws.  Final AP and lateral fluoroscopy views were used to confirm fracture reduction and hardware placement. The wounds were copiously irrigated with saline.  The wounds were closed in layers with #1 Vicryl for the fascia, 2-0 Monocryl for the deep dermal layer, and 2-0 nylon mattress suture for the skin. Aquasol dressing was applied followed by compressive Ace wrap. Patient was then extubated and taken to the PACU in stable condition. Sponge, needle, and instrument counts were corrected  at the end of the case 2. There were no known complications.  POSTOPERATIVE PLAN:  Postoperatively, we will readmit the patient to the hospitalist. She'll be nonweightbearing left lower extremity with a walker and wheelchair. She may have unrestricted range of motion of the hip and  knee. We'll place her on Lovenox for DVT prophylaxis. She'll work with physical and occupational therapy and undergo disposition planning. I will need to see her in the office 2 weeks after discharge for suture removal.

## 2016-11-30 NOTE — Transfer of Care (Signed)
Immediate Anesthesia Transfer of Care Note  Patient: [name]  Procedure(s) Performed: OPEN REDUCTION INTERNAL FIXATION (ORIF) DISTAL FEMUR FRACTURE (Left Leg Upper)  Patient Location: PACU  Anesthesia Type:General  Level of Consciousness: awake and alert   Airway & Oxygen Therapy: Patient Spontanous Breathing and Patient connected to face mask oxygen  Post-op Assessment: Report given to RN, Post -op Vital signs reviewed and stable and Patient moving all extremities X 4  Post vital signs: Reviewed and stable  Last Vitals:  Vitals:   11/29/16 2135 11/30/16 0512  BP: (!) 150/75 125/68  Pulse: 94 (!) 106  Resp: 18 16  Temp: 37.9 C 37.7 C  SpO2: 93% 92%    Last Pain:  Vitals:   11/30/16 0623  TempSrc:   PainSc: 5       Patients Stated Pain Goal: 3 (30[birth year] 2633)  Complications: No apparent anesthesia complications

## 2016-11-30 NOTE — Anesthesia Preprocedure Evaluation (Addendum)
Anesthesia Evaluation  Patient identified by MRN, date of birth, ID band Patient awake    Reviewed: Allergy & Precautions, NPO status , Patient's Chart, lab work & pertinent test results  History of Anesthesia Complications Negative for: history of anesthetic complications  Airway Mallampati: II  TM Distance: >3 FB Neck ROM: Full    Dental  (+) Edentulous Upper, Edentulous Lower   Pulmonary former smoker,    breath sounds clear to auscultation       Cardiovascular hypertension, Pt. on medications  Rhythm:Regular Rate:Normal     Neuro/Psych Anxiety Bipolar Disorder CVA (weak on L side), Residual Symptoms    GI/Hepatic Neg liver ROS, GERD  Medicated and Controlled,  Endo/Other  diabetes (glu 100), Insulin Dependent, Oral Hypoglycemic AgentsMorbid obesity  Renal/GU negative Renal ROS     Musculoskeletal  (+) Arthritis , Osteoarthritis,    Abdominal (+) + obese,   Peds  Hematology negative hematology ROS (+)   Anesthesia Other Findings   Reproductive/Obstetrics                            Anesthesia Physical Anesthesia Plan  ASA: III  Anesthesia Plan: General   Post-op Pain Management:    Induction: Intravenous  PONV Risk Score and Plan: 4 or greater and Ondansetron, Dexamethasone, Midazolam and Scopolamine patch - Pre-op  Airway Management Planned: Oral ETT  Additional Equipment:   Intra-op Plan:   Post-operative Plan: Extubation in OR  Informed Consent: I have reviewed the patients History and Physical, chart, labs and discussed the procedure including the risks, benefits and alternatives for the proposed anesthesia with the patient or authorized representative who has indicated his/her understanding and acceptance.     Plan Discussed with: CRNA and Surgeon  Anesthesia Plan Comments: (Plan routine monitors, GETA)        Anesthesia Quick Evaluation

## 2016-11-30 NOTE — Interval H&P Note (Signed)
History and Physical Interval Note:  11/30/2016 12:05 PM  [name]  has presented today for surgery, with the diagnosis of distal femur fracture   The various methods of treatment have been discussed with the patient and family. After consideration of risks, benefits and other options for treatment, the patient has consented to  Procedure(s): OPEN REDUCTION INTERNAL FIXATION (ORIF) DISTAL FEMUR FRACTURE (Left) as a surgical intervention .  The patient's history has been reviewed, patient examined, no change in status, stable for surgery.  I have reviewed the patient's chart and labs.  Questions were answered to the patient's satisfaction.    The risks, benefits, and alternatives were discussed with the patient. There are risks associated with the surgery including, but not limited to, problems with anesthesia (death), infection, differences in leg length/angulation/rotation, fracture of bones, loosening or failure of implants, malunion, nonunion, hematoma (blood accumulation) which may require surgical drainage, blood clots, pulmonary embolism, nerve injury (foot drop), and blood vessel injury. The patient understands these risks and elects to proceed.    Caycee Wanat, Horald Pollen

## 2016-11-30 NOTE — Progress Notes (Signed)
PROGRESS NOTE  [name] AVW:[id number] DOB: 1950/06/25 DOA: 11/29/2016 PCP: Lucia Gaskins, MD  HPI/Recap of past 28 hours: 66 year old female with past medical history of bipolar disorder, diabetes, hypertension and morbid obesity sustained a mechanical fall on 10/8 hitting the back of her leg against her toilet. She was unable to stand and she was brought into the emergency room at [facility]. There she was found to have a nondisplaced fracture of the distal femur. Orthopedics on-call requested patient be transferred to [facility] for operative repair.  No events overnight. Patient for surgery later today. Currently she is resting comfortably with no complaints.  Assessment/Plan: Principal Problem:   Other fracture of left femur, follow-up encounter for closed fracture [facility]): for surgery today. Active Problems:   Morbid obesity [facility]): Patient meets criteria with BMI greater than 40  essential hypertension: Stable, continue home medications  Bipolar disorder: Continue Risperdal, Celexa and Cogentin.  Diabetes mellitus on long-term insulin, type II, well-controlled: Nothing by mouth after midnight. Sliding-scale and reduced rate of insulin.  Hyperlipidemia: Continue statin  Code Status: full code   Family Communication: left message with family   Disposition Plan: depending on surgery, anticipate will be here for several days and likely short-term skilled nursing    Consultants:  Orthopedic surgery   Procedures:  Plan for femur repair10/9   Antimicrobials:  Ancef preop   DVT prophylaxis:  SCDs   Objective: Vitals:   11/29/16 1700 11/29/16 1718 11/29/16 2135 11/30/16 0512  BP: (!) 160/85 (!) 160/85 (!) 150/75 125/68  Pulse: 88 88 94 (!) 106  Resp: 15 16 18 16   Temp: 98 F (36.7 C) 98 F (36.7 C) 100.3 F (37.9 C) 99.8 F (37.7 C)  TempSrc:   Oral Oral  SpO2: 90% 90% 93% 92%  Weight:      Height:        Intake/Output Summary  (Last 24 hours) at 11/30/16 1344 Last data filed at 11/30/16 1336  Gross per 24 hour  Intake             1525 ml  Output             1500 ml  Net               25 ml   Filed Weights   11/29/16 0638  Weight: 127 kg (280 lb)    Exam:   General:  Somnolent, no acute distress   HEENT: Normocephalic, atraumatic, mucous membranes are slightly dry  Cardiovascular: regular rate and rhythm, S1-S2   Respiratory: clear to auscultation bilaterally , breathing not labored  Abdomen: soft, obese, nontender, positive bowel sounds   Musculoskeletal: no clubbing or cyanosis, trace pitting edema   Skin: bruising noted, but no skin breaks, tears or lesions  Psychiatry: appropriate, no evidence of acute psychoses  Neuro: No focal deficits    Data Reviewed: CBC:  Recent Labs Lab 11/29/16 0945 11/29/16 1905 11/30/16 0531  WBC 13.7* 12.1* 11.2*  NEUTROABS 10.8*  --   --   HGB 13.9 13.4 12.8  HCT 42.0 39.9 37.7  MCV 86.6 84.9 85.7  PLT 283 260 537   Basic Metabolic Panel:  Recent Labs Lab 11/29/16 0945 11/29/16 1905 11/30/16 0531  NA 138  --  139  K 3.7  --  3.7  CL 103  --  103  CO2 25  --  27  GLUCOSE 135*  --  114*  BUN 12  --  11  CREATININE 0.93 0.89 0.91  CALCIUM 9.2  --  8.8*   GFR: Estimated Creatinine Clearance: 81 mL/min (by C-G formula based on SCr of 0.91 mg/dL). Liver Function Tests:  Recent Labs Lab 11/29/16 0945  AST 24  ALT 23  ALKPHOS 85  BILITOT 0.6  PROT 7.3  ALBUMIN 3.9   No results for input(s): LIPASE, AMYLASE in the last 168 hours. No results for input(s): AMMONIA in the last 168 hours. Coagulation Profile: No results for input(s): INR, PROTIME in the last 168 hours. Cardiac Enzymes: No results for input(s): CKTOTAL, CKMB, CKMBINDEX, TROPONINI in the last 168 hours. BNP (last 3 results) No results for input(s): PROBNP in the last 8760 hours. HbA1C:  Recent Labs  11/29/16 1905  HGBA1C 6.2*   CBG:  Recent Labs Lab  11/29/16 2139 11/30/16 0648 11/30/16 1031  GLUCAP 161* 100* 100*   Lipid Profile: No results for input(s): CHOL, HDL, LDLCALC, TRIG, CHOLHDL, LDLDIRECT in the last 72 hours. Thyroid Function Tests: No results for input(s): TSH, T4TOTAL, FREET4, T3FREE, THYROIDAB in the last 72 hours. Anemia Panel: No results for input(s): VITAMINB12, FOLATE, FERRITIN, TIBC, IRON, RETICCTPCT in the last 72 hours. Urine analysis:    Component Value Date/Time   COLORURINE YELLOW 03/29/2010 0931   APPEARANCEUR HAZY (A) 03/29/2010 0931   LABSPEC 1.010 03/29/2010 0931   PHURINE 6.0 03/29/2010 0931   GLUCOSEU NEGATIVE 01/12/2008 1642   HGBUR MODERATE (A) 03/29/2010 0931   BILIRUBINUR NEGATIVE 03/29/2010 0931   KETONESUR NEGATIVE 03/29/2010 0931   PROTEINUR NEGATIVE 03/29/2010 0931   UROBILINOGEN 0.2 03/29/2010 0931   NITRITE NEGATIVE 03/29/2010 0931   LEUKOCYTESUR NEGATIVE 03/29/2010 0931   Sepsis Labs: @LABRCNTIP (procalcitonin:4,lacticidven:4)  ) Recent Results (from the past 240 hour(s))  Surgical pcr screen     Status: None   Collection Time: 11/29/16 10:19 PM  Result Value Ref Range Status   MRSA, PCR NEGATIVE NEGATIVE Final   Staphylococcus aureus NEGATIVE NEGATIVE Final    Comment: (NOTE) The Xpert SA Assay (FDA approved for NASAL specimens in patients 36 years of age and older), is one component of a comprehensive surveillance program. It is not intended to diagnose infection nor to guide or monitor treatment.       Studies: No results found.  Scheduled Meds: . [MAR Hold] ALPRAZolam  0.5 mg Oral Daily  . [MAR Hold] aspirin EC  81 mg Oral Daily  . [MAR Hold] benztropine  1 mg Oral QHS  . chlorhexidine  60 mL Topical Once  . [MAR Hold] citalopram  40 mg Oral Daily  . [MAR Hold] heparin  5,000 Units Subcutaneous Q8H  . [MAR Hold] insulin aspart  0-5 Units Subcutaneous QHS  . [MAR Hold] insulin aspart  0-9 Units Subcutaneous TID WC  . [MAR Hold] insulin detemir  24 Units  Subcutaneous QHS  . [MAR Hold] lisinopril  20 mg Oral Daily  . [START ON 12/01/2016] pneumococcal 23 valent vaccine  0.5 mL Intramuscular Tomorrow-1000  . povidone-iodine  2 application Topical Once  . [MAR Hold] pravastatin  20 mg Oral q1800  . [MAR Hold] risperiDONE  2 mg Oral QHS  . [MAR Hold] sodium chloride flush  3 mL Intravenous Q12H    Continuous Infusions: . [MAR Hold] sodium chloride    . sodium chloride 75 mL/hr at 11/30/16 0000  . lactated ringers 10 mL/hr at 11/30/16 1112     LOS: 1 day     Annita Brod, MD [facility] Pager [phone]   If 7PM-7AM,  please contact night-coverage [email or web address] Password [email or web address] 11/30/2016, 1:44 PM

## 2016-11-30 NOTE — H&P (View-Only) (Signed)
Reason for Consult:  Left knee pain Referring Physician: Dr. Merrell  [name] is an 66 y.o. female.  HPI: 66 y/o female with PMH of diabetes and knee arthritis c/o pain in the left knee that has been bothering her for several days.  She was was walking out of the bathroom at home ths morning when her knee gave way and she fell.  She landed on her left side and had an immediate increase in pain.  She c/o aching pain in the left knee that is worse with motion and better with rest.  She denies any surgery on that knee in the past.  She takes no blood thinners.  No recent changes in her health.  Past Medical History:  Diagnosis Date  . Bipolar 1 disorder (HCC)   . Cancer (HCC)   . Diabetes mellitus without complication (HCC)   . Hypertension     Past Surgical History:  Procedure Laterality Date  . ABDOMINAL HYSTERECTOMY    . CHOLECYSTECTOMY    . TONSILLECTOMY      History reviewed. No pertinent family history.  Social History:  reports that she has quit smoking. She has never used smokeless tobacco. She reports that she does not drink alcohol or use drugs.  Allergies:  Allergies  Allergen Reactions  . Sulfa Antibiotics     Medications: I have reviewed the patient's current medications.  Results for orders placed or performed during the hospital encounter of 11/29/16 (from the past 48 hour(s))  CBC with Differential/Platelet     Status: Abnormal   Collection Time: 11/29/16  9:45 AM  Result Value Ref Range   WBC 13.7 (H) 4.0 - 10.5 K/uL   RBC 4.85 3.87 - 5.11 MIL/uL   Hemoglobin 13.9 12.0 - 15.0 g/dL   HCT 42.0 36.0 - 46.0 %   MCV 86.6 78.0 - 100.0 fL   MCH 28.7 26.0 - 34.0 pg   MCHC 33.1 30.0 - 36.0 g/dL   RDW 14.1 11.5 - 15.5 %   Platelets 283 150 - 400 K/uL   Neutrophils Relative % 79 %   Neutro Abs 10.8 (H) 1.7 - 7.7 K/uL   Lymphocytes Relative 15 %   Lymphs Abs 2.0 0.7 - 4.0 K/uL   Monocytes Relative 5 %   Monocytes Absolute 0.7 0.1 - 1.0 K/uL   Eosinophils  Relative 1 %   Eosinophils Absolute 0.1 0.0 - 0.7 K/uL   Basophils Relative 0 %   Basophils Absolute 0.1 0.0 - 0.1 K/uL  Comprehensive metabolic panel     Status: Abnormal   Collection Time: 11/29/16  9:45 AM  Result Value Ref Range   Sodium 138 135 - 145 mmol/L   Potassium 3.7 3.5 - 5.1 mmol/L   Chloride 103 101 - 111 mmol/L   CO2 25 22 - 32 mmol/L   Glucose, Bld 135 (H) 65 - 99 mg/dL   BUN 12 6 - 20 mg/dL   Creatinine, Ser 0.93 0.44 - 1.00 mg/dL   Calcium 9.2 8.9 - 10.3 mg/dL   Total Protein 7.3 6.5 - 8.1 g/dL   Albumin 3.9 3.5 - 5.0 g/dL   AST 24 15 - 41 U/L   ALT 23 14 - 54 U/L   Alkaline Phosphatase 85 38 - 126 U/L   Total Bilirubin 0.6 0.3 - 1.2 mg/dL   GFR calc non Af Amer >60 >60 mL/min   GFR calc Af Amer >60 >60 mL/min    Comment: (NOTE) The eGFR has been   calculated using the CKD EPI equation. This calculation has not been validated in all clinical situations. eGFR's persistently <60 mL/min signify possible Chronic Kidney Disease.    Anion gap 10 5 - 15    Ct Knee Left Wo Contrast  Result Date: 11/29/2016 CLINICAL DATA:  Left knee pain with a fracture due to a fall today. Initial encounter. EXAM: CT OF THE LEFT KNEE WITHOUT CONTRAST TECHNIQUE: Multidetector CT imaging of the left knee was performed according to the standard protocol. Multiplanar CT image reconstructions were also generated. COMPARISON:  Plain films left knee this same day. FINDINGS: Bones/Joint/Cartilage As seen on the comparison plain films, the patient has a fracture of the distal femur. The fracture originates in the posterior cortex of the femoral diaphysis approximately 8 cm above the weight-bearing lateral femoral condyle. The fracture extends in an inferior and medial orientation through the central aspect of the intercondylar notch. The fracture is nondisplaced. No other fracture is identified. Joint space narrowing about the knee is worst medially. Tricompartmental osteophytosis is identified.  Ligaments Suboptimally assessed by CT.  Appear intact. Muscles and Tendons Intact. Soft tissues Small Baker's cyst is noted. IMPRESSION: Nondisplaced fracture of the distal femur originates in the lateral, posterior cortex of the diaphysis and extends through the central aspect of the intercondylar notch of the femur. No other acute abnormality. Tricompartmental osteoarthritis. Electronically Signed   By: Thomas  Dalessio M.D.   On: 11/29/2016 09:43   Dg Chest Port 1 View  Result Date: 11/29/2016 CLINICAL DATA:  Hypoxia EXAM: PORTABLE CHEST 1 VIEW COMPARISON:  03/27/2010 FINDINGS: Mild elevation the right hemidiaphragm. Heart is upper limits normal in size. No confluent airspace opacities or effusions. Limited study by body habitus. IMPRESSION: Mildly elevated right hemidiaphragm.  No active disease. Electronically Signed   By: Kevin  Dover M.D.   On: 11/29/2016 10:32   Dg Knee Complete 4 Views Left  Result Date: 11/29/2016 CLINICAL DATA:  66-year-old female post fall with left knee pain. Initial encounter. EXAM: LEFT KNEE - COMPLETE 4+ VIEW COMPARISON:  None. FINDINGS: Slightly oblique vertical fracture extends from the distal left femoral medial diametaphysis into the central articular aspect of the distal femur (adjacent to tibial spine) spanning over 8.6 cm. Small joint effusion. Moderate medial tibiofemoral joint space and patellofemoral joint degenerative changes. IMPRESSION: Slightly oblique vertical fracture extends from the distal left femoral medial diametaphysis into the central articular aspect of the distal femur (adjacent to tibial spine) spanning over 8.6 cm. Small joint effusion. Electronically Signed   By: Steven  Olson M.D.   On: 11/29/2016 07:56    ROS:  No recent f/c/n/v/wt loss PE:  Blood pressure (!) 160/85, pulse 88, temperature 98 F (36.7 C), resp. rate 16, height 5' 3" (1.6 m), weight 127 kg (280 lb), SpO2 90 %. wn wd overweight female in nad.  A and O x 4.  Mood and affect  normal.  EOMI.  resp unlabored.  L knee TTP with an effusion.  No gross deformity.  Skin heatlhy and intact.  No lymphadenopathy.  5/5 strength in PF and DF of the ankle and toes.  Sens to LT intact at the dorsal and plantar forefoot.  Assessment/Plan: L femur intercondylar fracture - closed and non displaced - I explained the nature of the injury to the patient in detail.  This fracture is at extremely high risk of displacement.  I believe it's medically necessary to consider operative treatment.  Dr. Swinteck will plan to operate tomorrow.  She can   eat tonight and be NPO after midnight for surgery tmorrow.  Hold blood thinners.    The patient understands the plan and agrees.  The risks and benefits of the alternative treatment options have been discussed in detail.  The patient wishes to proceed with surgery and specifically understands risks of bleeding, infection, nerve damage, blood clots, need for additional surgery, amputation and death.   ,  11/29/2016, 7:12 PM      

## 2016-11-30 NOTE — Anesthesia Postprocedure Evaluation (Signed)
Anesthesia Post Note  Patient: [name]  Procedure(s) Performed: OPEN REDUCTION INTERNAL FIXATION (ORIF) DISTAL FEMUR FRACTURE (Left Leg Upper)     Patient location during evaluation: PACU Anesthesia Type: General Level of consciousness: awake and alert, patient cooperative and oriented Pain management: pain level controlled Vital Signs Assessment: post-procedure vital signs reviewed and stable Respiratory status: spontaneous breathing, nonlabored ventilation, respiratory function stable and patient connected to nasal cannula oxygen Cardiovascular status: blood pressure returned to baseline and stable Postop Assessment: no apparent nausea or vomiting Anesthetic complications: no    Last Vitals:  Vitals:   11/30/16 1528 11/30/16 1545  BP:    Pulse: 91 96  Resp: 16 20  Temp:  36.7 C  SpO2: 90% 96%    Last Pain:  Vitals:   11/30/16 1545  TempSrc:   PainSc: 4     LLE Motor Response: Purposeful movement;Responds to commands (11/30/16 1545) LLE Sensation: Full sensation (11/30/16 1545)          Akya Fiorello,E. Farouk Vivero

## 2016-11-30 NOTE — Anesthesia Procedure Notes (Signed)
Procedure Name: Intubation Date/Time: 11/30/2016 12:34 PM Performed by: Rejeana Brock L Pre-anesthesia Checklist: Patient identified, Emergency Drugs available, Suction available and Patient being monitored Patient Re-evaluated:Patient Re-evaluated prior to induction Oxygen Delivery Method: Circle System Utilized Preoxygenation: Pre-oxygenation with 100% oxygen Induction Type: IV induction Ventilation: Mask ventilation without difficulty Laryngoscope Size: Mac and 3 Grade View: Grade I Tube type: Oral Tube size: 7.0 mm Number of attempts: 1 Airway Equipment and Method: Stylet and Oral airway Placement Confirmation: ETT inserted through vocal cords under direct vision,  positive ETCO2 and breath sounds checked- equal and bilateral Secured at: 22 cm Tube secured with: Tape Dental Injury: Teeth and Oropharynx as per pre-operative assessment

## 2016-12-01 ENCOUNTER — Encounter (HOSPITAL_COMMUNITY): Payer: Self-pay | Admitting: Orthopedic Surgery

## 2016-12-01 ENCOUNTER — Inpatient Hospital Stay (HOSPITAL_COMMUNITY): Payer: Medicare HMO

## 2016-12-01 LAB — GLUCOSE, CAPILLARY
GLUCOSE-CAPILLARY: 182 mg/dL — AB (ref 65–99)
Glucose-Capillary: 151 mg/dL — ABNORMAL HIGH (ref 65–99)
Glucose-Capillary: 181 mg/dL — ABNORMAL HIGH (ref 65–99)
Glucose-Capillary: 157 mg/dL — ABNORMAL HIGH (ref 65–99)
Glucose-Capillary: 96 mg/dL (ref 65–99)

## 2016-12-01 LAB — BASIC METABOLIC PANEL
Anion gap: 11 (ref 5–15)
Anion gap: 12 (ref 5–15)
BUN: 10 mg/dL (ref 6–20)
BUN: 10 mg/dL (ref 6–20)
CALCIUM: 8.5 mg/dL — AB (ref 8.9–10.3)
CALCIUM: 8.9 mg/dL (ref 8.9–10.3)
CHLORIDE: 99 mmol/L — AB (ref 101–111)
CO2: 26 mmol/L (ref 22–32)
CO2: 28 mmol/L (ref 22–32)
CREATININE: 0.85 mg/dL (ref 0.44–1.00)
CREATININE: 1.02 mg/dL — AB (ref 0.44–1.00)
Chloride: 99 mmol/L — ABNORMAL LOW (ref 101–111)
GFR calc Af Amer: >60 mL/min (ref >60)
GFR calc Af Amer: >60 mL/min (ref >60)
GFR calc non Af Amer: >60 mL/min (ref >60)
GFR calc non Af Amer: 57 mL/min — ABNORMAL LOW (ref >60)
GLUCOSE: 152 mg/dL — AB (ref 65–99)
Glucose, Bld: 156 mg/dL — ABNORMAL HIGH (ref 65–99)
Potassium: 5.3 mmol/L — ABNORMAL HIGH (ref 3.5–5.1)
Potassium: 4.2 mmol/L (ref 3.5–5.1)
Sodium: 136 mmol/L (ref 135–145)
Sodium: 139 mmol/L (ref 135–145)

## 2016-12-01 LAB — CBC
HCT: 36.7 % (ref 36.0–46.0)
Hemoglobin: 12.3 g/dL (ref 12.0–15.0)
MCH: 29 pg (ref 26.0–34.0)
MCHC: 33.5 g/dL (ref 30.0–36.0)
MCV: 86.6 fL (ref 78.0–100.0)
Platelets: 260 10*3/uL (ref 150–400)
RBC: 4.24 MIL/uL (ref 3.87–5.11)
RDW: 14.4 % (ref 11.5–15.5)
WBC: 15.2 10*3/uL — ABNORMAL HIGH (ref 4.0–10.5)

## 2016-12-01 MED ORDER — ENOXAPARIN SODIUM 40 MG/0.4ML ~~LOC~~ SOLN: 40.0000 mg | SUBCUTANEOUS | 0 refills | Status: DC

## 2016-12-01 MED ORDER — DEXTROSE 5 % IV SOLN
500.0000 mg | INTRAVENOUS | Status: DC
  Administered 2016-12-01 – 2016-12-02 (×2): 500 mg via INTRAVENOUS
  Filled 2016-12-01 (×3): qty 500

## 2016-12-01 MED ORDER — HYDROCODONE-ACETAMINOPHEN 5-325 MG PO TABS: 1.0000 | ORAL_TABLET | Freq: Four times a day (QID) | ORAL | 0 refills | Status: DC | PRN

## 2016-12-01 MED ORDER — DEXTROSE 5 % IV SOLN
1.0000 g | INTRAVENOUS | Status: DC
  Administered 2016-12-01 – 2016-12-02 (×2): 1 g via INTRAVENOUS
  Filled 2016-12-01 (×3): qty 10

## 2016-12-01 NOTE — Progress Notes (Signed)
Rehab Admissions Coordinator Note:  Patient was screened by Retta Diones for appropriateness for an Inpatient Acute Rehab Consult.  At this time, we are recommending [facility].  Given current diagnosis, it is unlikely that we would get approval from insurance carrier for acute inpatient rehab admission.  In addition, we have limited beds available this week.  However, if you wish patient to be considered for CIR, please feel free to order an inpatient rehab consult.  Retta Diones 12/01/2016, 12:42 PM  I can be reached at [phone].

## 2016-12-01 NOTE — NC FL2 (Deleted)
[place] LEVEL OF CARE SCREENING TOOL     IDENTIFICATION  Patient Name: [name] Birthdate: December 07, 1950 Sex: female Admission Date (Current Location): 11/29/2016  [facility] and [place] Number:      Facility and Address:  [facility] [place], [place] [address] [place], [place] [address]      Provider Number: [id number]  Attending Physician Name and Address:  Elmarie Shiley, MD  Relative Name and Phone Number:       Current Level of Care: Hospital Recommended Level of Care: [place] Prior Approval Number:    Date Approved/Denied: 12/01/16 PASRR Number: [id number]  Discharge Plan: SNF    Current Diagnoses: Patient Active Problem List   Diagnosis Date Noted  . Hypertension 11/30/2016  . Bipolar 1 disorder ([place]) 11/30/2016  . Diabetes mellitus without complication ([place]) 78/46/9629  . Closed fracture of left distal femur ([place]) 11/30/2016  . Other fracture of left femur, initial encounter for closed fracture ([place]) 11/29/2016  . Morbid obesity ([place]) 11/29/2016    Orientation RESPIRATION BLADDER Height & Weight     Self, Time, Situation, Place  O2 (Nasal Cannula) Continent Weight: 280 lb (127 kg) Height:  5\' 3"  (160 cm)  BEHAVIORAL SYMPTOMS/MOOD NEUROLOGICAL BOWEL NUTRITION STATUS      Continent Diet (See DC Summary)  AMBULATORY STATUS COMMUNICATION OF NEEDS Skin   Extensive Assist Verbally Surgical wounds (Left Leg, Compression Wrap)                       Personal Care Assistance Level of Assistance  Dressing, Bathing Bathing Assistance: Maximum assistance   Dressing Assistance: Maximum assistance     Functional Limitations Info             SPECIAL CARE FACTORS FREQUENCY  OT (By licensed OT), PT (By licensed PT)     PT Frequency: 3x week OT Frequency: 2x week            Contractures      Additional Factors Info  Code Status, Allergies, Psychotropic, Insulin Sliding Scale Code Status  Info: Full code Allergies Info: LATEX, SULFA ANTIBIOTICS  Psychotropic Info: Risperidone Insulin Sliding Scale Info: Insulin daily       Current Medications (12/01/2016):  This is the current hospital active medication list Current Facility-Administered Medications  Medication Dose Route Frequency Provider Last Rate Last Dose  . acetaminophen (TYLENOL) tablet 650 mg  650 mg Oral Q6H PRN Swinteck, Aaron Edelman, MD       Or  . acetaminophen (TYLENOL) suppository 650 mg  650 mg Rectal Q6H PRN Swinteck, Aaron Edelman, MD      . ALPRAZolam Duanne Moron) tablet 0.5 mg  0.5 mg Oral Daily Isaac Bliss, Rayford Halsted, MD      . aspirin EC tablet 81 mg  81 mg Oral Daily Isaac Bliss, Rayford Halsted, MD   81 mg at 12/01/16 0920  . azithromycin (ZITHROMAX) 500 mg in dextrose 5 % 250 mL IVPB  500 mg Intravenous Q24H Regalado, Belkys A, MD      . benztropine (COGENTIN) tablet 1 mg  1 mg Oral QHS Isaac Bliss, Rayford Halsted, MD   1 mg at 11/30/16 2254  . cefTRIAXone (ROCEPHIN) 1 g in dextrose 5 % 50 mL IVPB  1 g Intravenous Q24H Regalado, Belkys A, MD      . citalopram (CELEXA) tablet 40 mg  40 mg Oral Daily Isaac Bliss, Rayford Halsted, MD   40 mg at 12/01/16 0920  .  enoxaparin (LOVENOX) injection 40 mg  40 mg Subcutaneous Q24H Rod Can, MD   40 mg at 12/01/16 2878  . HYDROcodone-acetaminophen (NORCO/VICODIN) 5-325 MG per tablet 1-2 tablet  1-2 tablet Oral Q6H PRN Rod Can, MD   2 tablet at 12/01/16 1606  . insulin aspart (novoLOG) injection 0-5 Units  0-5 Units Subcutaneous QHS Isaac Bliss, Rayford Halsted, MD      . insulin aspart (novoLOG) injection 0-9 Units  0-9 Units Subcutaneous TID WC Isaac Bliss, Rayford Halsted, MD   2 Units at 12/01/16 1318  . insulin detemir (LEVEMIR) injection 24 Units  24 Units Subcutaneous QHS Isaac Bliss, Rayford Halsted, MD   24 Units at 11/30/16 2254  . lactated ringers infusion   Intravenous Continuous Annye Asa, MD 10 mL/hr at 11/30/16 1112    . menthol-cetylpyridinium (CEPACOL)  lozenge 3 mg  1 lozenge Oral PRN Swinteck, Aaron Edelman, MD       Or  . phenol (CHLORASEPTIC) mouth spray 1 spray  1 spray Mouth/Throat PRN Swinteck, Aaron Edelman, MD      . metoCLOPramide (REGLAN) tablet 5-10 mg  5-10 mg Oral Q8H PRN Swinteck, Aaron Edelman, MD       Or  . metoCLOPramide (REGLAN) injection 5-10 mg  5-10 mg Intravenous Q8H PRN Swinteck, Aaron Edelman, MD      . morphine 4 MG/ML injection 1 mg  1 mg Intravenous Q4H PRN Triadhosp, Apadmits, MD   1 mg at 11/30/16 2023  . ondansetron (ZOFRAN) tablet 4 mg  4 mg Oral Q6H PRN Swinteck, Aaron Edelman, MD       Or  . ondansetron [facility]) injection 4 mg  4 mg Intravenous Q6H PRN Rod Can, MD   4 mg at 11/30/16 2119  . pneumococcal 23 valent vaccine (PNU-IMMUNE) injection 0.5 mL  0.5 mL Intramuscular Tomorrow-1000 Gevena Barre K, MD      . pravastatin (PRAVACHOL) tablet 20 mg  20 mg Oral q1800 Isaac Bliss, Rayford Halsted, MD   20 mg at 11/30/16 1745  . risperiDONE (RISPERDAL) tablet 2 mg  2 mg Oral QHS Isaac Bliss, Rayford Halsted, MD   2 mg at 11/30/16 2254  . senna-docusate (Senokot-S) tablet 1 tablet  1 tablet Oral QHS PRN Isaac Bliss, Rayford Halsted, MD   1 tablet at 11/30/16 1745  . sodium chloride flush (NS) 0.9 % injection 3 mL  3 mL Intravenous Q12H Isaac Bliss, Rayford Halsted, MD   3 mL at 12/01/16 1000  . sodium chloride flush (NS) 0.9 % injection 3 mL  3 mL Intravenous PRN Isaac Bliss, Rayford Halsted, MD         Discharge Medications: Please see discharge summary for a list of discharge medications.  Relevant Imaging Results:  Relevant Lab Results:   Additional Information Ss#:[phone]  [place], LCSW

## 2016-12-01 NOTE — Evaluation (Signed)
Occupational Therapy Evaluation Patient Details Name: [name] MRN: [id number] DOB: 07-Apr-1950 Today's Date: 12/01/2016    History of Present Illness [name] is a 67 y/o female admitted after fall at home with the diagnosis of L distal femur fracture.  She is now s/p ORIF.  PMH positive for PMH of diabetes, cancer, HTN, bipolar d/o and knee arthritis.     Clinical Impression   PTA, pt was living with her granddaughter and performing ADLs and simple meals. Pt currently requiring Max A for LB ADLs, Min A for UB ADLs, and Mod A +2  for stand pivot transfer. Pt adhering to WB status throughout session. Pt motivated to return to PLOF and demonstrates good activity tolerance to participate in rehab. Pt will require further acute OT to facilitate safe dc. Recommend dc to CIR to optimize safety and independence with ADLs ans functional mobility as well as decreased caregiver burden.     Follow Up Recommendations  CIR    Equipment Recommendations  Other (comment) (TBD)    Recommendations for Other Services PT consult     Precautions / Restrictions Precautions Precautions: Fall Restrictions Weight Bearing Restrictions: Yes LLE Weight Bearing: Non weight bearing      Mobility Bed Mobility Overal bed mobility: Needs Assistance Bed Mobility: Supine to Sit     Supine to sit: Min assist;+2 for safety/equipment;HOB elevated     General bed mobility comments: needed help for L LE and cues to use bed rail to pull up   Transfers Overall transfer level: Needs assistance   Transfers: Squat Pivot Transfers;Stand Pivot Transfers   Stand pivot transfers: Mod assist;+2 safety/equipment;+2 physical assistance Squat pivot transfers: Mod assist;+2 safety/equipment     General transfer comment: assist to [facility] with pivot to L cues for hand placement and NWB on L, assist for safety, balance, technique; stand from [facility] cues for hand placement assist to stand and balance using R foot  to scoot around to chair on L side +2 needed due to imbalance and walker getting too far away.     Balance Overall balance assessment: Needs assistance   Sitting balance-Leahy Scale: Good     Standing balance support: Bilateral upper extremity supported Standing balance-Leahy Scale: Poor Standing balance comment: UE support needed due to NWB                           ADL either performed or assessed with clinical judgement   ADL Overall ADL's : Needs assistance/impaired Eating/Feeding: Set up;Sitting   Grooming: Set up;Wash/dry face;Brushing hair;Sitting   Upper Body Bathing: Minimal assistance;Sitting Upper Body Bathing Details (indicate cue type and reason): Min A to wash back while seated on BSC Lower Body Bathing: Maximal assistance;Sit to/from stand;+2 for safety/equipment   Upper Body Dressing : Sitting;Minimal assistance Upper Body Dressing Details (indicate cue type and reason): donned new gown with Min A Lower Body Dressing: Maximal assistance;Sit to/from stand Lower Body Dressing Details (indicate cue type and reason): Max A to don socks. Pt willing to try but unabel to reach Toilet Transfer: Moderate assistance;Stand-pivot;RW;+2 for physical assistance;BSC (To L)   Toileting- Clothing Manipulation and Hygiene: Set up;Cueing for safety;Cueing for sequencing;Min guard;Sitting/lateral lean Toileting - Clothing Manipulation Details (indicate cue type and reason): Peri care while leaning on BSC     Functional mobility during ADLs: Moderate assistance;+2 for physical assistance;Rolling walker (stand pivot onlly) General ADL Comments: Pt dmeonstrating decreased fucntional performance. Pt requiring Min A +  2 for sit<>Stand and Mod A for stand pivot. Pt requiring Max A for LB ADLs. Demosntrating good rehab potentional and motivated to particiapte in therapy and able to maintain NWB status     Vision Baseline Vision/History: Wears glasses Wears Glasses: At all  times Patient Visual Report: No change from baseline       Perception     Praxis      Pertinent Vitals/Pain Pain Assessment: 0-10 Pain Score: 2  Pain Location: L leg at rest, increased with mobility Pain Descriptors / Indicators: Constant;Discomfort Pain Intervention(s): Monitored during session;Repositioned     Hand Dominance Right   Extremity/Trunk Assessment Upper Extremity Assessment Upper Extremity Assessment: Overall WFL for tasks assessed   Lower Extremity Assessment Lower Extremity Assessment: Defer to PT evaluation LLE Deficits / Details: unable to fully extend the knee, flexion AAROM about 50, strength at least 3/5   Cervical / Trunk Assessment Cervical / Trunk Assessment: Other exceptions Cervical / Trunk Exceptions: Increased body habitus   Communication Communication Communication: No difficulties;HOH   Cognition Arousal/Alertness: Awake/alert Behavior During Therapy: WFL for tasks assessed/performed Overall Cognitive Status: Within Functional Limits for tasks assessed                                     General Comments  was unable to void on [facility], RN made aware    Exercises    Shoulder Instructions      Home Living Family/patient expects to be discharged to:: Inpatient rehab Living Arrangements: Children (grandaughter) Available Help at Discharge: Family;Available 24 hours/day Type of Home: House Home Access: Stairs to enter;Ramped entrance Entrance Stairs-Number of Steps: 5   Home Layout: One level     Bathroom Shower/Tub: Teacher, early years/pre: Handicapped height     Home Equipment: Shower seat          Prior Functioning/Environment Level of Independence: Independent        Comments: ADLS, drives, fixes microwave meals,         OT Problem List: Decreased strength;Decreased range of motion;Decreased activity tolerance;Impaired balance (sitting and/or standing);Decreased knowledge of use of DME or  AE;Decreased knowledge of precautions;Pain      OT Treatment/Interventions: Self-care/ADL training;Therapeutic exercise;Energy conservation;DME and/or AE instruction;Therapeutic activities;Patient/family education    OT Goals(Current goals can be found in the care plan section) Acute Rehab OT Goals Patient Stated Goal: To go to rehab then home OT Goal Formulation: With patient Time For Goal Achievement: 12/15/16 Potential to Achieve Goals: Good  OT Frequency: Min 2X/week   Barriers to D/C:            Co-evaluation PT/OT/SLP Co-Evaluation/Treatment: Yes Reason for Co-Treatment: For patient/therapist safety PT goals addressed during session: Mobility/safety with mobility;Strengthening/ROM OT goals addressed during session: ADL's and self-care      AM-PAC PT "6 Clicks" Daily Activity     Outcome Measure Help from another person eating meals?: None Help from another person taking care of personal grooming?: A Little Help from another person toileting, which includes using toliet, bedpan, or urinal?: A Lot Help from another person bathing (including washing, rinsing, drying)?: A Lot Help from another person to put on and taking off regular upper body clothing?: A Little Help from another person to put on and taking off regular lower body clothing?: A Lot 6 Click Score: 16   End of Session Equipment Utilized During Treatment: Gait belt;Rolling walker Nurse Communication:  Mobility status  Activity Tolerance: Patient tolerated treatment well Patient left: in chair;with call bell/phone within reach;with chair alarm set  OT Visit Diagnosis: Unsteadiness on feet (R26.81);Other abnormalities of gait and mobility (R26.89);Muscle weakness (generalized) (M62.81);Pain Pain - Right/Left: Left Pain - part of body: Leg                Time: 0761-5183 OT Time Calculation (min): 26 min Charges:  OT General Charges $OT Visit: 1 Visit OT Evaluation $OT Eval Moderate Complexity: 1  Mod G-Codes:     [place] MSOT, OTR/L Acute Rehab Pager: [phone] Office: [place] 12/01/2016, 1:30 PM

## 2016-12-01 NOTE — Progress Notes (Signed)
PROGRESS NOTE    [name]  ZTI:[id number] DOB: 05-Feb-1951 DOA: 11/29/2016 PCP: Lucia Gaskins, MD    Brief Narrative: 66 year old female with past medical history of bipolar disorder, diabetes, hypertension and morbid obesity sustained a mechanical fall on 10/8 hitting the back of her leg against her toilet. She was unable to stand and she was brought into the emergency room at [facility]. There she was found to have a nondisplaced fracture of the distal femur. Orthopedics on-call requested patient be transferred to [facility] for operative repair.     Assessment & Plan:   Principal Problem:   Other fracture of left femur, initial encounter for closed fracture [facility]) Active Problems:   Morbid obesity ([place])   Hypertension   Bipolar 1 disorder ([place])   Diabetes mellitus without complication ([place])   Closed fracture of left distal femur ([place])   1-Closed distal Fracture of left Femur;   2-Acute hypoxic Respiratory failure. Patient report she was requiring [facility].  On 3 L oxygen currently.  Check chest x ray; consistent with PNA. Start Ceftriaxone and Zithromax.  incentive spirometry.  3-Essential HTN;   4-Bipolar; Continue Risperdal, Celexa and Cogentin.  5-Diabetes mellitus on long-term insulin, type II, SSI.  Levemir.   Hyperlipidemia: Continue statin  Leukocytosis; started coverage for PNA.   DVT prophylaxis: Lovenox.  Code Status: Full code.  Family Communication: care discussed with patient.  Disposition Plan: home when stable.    Consultants:   ortho   Procedures:    Antimicrobials: Ceftriaxone 10-10 Zithromax 10-10  Subjective:   Objective: Vitals:   11/30/16 2128 12/01/16 0000 12/01/16 0430 12/01/16 1207  BP: 113/62 (!) 142/85 (!) 149/82 (!) 133/59  Pulse: 94 98 91 96  Resp: 19 20 18 17   Temp: 98.6 F (37 C) 98.4 F (36.9 C) 98.2 F (36.8 C) 98.9 F (37.2 C)  TempSrc: Oral Oral Oral Oral   SpO2: 98% 97% 98% 95%  Weight:      Height:        Intake/Output Summary (Last 24 hours) at 12/01/16 1512 Last data filed at 12/01/16 0900  Gross per 24 hour  Intake              990 ml  Output             1850 ml  Net             -860 ml   Filed Weights   11/29/16 8250  Weight: 127 kg (280 lb)    Examination:  General exam: Appears calm and comfortable  Respiratory system: Clear to auscultation. Respiratory effort normal. Cardiovascular system: S1 & S2 heard, RRR. No JVD, murmurs, rubs, gallops or clicks. No pedal edema. Gastrointestinal system: Abdomen is nondistended, soft and nontender. No organomegaly or masses felt. Normal bowel sounds heard. Central nervous system: Alert and oriented. No focal neurological deficits. Extremities: no edema. Left  LE with clean dressing  Skin: No rashes, lesions or ulcers Psychiatry: Judgement and insight appear normal. Mood & affect appropriate.     Data Reviewed: I have personally reviewed following labs and imaging studies  CBC:  Recent Labs Lab 11/29/16 0945 11/29/16 1905 11/30/16 0531 11/30/16 1638 12/01/16 0535  WBC 13.7* 12.1* 11.2* 15.0* 15.2*  NEUTROABS 10.8*  --   --   --   --   HGB 13.9 13.4 12.8 13.2 12.3  HCT 42.0 39.9 37.7 40.2 36.7  MCV 86.6 84.9 85.7 86.8 86.6  PLT 283 260 247 240 601   Basic Metabolic Panel:  Recent Labs Lab 11/29/16 0945 11/29/16 1905 11/30/16 0531 11/30/16 1638 12/01/16 0535 12/01/16 0800  NA 138  --  139  --  136 139  K 3.7  --  3.7  --  5.3* 4.2  CL 103  --  103  --  99* 99*  CO2 25  --  27  --  26 28  GLUCOSE 135*  --  114*  --  152* 156*  BUN 12  --  11  --  10 10  CREATININE 0.93 0.89 0.91 0.94 0.85 1.02*  CALCIUM 9.2  --  8.8*  --  8.5* 8.9   GFR: Estimated Creatinine Clearance: 72.3 mL/min (A) (by C-G formula based on SCr of 1.02 mg/dL (H)). Liver Function Tests:  Recent Labs Lab 11/29/16 0945  AST 24  ALT 23  ALKPHOS 85  BILITOT 0.6  PROT 7.3  ALBUMIN 3.9     No results for input(s): LIPASE, AMYLASE in the last 168 hours. No results for input(s): AMMONIA in the last 168 hours. Coagulation Profile: No results for input(s): INR, PROTIME in the last 168 hours. Cardiac Enzymes: No results for input(s): CKTOTAL, CKMB, CKMBINDEX, TROPONINI in the last 168 hours. BNP (last 3 results) No results for input(s): PROBNP in the last 8760 hours. HbA1C:  Recent Labs  11/29/16 1905  HGBA1C 6.2*   CBG:  Recent Labs Lab 11/30/16 1440 11/30/16 1652 12/01/16 0021 12/01/16 0645 12/01/16 1206  GLUCAP 162* 185* 182* 151* 181*   Lipid Profile: No results for input(s): CHOL, HDL, LDLCALC, TRIG, CHOLHDL, LDLDIRECT in the last 72 hours. Thyroid Function Tests: No results for input(s): TSH, T4TOTAL, FREET4, T3FREE, THYROIDAB in the last 72 hours. Anemia Panel: No results for input(s): VITAMINB12, FOLATE, FERRITIN, TIBC, IRON, RETICCTPCT in the last 72 hours. Sepsis Labs: No results for input(s): PROCALCITON, LATICACIDVEN in the last 168 hours.  Recent Results (from the past 240 hour(s))  Surgical pcr screen     Status: None   Collection Time: 11/29/16 10:19 PM  Result Value Ref Range Status   MRSA, PCR NEGATIVE NEGATIVE Final   Staphylococcus aureus NEGATIVE NEGATIVE Final    Comment: (NOTE) The Xpert SA Assay (FDA approved for NASAL specimens in patients 65 years of age and older), is one component of a comprehensive surveillance program. It is not intended to diagnose infection nor to guide or monitor treatment.          Radiology Studies: Dg Chest Port 1 View  Result Date: 12/01/2016 CLINICAL DATA:  Shortness of breath with onset of hypoxia today. Recent leg injury during a fall. History of diabetes, obesity, former smoker. EXAM: PORTABLE CHEST 1 VIEW COMPARISON:  Chest x-ray of November 29, 2016 FINDINGS: The lungs are adequately inflated. The interstitial markings are coarse at both bases. The cardiac silhouette is top-normal in size.  The pulmonary vascularity is normal. The mediastinum is subjectively mildly widened. There is no pleural effusion. The observed bony thorax is unremarkable. IMPRESSION: Bibasilar interstitial densities new since the chest x-ray of February 2012 and likely new since the study of 29 November 2016. This may reflect atelectasis or developing pneumonia. A follow-up PA and lateral chest x-ray would be useful. Top-normal cardiac size without pulmonary vascular congestion or pulmonary edema. Mild mediastinal prominence which is of uncertain significance. Chest CT scanning would be a useful next imaging step to further evaluate the mediastinum. Electronically Signed   By: David  [place]ique  M.D.   On: 12/01/2016 12:45   Dg C-arm 1-60 Min  Result Date: 11/30/2016 CLINICAL DATA:  Open reduction and internal fixation of the left distal femoral fracture. Reported fluoro time is 1 minutes, 42 seconds EXAM: LEFT FEMUR 2 VIEWS; DG C-ARM 61-120 MIN COMPARISON:  None. FINDINGS: The patient has undergone plate and screw fixation of an oblique vertically-oriented distal femoral fracture. Alignment of the fracture fragments is anatomic. The side plate and cortical screws appear to be in appropriate position. IMPRESSION: No postprocedure complication following ORIF for a distal left femoral metadiaphyseal fracture. Electronically Signed   By: David  [place] M.D.   On: 11/30/2016 14:29   Dg Femur Min 2 Views Left  Result Date: 11/30/2016 CLINICAL DATA:  Open reduction and internal fixation of the left distal femoral fracture. Reported fluoro time is 1 minutes, 42 seconds EXAM: LEFT FEMUR 2 VIEWS; DG C-ARM 61-120 MIN COMPARISON:  None. FINDINGS: The patient has undergone plate and screw fixation of an oblique vertically-oriented distal femoral fracture. Alignment of the fracture fragments is anatomic. The side plate and cortical screws appear to be in appropriate position. IMPRESSION: No postprocedure complication following ORIF for a  distal left femoral metadiaphyseal fracture. Electronically Signed   By: David  [place] M.D.   On: 11/30/2016 14:29   Dg Femur Port Min 2 Views Left  Result Date: 11/30/2016 CLINICAL DATA:  Status post surgical internal fixation of left femoral fracture. EXAM: LEFT FEMUR PORTABLE 2 VIEWS COMPARISON:  Fluoroscopic images of same day. FINDINGS: Status post surgical internal fixation of distal left femoral shaft fracture. Good alignment of fracture components is noted. Expected postoperative findings are noted in the soft tissues. IMPRESSION: Good alignment of distal left femoral fracture components status post surgical internal fixation. Electronically Signed   By: Marijo Conception, M.D.   On: 11/30/2016 15:51        Scheduled Meds: . ALPRAZolam  0.5 mg Oral Daily  . aspirin EC  81 mg Oral Daily  . benztropine  1 mg Oral QHS  . citalopram  40 mg Oral Daily  . enoxaparin (LOVENOX) injection  40 mg Subcutaneous Q24H  . insulin aspart  0-5 Units Subcutaneous QHS  . insulin aspart  0-9 Units Subcutaneous TID WC  . insulin detemir  24 Units Subcutaneous QHS  . pneumococcal 23 valent vaccine  0.5 mL Intramuscular Tomorrow-1000  . pravastatin  20 mg Oral q1800  . risperiDONE  2 mg Oral QHS  . sodium chloride flush  3 mL Intravenous Q12H   Continuous Infusions: . azithromycin    . cefTRIAXone (ROCEPHIN)  IV    . lactated ringers 10 mL/hr at 11/30/16 1112     LOS: 2 days    Time spent: 35 minutes.     Elmarie Shiley, MD [facility] Pager [phone]  If 7PM-7AM, please contact night-coverage [email or web address] Password [email or web address] 12/01/2016, 3:12 PM

## 2016-12-01 NOTE — NC FL2 (Signed)
[place] LEVEL OF CARE SCREENING TOOL     IDENTIFICATION  Patient Name: [name] Birthdate: 06-19-1950 Sex: female Admission Date (Current Location): 11/29/2016  [facility] and [place] Number:  Herbalist and Address:  [facility], [place] [address] [place], [place] [address]      Provider Number: [id number]  Attending Physician Name and Address:  Elmarie Shiley, MD  Relative Name and Phone Number:  Mother Ezzard Flax, [phone]    Current Level of Care: Hospital Recommended Level of Care: [place] Prior Approval Number:    Date Approved/Denied: 12/01/16 PASRR Number: [id number]  Discharge Plan: SNF    Current Diagnoses: Patient Active Problem List   Diagnosis Date Noted  . Hypertension 11/30/2016  . Bipolar 1 disorder ([place]) 11/30/2016  . Diabetes mellitus without complication ([place]) 10/93/2355  . Closed fracture of left distal femur ([place]) 11/30/2016  . Other fracture of left femur, initial encounter for closed fracture ([place]) 11/29/2016  . Morbid obesity ([place]) 11/29/2016    Orientation RESPIRATION BLADDER Height & Weight     Self, Time, Situation, Place  O2 (Nasal Cannula) Continent Weight: 280 lb (127 kg) Height:  5\' 3"  (160 cm)  BEHAVIORAL SYMPTOMS/MOOD NEUROLOGICAL BOWEL NUTRITION STATUS      Continent Diet (See DC Summary)  AMBULATORY STATUS COMMUNICATION OF NEEDS Skin   Extensive Assist Verbally Surgical wounds (Left Leg, Compression Wrap)                       Personal Care Assistance Level of Assistance  Dressing, Bathing Bathing Assistance: Maximum assistance   Dressing Assistance: Maximum assistance     Functional Limitations Info  Hearing (hearing impaired, no hearing aids)          SPECIAL CARE FACTORS FREQUENCY  OT (By licensed OT), PT (By licensed PT)     PT Frequency: 3x week OT Frequency: 2x week            Contractures      Additional Factors  Info  Code Status, Allergies, Psychotropic, Insulin Sliding Scale Code Status Info: Full code Allergies Info: LATEX, SULFA ANTIBIOTICS  Psychotropic Info: Risperidone Insulin Sliding Scale Info: Insulin daily       Current Medications (12/01/2016):  This is the current hospital active medication list Current Facility-Administered Medications  Medication Dose Route Frequency Provider Last Rate Last Dose  . acetaminophen (TYLENOL) tablet 650 mg  650 mg Oral Q6H PRN Swinteck, Aaron [place], MD       Or  . acetaminophen (TYLENOL) suppository 650 mg  650 mg Rectal Q6H PRN Swinteck, Aaron [place], MD      . ALPRAZolam Duanne Moron) tablet 0.5 mg  0.5 mg Oral Daily Isaac Bliss, Rayford Halsted, MD      . aspirin EC tablet 81 mg  81 mg Oral Daily Isaac Bliss, Rayford Halsted, MD   81 mg at 12/01/16 0920  . azithromycin (ZITHROMAX) 500 mg in dextrose 5 % 250 mL IVPB  500 mg Intravenous Q24H Regalado, Belkys A, MD      . benztropine (COGENTIN) tablet 1 mg  1 mg Oral QHS Isaac Bliss, Rayford Halsted, MD   1 mg at 11/30/16 2254  . cefTRIAXone (ROCEPHIN) 1 g in dextrose 5 % 50 mL IVPB  1 g Intravenous Q24H Regalado, Belkys A, MD      . citalopram (CELEXA) tablet 40 mg  40 mg Oral Daily Isaac Bliss, Rayford Halsted, MD   40  mg at 12/01/16 0920  . enoxaparin (LOVENOX) injection 40 mg  40 mg Subcutaneous Q24H Rod Can, MD   40 mg at 12/01/16 6237  . HYDROcodone-acetaminophen (NORCO/VICODIN) 5-325 MG per tablet 1-2 tablet  1-2 tablet Oral Q6H PRN Rod Can, MD   2 tablet at 12/01/16 1606  . insulin aspart (novoLOG) injection 0-5 Units  0-5 Units Subcutaneous QHS Isaac Bliss, Rayford Halsted, MD      . insulin aspart (novoLOG) injection 0-9 Units  0-9 Units Subcutaneous TID WC Isaac Bliss, Rayford Halsted, MD   2 Units at 12/01/16 1318  . insulin detemir (LEVEMIR) injection 24 Units  24 Units Subcutaneous QHS Isaac Bliss, Rayford Halsted, MD   24 Units at 11/30/16 2254  . lactated ringers infusion   Intravenous Continuous Annye Asa, MD 10 mL/hr at 11/30/16 1112    . menthol-cetylpyridinium (CEPACOL) lozenge 3 mg  1 lozenge Oral PRN Swinteck, Aaron [place], MD       Or  . phenol (CHLORASEPTIC) mouth spray 1 spray  1 spray Mouth/Throat PRN Swinteck, Aaron Edelman, MD      . metoCLOPramide (REGLAN) tablet 5-10 mg  5-10 mg Oral Q8H PRN Swinteck, Aaron Edelman, MD       Or  . metoCLOPramide (REGLAN) injection 5-10 mg  5-10 mg Intravenous Q8H PRN Swinteck, Aaron Edelman, MD      . morphine 4 MG/ML injection 1 mg  1 mg Intravenous Q4H PRN Triadhosp, Apadmits, MD   1 mg at 11/30/16 2023  . ondansetron (ZOFRAN) tablet 4 mg  4 mg Oral Q6H PRN Swinteck, Aaron Edelman, MD       Or  . ondansetron [facility]) injection 4 mg  4 mg Intravenous Q6H PRN Rod Can, MD   4 mg at 11/30/16 2119  . pneumococcal 23 valent vaccine (PNU-IMMUNE) injection 0.5 mL  0.5 mL Intramuscular Tomorrow-1000 Gevena Barre K, MD      . pravastatin (PRAVACHOL) tablet 20 mg  20 mg Oral q1800 Isaac Bliss, Rayford Halsted, MD   20 mg at 11/30/16 1745  . risperiDONE (RISPERDAL) tablet 2 mg  2 mg Oral QHS Isaac Bliss, Rayford Halsted, MD   2 mg at 11/30/16 2254  . senna-docusate (Senokot-S) tablet 1 tablet  1 tablet Oral QHS PRN Isaac Bliss, Rayford Halsted, MD   1 tablet at 11/30/16 1745  . sodium chloride flush (NS) 0.9 % injection 3 mL  3 mL Intravenous Q12H Isaac Bliss, Rayford Halsted, MD   3 mL at 12/01/16 1000  . sodium chloride flush (NS) 0.9 % injection 3 mL  3 mL Intravenous PRN Isaac Bliss, Rayford Halsted, MD         Discharge Medications: Please see discharge summary for a list of discharge medications.  Relevant Imaging Results:  Relevant Lab Results:   Additional Information Ss#:[phone]  [place], LCSW

## 2016-12-01 NOTE — Care Management (Signed)
66 yr old female s/p Left femur fracture, has had ORIF of Left femur. CIR to assess patient for possible admission. If not appropriate or no beds available patient will need shortterm SNF, social worker is aware.

## 2016-12-01 NOTE — Clinical Social Work Note (Signed)
Clinical Social Work Assessment  Patient Details  Name: [name] MRN: [id number] Date of Birth: [birth year]  Date of referral:                  Reason for consult:  Facility Placement                Permission sought to share information with:  Facility Art therapist granted to share information::  Yes, Verbal Permission Granted  Name::     [name]  Agency::  [facility]-[place] [place]  Relationship::  mother  Contact Information:     Housing/Transportation Living arrangements for the past 2 months:  Single Family Home Source of Information:  Patient Patient Interpreter Needed:  None Criminal Activity/Legal Involvement Pertinent to Current Situation/Hospitalization:  No - Comment as needed Significant Relationships:  Parents, Other Family Members Lives with:  Self, Other (Comment) (granddaughter) Do you feel safe going back to the place where you live?  No Need for family participation in patient care:  No (Coment)  Care giving concerns:  Pt resides at home with 64 year old granddaughter.  Pt resides in a single family, one floor home. Pt indicates that prior to hospitalization she was independent with ADL's. Given new impairment and limitations with ambulation, patient will need more assistance at home than granddaughter can provide.   Social Worker assessment / plan:  CSW met with patient to discuss clinical team's recommendation for short term rehab. CSW explained the SNF process/options. CSW discussed her role in this process. CSW obtained permission to send out offers to SNF's in [place] and [place] to ensure that patient has a bed. CSW discussed with patient the insurance authorization process. CSW indicated that patient will need to pick one SNF when we have offers and that SNF will need to initiate insurance auth. Patient voiced understanding with this Insurance process.   Pt declined for CSW to contact other family members at this time.  CSW will  continue to follow for disposition.   Employment status:  Disabled (Comment on whether or not currently receiving Disability) Insurance information:  Managed Medicare PT Recommendations:  [place] / Referral to community resources:  [place]  Patient/Family's Response to care:  Patient appreciative of CSW assistance with the SNF process. Pt reports no issues with care.  Patient/Family's Understanding of and Emotional Response to Diagnosis, Current Treatment, and Prognosis:  Patient has good understanding of diagnosis, current treatment and prognosis. Pt amenable to SNF placement and hopeful that she will get home immediately back to her independence. No other issues or concerns identified.  Emotional Assessment Appearance:  Appears older than stated age Attitude/Demeanor/Rapport:    Affect (typically observed):   (Cooperative) Orientation:  Oriented to Self, Oriented to Place, Oriented to  Time, Oriented to Situation Alcohol / Substance use:  Not Applicable Psych involvement (Current and /or in the community):  No (Comment)  Discharge Needs  Concerns to be addressed:  Care Coordination Readmission within the last 30 days:  No Current discharge risk:  Dependent with Mobility, Physical Impairment Barriers to Discharge:  No Barriers Identified   Normajean Baxter, LCSW 12/01/2016, 4:27 PM

## 2016-12-01 NOTE — Progress Notes (Signed)
   Subjective:  Patient reports pain as mild to moderate.  No c/o.  Objective:   VITALS:   Vitals:   11/30/16 1629 11/30/16 2128 12/01/16 0000 12/01/16 0430  BP: (!) 168/55 113/62 (!) 142/85 (!) 149/82  Pulse: 90 94 98 91  Resp: 19 19 20 18   Temp: 97.9 F (36.6 C) 98.6 F (37 C) 98.4 F (36.9 C) 98.2 F (36.8 C)  TempSrc: Oral Oral Oral Oral  SpO2: 96% 98% 97% 98%  Weight:      Height:        NAD ABD soft Sensation intact distally Intact pulses distally Dorsiflexion/Plantar flexion intact Incision: dressing C/D/I Compartment soft   Lab Results  Component Value Date   WBC PENDING 12/01/2016   HGB 12.3 12/01/2016   HCT 36.7 12/01/2016   MCV 86.6 12/01/2016   PLT 260 12/01/2016   BMET    Component Value Date/Time   NA 136 12/01/2016 0535   K 5.3 (H) 12/01/2016 0535   CL 99 (L) 12/01/2016 0535   CO2 26 12/01/2016 0535   GLUCOSE 152 (H) 12/01/2016 0535   BUN 10 12/01/2016 0535   CREATININE 0.85 12/01/2016 0535   CALCIUM 8.5 (L) 12/01/2016 0535   GFRNONAA >60 12/01/2016 0535   GFRAA >60 12/01/2016 0535     Assessment/Plan: 1 Day Post-Op   Principal Problem:   Other fracture of left femur, initial encounter for closed fracture (HCC) Active Problems:   Morbid obesity ([place])   Hypertension   Bipolar 1 disorder ([place])   Diabetes mellitus without complication ([place])   Closed fracture of left distal femur (HCC)   NWB LLE, unlimited ROM knee DVT ppx: lovenox, SCDs, TEDs PO pain control PT/OT Dispo: SNF placement   Samwise Eckardt, Horald Pollen 12/01/2016, 7:39 AM   Rod Can, MD Cell [phone]

## 2016-12-01 NOTE — Evaluation (Signed)
Physical Therapy Evaluation Patient Details Name: [name] MRN: [id number] DOB: 04/20/1950 Today's Date: 12/01/2016   History of Present Illness  [name] is a 66 y/o female admitted after fall at home with the diagnosis of distal femur fracture.  She is now s/p ORIF.  PMH positive for PMH of diabetes, cancer, HTN, bipolar d/o and knee arthritis.    Clinical Impression  Patient presents with decreased independence with mobility due to pain and limited weight bearing L LE.  She currently needs +2 mod to min A for safety with transfers and to maintain NWB and was previously ambulating independent and participating in some IADL's.  Feel she will benefit from CIR level rehab prior to d/c home with grandaughter assist.  PT to follow acutely.    Follow Up Recommendations CIR    Equipment Recommendations  Rolling walker with 5" wheels    Recommendations for Other Services Rehab consult     Precautions / Restrictions Precautions Precautions: Fall Restrictions Weight Bearing Restrictions: Yes      Mobility  Bed Mobility Overal bed mobility: Needs Assistance Bed Mobility: Supine to Sit     Supine to sit: Min assist;+2 for safety/equipment;HOB elevated     General bed mobility comments: needed help for L LE and cues to use bed rail to pull up   Transfers Overall transfer level: Needs assistance   Transfers: Squat Pivot Transfers;Stand Pivot Transfers   Stand pivot transfers: Mod assist;+2 safety/equipment;+2 physical assistance Squat pivot transfers: Mod assist;+2 safety/equipment     General transfer comment: assist to [facility] with pivot to L cues for hand placement and NWB on L, assist for safety, balance, technique; stand from [facility] cues for hand placement assist to stand and balance using R foot to scoot around to chair on L side +2 needed due to imbalance and walker getting too far away.   Ambulation/Gait                Stairs            Wheelchair  Mobility    Modified Rankin (Stroke Patients Only)       Balance Overall balance assessment: Needs assistance   Sitting balance-Leahy Scale: Good     Standing balance support: Bilateral upper extremity supported Standing balance-Leahy Scale: Poor Standing balance comment: UE support needed due to NWB                             Pertinent Vitals/Pain Pain Assessment: 0-10 Pain Score: 2  Pain Location: L leg at rest, increased with mobility Pain Descriptors / Indicators: Constant;Discomfort Pain Intervention(s): Monitored during session;Repositioned    Home Living Family/patient expects to be discharged to:: Inpatient rehab Living Arrangements: Children (grandaughter) Available Help at Discharge: Family;Available 24 hours/day Type of Home: House Home Access: Stairs to enter;Ramped entrance   Entrance Stairs-Number of Steps: 5 Home Layout: One level        Prior Function Level of Independence: Independent         Comments: drives, fixes microwave meals,      Hand Dominance   Dominant Hand: Right    Extremity/Trunk Assessment   Upper Extremity Assessment Upper Extremity Assessment: Defer to OT evaluation    Lower Extremity Assessment Lower Extremity Assessment: LLE deficits/detail LLE Deficits / Details: unable to fully extend the knee, flexion AAROM about 50, strength at least 3/5       Communication   Communication: No  difficulties;HOH  Cognition Arousal/Alertness: Awake/alert Behavior During Therapy: WFL for tasks assessed/performed Overall Cognitive Status: Within Functional Limits for tasks assessed                                        General Comments General comments (skin integrity, edema, etc.): was unable to void on [facility], RN made aware    Exercises Total Joint Exercises Ankle Circles/Pumps: AROM;Both;10 reps;Supine Quad Sets: AROM;Left;5 reps;Supine Heel Slides: AAROM;Left;Supine;10 reps    Assessment/Plan    PT Assessment Patient needs continued PT services  PT Problem List Decreased range of motion;Decreased activity tolerance;Decreased balance;Decreased knowledge of use of DME;Pain;Cardiopulmonary status limiting activity       PT Treatment Interventions DME instruction;Gait training;Therapeutic exercise;Balance training;Functional mobility training;Therapeutic activities;Patient/family education;Wheelchair mobility training    PT Goals (Current goals can be found in the Care Plan section)  Acute Rehab PT Goals Patient Stated Goal: To go to rehab then home PT Goal Formulation: With patient Time For Goal Achievement: 12/08/16 Potential to Achieve Goals: Good    Frequency Min 3X/week   Barriers to discharge        Co-evaluation PT/OT/SLP Co-Evaluation/Treatment: Yes Reason for Co-Treatment: For patient/therapist safety;To address functional/ADL transfers PT goals addressed during session: Mobility/safety with mobility;Strengthening/ROM         AM-PAC PT "6 Clicks" Daily Activity  Outcome Measure Difficulty turning over in bed (including adjusting bedclothes, sheets and blankets)?: Unable Difficulty moving from lying on back to sitting on the side of the bed? : Unable Difficulty sitting down on and standing up from a chair with arms (e.g., wheelchair, bedside commode, etc,.)?: Unable Help needed moving to and from a bed to chair (including a wheelchair)?: A Lot Help needed walking in hospital room?: Total Help needed climbing 3-5 steps with a railing? : Total 6 Click Score: 7    End of Session Equipment Utilized During Treatment: Gait belt;Oxygen Activity Tolerance: Patient tolerated treatment well Patient left: in chair;with call bell/phone within reach;with chair alarm set Nurse Communication: Mobility status PT Visit Diagnosis: Difficulty in walking, not elsewhere classified (R26.2);Muscle weakness (generalized) (M62.81);Pain    Time: 9983-3825 PT  Time Calculation (min) (ACUTE ONLY): 26 min   Charges:   PT Evaluation $PT Eval Moderate Complexity: 1 Mod     PT G CodesMagda Kiel, [place] [phone] 12/01/2016   Reginia Naas 12/01/2016, 12:21 PM

## 2016-12-02 LAB — BASIC METABOLIC PANEL
ANION GAP: 10 (ref 5–15)
BUN: 9 mg/dL (ref 6–20)
CHLORIDE: 99 mmol/L — AB (ref 101–111)
CO2: 29 mmol/L (ref 22–32)
Calcium: 8.9 mg/dL (ref 8.9–10.3)
Creatinine, Ser: 0.92 mg/dL (ref 0.44–1.00)
GFR calc Af Amer: >60 mL/min (ref >60)
Glucose, Bld: 154 mg/dL — ABNORMAL HIGH (ref 65–99)
POTASSIUM: 4 mmol/L (ref 3.5–5.1)
SODIUM: 138 mmol/L (ref 135–145)

## 2016-12-02 LAB — CBC
HCT: 37.3 % (ref 36.0–46.0)
HEMOGLOBIN: 11.8 g/dL — AB (ref 12.0–15.0)
MCH: 27.8 pg (ref 26.0–34.0)
MCHC: 31.6 g/dL (ref 30.0–36.0)
MCV: 88 fL (ref 78.0–100.0)
PLATELETS: 255 10*3/uL (ref 150–400)
RBC: 4.24 MIL/uL (ref 3.87–5.11)
RDW: 14.8 % (ref 11.5–15.5)
WBC: 14 10*3/uL — AB (ref 4.0–10.5)

## 2016-12-02 LAB — GLUCOSE, CAPILLARY
GLUCOSE-CAPILLARY: 156 mg/dL — AB (ref 65–99)
GLUCOSE-CAPILLARY: 154 mg/dL — AB (ref 65–99)
GLUCOSE-CAPILLARY: 168 mg/dL — AB (ref 65–99)

## 2016-12-02 MED ORDER — ACETAMINOPHEN 325 MG PO TABS: 650.0000 mg | ORAL_TABLET | Freq: Four times a day (QID) | ORAL | 0 refills | Status: DC | PRN

## 2016-12-02 MED ORDER — HYDRALAZINE HCL 20 MG/ML IJ SOLN: 10.0000 mg | Freq: Three times a day (TID) | INTRAMUSCULAR | Status: DC | PRN

## 2016-12-02 NOTE — Progress Notes (Signed)
   Subjective:  Patient reports pain as mild to moderate.  No c/o.  Objective:   VITALS:   Vitals:   12/01/16 1207 12/01/16 1855 12/01/16 2034 12/02/16 0600  BP: (!) 133/59  (!) 154/67 (!) 142/78  Pulse: 96  (!) 102 98  Resp: 17  16 16   Temp: 98.9 F (37.2 C)  98.7 F (37.1 C) 98.9 F (37.2 C)  TempSrc: Oral  Oral Oral  SpO2: 95% 94% 95% 94%  Weight:      Height:        NAD ABD soft Sensation intact distally Intact pulses distally Dorsiflexion/Plantar flexion intact Incision: dressing C/D/I Compartment soft   Lab Results  Component Value Date   WBC 14.0 (H) 12/02/2016   HGB 11.8 (L) 12/02/2016   HCT 37.3 12/02/2016   MCV 88.0 12/02/2016   PLT 255 12/02/2016   BMET    Component Value Date/Time   NA 138 12/02/2016 0541   K 4.0 12/02/2016 0541   CL 99 (L) 12/02/2016 0541   CO2 29 12/02/2016 0541   GLUCOSE 154 (H) 12/02/2016 0541   BUN 9 12/02/2016 0541   CREATININE 0.92 12/02/2016 0541   CALCIUM 8.9 12/02/2016 0541   GFRNONAA >60 12/02/2016 0541   GFRAA >60 12/02/2016 0541     Assessment/Plan: 2 Days Post-Op   Principal Problem:   Other fracture of left femur, initial encounter for closed fracture [facility]) Active Problems:   Morbid obesity ([place])   Hypertension   Bipolar 1 disorder ([place])   Diabetes mellitus without complication ([place])   Closed fracture of left distal femur (HCC)   NWB LLE, unlimited ROM knee DVT ppx: lovenox for 30 days, SCDs, TEDs PO pain control PT/OT Dispo: SNF placement   [name], [name] 12/02/2016, 12:52 PM   Rod Can, MD Cell [phone]

## 2016-12-02 NOTE — Social Work (Signed)
CSW met with patient this day to discuss SNF offers.  Pt has accepted bed off from [facility] [place]. CSW advised facility so that they can begin the [employer] process. CSW hoping to have auth by tomorrow or sooner.  CSW will continue to follow.  Elissa Hefty, LCSW Clinical Social Worker [phone]

## 2016-12-02 NOTE — Progress Notes (Signed)
PROGRESS NOTE    [name]  OBS:[id number] DOB: 01/28/1951 DOA: 11/29/2016 PCP: Lucia Gaskins, MD    Brief Narrative: 66 year old female with past medical history of bipolar disorder, diabetes, hypertension and morbid obesity sustained a mechanical fall on 10/8 hitting the back of her leg against her toilet. She was unable to stand and she was brought into the emergency room at [facility]. There she was found to have a nondisplaced fracture of the distal femur. Orthopedics on-call requested patient be transferred to [facility] for operative repair.     Assessment & Plan:   Principal Problem:   Other fracture of left femur, initial encounter for closed fracture [facility]) Active Problems:   Morbid obesity ([place])   Hypertension   Bipolar 1 disorder ([place])   Diabetes mellitus without complication ([place])   Closed fracture of left distal femur ([place])   1-Closed distal Fracture of left Femur;  Per ortho.  Lovenox for prophylaxis for one month.  NWB LLE, unlimited ROM knee.   2-Acute hypoxic Respiratory failure. Related to PAN.  On 3 L oxygen currently.  chest x ray; consistent with PNA. Continue with  Ceftriaxone and Zithromax.  incentive spirometry. Respiratory status stable, WBC trending down.   3-Essential HTN;  Hold lisinopril post op.  PRN hydralazine.   4-Bipolar; Continue Risperdal, Celexa and Cogentin.  5-Diabetes mellitus on long-term insulin, type II, SSI.  Levemir.   Hyperlipidemia: Continue statin  Leukocytosis; started coverage for PNA.  WBC trending down.   DVT prophylaxis: Lovenox.  Code Status: Full code.  Family Communication: care discussed with patient.  Disposition Plan: home when stable.    Consultants:   ortho   Procedures:    Antimicrobials: Ceftriaxone 10-10 Zithromax 10-10  Subjective: She is breathing better, complaint of pain left knee, leg   Objective: Vitals:   12/01/16 1207 12/01/16 1855 12/01/16  2034 12/02/16 0600  BP: (!) 133/59  (!) 154/67 (!) 142/78  Pulse: 96  (!) 102 98  Resp: 17  16 16   Temp: 98.9 F (37.2 C)  98.7 F (37.1 C) 98.9 F (37.2 C)  TempSrc: Oral  Oral Oral  SpO2: 95% 94% 95% 94%  Weight:      Height:        Intake/Output Summary (Last 24 hours) at 12/02/16 1428 Last data filed at 12/02/16 0955  Gross per 24 hour  Intake              240 ml  Output                0 ml  Net              240 ml   Filed Weights   11/29/16 4765  Weight: 127 kg (280 lb)    Examination:  General exam: NAD Respiratory system: CTA Cardiovascular system; S 1, S 2 RRR Gastrointestinal system: Abdomen soft, nt, nd Central nervous system: non focal.  Extremities: no edema. Left LE with dressing Skin: No rashes, lesions or ulcers     Data Reviewed: I have personally reviewed following labs and imaging studies  CBC:  Recent Labs Lab 11/29/16 0945 11/29/16 1905 11/30/16 0531 11/30/16 1638 12/01/16 0535 12/02/16 0541  WBC 13.7* 12.1* 11.2* 15.0* 15.2* 14.0*  NEUTROABS 10.8*  --   --   --   --   --   HGB 13.9 13.4 12.8 13.2 12.3 11.8*  HCT 42.0 39.9 37.7 40.2 36.7 37.3  MCV 86.6 84.9 85.7 86.8  86.6 88.0  PLT 283 260 247 240 260 836   Basic Metabolic Panel:  Recent Labs Lab 11/29/16 0945  11/30/16 0531 11/30/16 1638 12/01/16 0535 12/01/16 0800 12/02/16 0541  NA 138  --  139  --  136 139 138  K 3.7  --  3.7  --  5.3* 4.2 4.0  CL 103  --  103  --  99* 99* 99*  CO2 25  --  27  --  26 28 29   GLUCOSE 135*  --  114*  --  152* 156* 154*  BUN 12  --  11  --  10 10 9   CREATININE 0.93  < > 0.91 0.94 0.85 1.02* 0.92  CALCIUM 9.2  --  8.8*  --  8.5* 8.9 8.9  < > = values in this interval not displayed. GFR: Estimated Creatinine Clearance: 80.2 mL/min (by C-G formula based on SCr of 0.92 mg/dL). Liver Function Tests:  Recent Labs Lab 11/29/16 0945  AST 24  ALT 23  ALKPHOS 85  BILITOT 0.6  PROT 7.3  ALBUMIN 3.9   No results for input(s): LIPASE,  AMYLASE in the last 168 hours. No results for input(s): AMMONIA in the last 168 hours. Coagulation Profile: No results for input(s): INR, PROTIME in the last 168 hours. Cardiac Enzymes: No results for input(s): CKTOTAL, CKMB, CKMBINDEX, TROPONINI in the last 168 hours. BNP (last 3 results) No results for input(s): PROBNP in the last 8760 hours. HbA1C:  Recent Labs  11/29/16 1905  HGBA1C 6.2*   CBG:  Recent Labs Lab 12/01/16 1206 12/01/16 1638 12/01/16 2223 12/02/16 0629 12/02/16 1140  GLUCAP 181* 157* 96 156* 154*   Lipid Profile: No results for input(s): CHOL, HDL, LDLCALC, TRIG, CHOLHDL, LDLDIRECT in the last 72 hours. Thyroid Function Tests: No results for input(s): TSH, T4TOTAL, FREET4, T3FREE, THYROIDAB in the last 72 hours. Anemia Panel: No results for input(s): VITAMINB12, FOLATE, FERRITIN, TIBC, IRON, RETICCTPCT in the last 72 hours. Sepsis Labs: No results for input(s): PROCALCITON, LATICACIDVEN in the last 168 hours.  Recent Results (from the past 240 hour(s))  Surgical pcr screen     Status: None   Collection Time: 11/29/16 10:19 PM  Result Value Ref Range Status   MRSA, PCR NEGATIVE NEGATIVE Final   Staphylococcus aureus NEGATIVE NEGATIVE Final    Comment: (NOTE) The Xpert SA Assay (FDA approved for NASAL specimens in patients 47 years of age and older), is one component of a comprehensive surveillance program. It is not intended to diagnose infection nor to guide or monitor treatment.          Radiology Studies: Dg Chest Port 1 View  Result Date: 12/01/2016 CLINICAL DATA:  Shortness of breath with onset of hypoxia today. Recent leg injury during a fall. History of diabetes, obesity, former smoker. EXAM: PORTABLE CHEST 1 VIEW COMPARISON:  Chest x-ray of November 29, 2016 FINDINGS: The lungs are adequately inflated. The interstitial markings are coarse at both bases. The cardiac silhouette is top-normal in size. The pulmonary vascularity is normal.  The mediastinum is subjectively mildly widened. There is no pleural effusion. The observed bony thorax is unremarkable. IMPRESSION: Bibasilar interstitial densities new since the chest x-ray of February 2012 and likely new since the study of 29 November 2016. This may reflect atelectasis or developing pneumonia. A follow-up PA and lateral chest x-ray would be useful. Top-normal cardiac size without pulmonary vascular congestion or pulmonary edema. Mild mediastinal prominence which is of uncertain significance. Chest CT scanning  would be a useful next imaging step to further evaluate the mediastinum. Electronically Signed   By: David  Martinique M.D.   On: 12/01/2016 12:45   Dg Femur Port Min 2 Views Left  Result Date: 11/30/2016 CLINICAL DATA:  Status post surgical internal fixation of left femoral fracture. EXAM: LEFT FEMUR PORTABLE 2 VIEWS COMPARISON:  Fluoroscopic images of same day. FINDINGS: Status post surgical internal fixation of distal left femoral shaft fracture. Good alignment of fracture components is noted. Expected postoperative findings are noted in the soft tissues. IMPRESSION: Good alignment of distal left femoral fracture components status post surgical internal fixation. Electronically Signed   By: Marijo Conception, M.D.   On: 11/30/2016 15:51        Scheduled Meds: . ALPRAZolam  0.5 mg Oral Daily  . aspirin EC  81 mg Oral Daily  . benztropine  1 mg Oral QHS  . citalopram  40 mg Oral Daily  . enoxaparin (LOVENOX) injection  40 mg Subcutaneous Q24H  . insulin aspart  0-5 Units Subcutaneous QHS  . insulin aspart  0-9 Units Subcutaneous TID WC  . insulin detemir  24 Units Subcutaneous QHS  . pravastatin  20 mg Oral q1800  . risperiDONE  2 mg Oral QHS  . sodium chloride flush  3 mL Intravenous Q12H   Continuous Infusions: . azithromycin Stopped (12/01/16 1840)  . cefTRIAXone (ROCEPHIN)  IV Stopped (12/01/16 1731)  . lactated ringers 10 mL/hr at 11/30/16 1112     LOS: 3 days     Time spent: 35 minutes.     Elmarie Shiley, MD [facility] Pager [phone]  If 7PM-7AM, please contact night-coverage [email or web address] Password [email or web address] 12/02/2016, 2:28 PM

## 2016-12-02 NOTE — Progress Notes (Signed)
Physical Therapy Treatment Patient Details Name: [name] MRN: [id number] DOB: 13-Sep-1950 Today's Date: 12/02/2016    History of Present Illness [name] is a 66 y/o female admitted after fall at home with the diagnosis of L distal femur fracture.  She is now s/p ORIF.  PMH positive for PMH of diabetes, cancer, HTN, bipolar d/o and knee arthritis.      PT Comments    Patient progressing with ability to stand with assist of one, but remains unsteady for stand pivot transfers requiring max A for safety.  Feel continued skilled PT indicated prior to d/c home.    Follow Up Recommendations  SNF;Supervision/Assistance - 24 hour     Equipment Recommendations  Rolling walker with 5" wheels    Recommendations for Other Services       Precautions / Restrictions Precautions Precautions: Fall Restrictions Weight Bearing Restrictions: Yes LLE Weight Bearing: Non weight bearing    Mobility  Bed Mobility Overal bed mobility: Needs Assistance Bed Mobility: Sit to Supine       Sit to supine: Mod assist   General bed mobility comments: assist for L LE and for positioning  Transfers Overall transfer level: Needs assistance Equipment used: Rolling walker (2 wheeled) Transfers: Sit to/from Stand   Stand pivot transfers: Max assist Squat pivot transfers: Max assist     General transfer comment: lifting and lowering assistance, SPT with RW pt sat prematurely on bed due to imbalance backwards as backing to bed, educated need to reach back to feel seating surface prior to sitting  Ambulation/Gait Ambulation/Gait assistance: Mod assist;+2 safety/equipment Ambulation Distance (Feet): 4 Feet Assistive device: Rolling walker (2 wheeled) Gait Pattern/deviations: Step-to pattern     General Gait Details: hopping forward with chair following; assist for balance/safety, able to maintain NWB   Stairs            Wheelchair Mobility    Modified Rankin (Stroke  Patients Only)       Balance Overall balance assessment: Needs assistance Sitting-balance support: No upper extremity supported Sitting balance-Leahy Scale: Good     Standing balance support: Bilateral upper extremity supported Standing balance-Leahy Scale: Poor Standing balance comment: UE support needed due to NWB                            Cognition Arousal/Alertness: Awake/alert Behavior During Therapy: WFL for tasks assessed/performed Overall Cognitive Status: Within Functional Limits for tasks assessed                                        Exercises Total Joint Exercises Ankle Circles/Pumps: AROM;Both;10 reps;Seated Quad Sets: AROM;Left;10 reps;Seated Heel Slides: AAROM;Left;10 reps;Seated (foot on floor)    General Comments        Pertinent Vitals/Pain Pain Score: 7  Pain Location: L thigh Pain Descriptors / Indicators: Constant;Discomfort Pain Intervention(s): Monitored during session;Limited activity within patient's tolerance;Repositioned    Home Living                      Prior Function            PT Goals (current goals can now be found in the care plan section) Progress towards PT goals: Progressing toward goals    Frequency    Min 3X/week      PT Plan Discharge plan needs to be  updated    Co-evaluation              AM-PAC PT "6 Clicks" Daily Activity  Outcome Measure  Difficulty turning over in bed (including adjusting bedclothes, sheets and blankets)?: A Lot Difficulty moving from lying on back to sitting on the side of the bed? : Unable Difficulty sitting down on and standing up from a chair with arms (e.g., wheelchair, bedside commode, etc,.)?: Unable Help needed moving to and from a bed to chair (including a wheelchair)?: A Lot Help needed walking in hospital room?: A Lot Help needed climbing 3-5 steps with a railing? : Total 6 Click Score: 9    End of Session Equipment Utilized During  Treatment: Gait belt;Oxygen Activity Tolerance: Patient tolerated treatment well Patient left: in bed;with call bell/phone within reach   PT Visit Diagnosis: Difficulty in walking, not elsewhere classified (R26.2);Muscle weakness (generalized) (M62.81);Pain     Time: 1435-1500 PT Time Calculation (min) (ACUTE ONLY): 25 min  Charges:  $Therapeutic Exercise: 8-22 mins $Therapeutic Activity: 8-22 mins                    G CodesMagda [name], [place] [phone] 12/02/2016    [name] 12/02/2016, 3:11 PM

## 2016-12-03 LAB — BASIC METABOLIC PANEL
Anion gap: 9 (ref 5–15)
BUN: 12 mg/dL (ref 6–20)
CO2: 29 mmol/L (ref 22–32)
CREATININE: 0.68 mg/dL (ref 0.44–1.00)
Calcium: 8.6 mg/dL — ABNORMAL LOW (ref 8.9–10.3)
Chloride: 102 mmol/L (ref 101–111)
Glucose, Bld: 126 mg/dL — ABNORMAL HIGH (ref 65–99)
POTASSIUM: 3.9 mmol/L (ref 3.5–5.1)
SODIUM: 140 mmol/L (ref 135–145)

## 2016-12-03 LAB — GLUCOSE, CAPILLARY: GLUCOSE-CAPILLARY: 111 mg/dL — AB (ref 65–99)

## 2016-12-03 LAB — CBC
HCT: 35.7 % — ABNORMAL LOW (ref 36.0–46.0)
Hemoglobin: 11.7 g/dL — ABNORMAL LOW (ref 12.0–15.0)
MCH: 28.7 pg (ref 26.0–34.0)
MCHC: 32.8 g/dL (ref 30.0–36.0)
MCV: 87.7 fL (ref 78.0–100.0)
PLATELETS: 240 10*3/uL (ref 150–400)
RBC: 4.07 MIL/uL (ref 3.87–5.11)
RDW: 14.6 % (ref 11.5–15.5)
WBC: 10.8 10*3/uL — ABNORMAL HIGH (ref 4.0–10.5)

## 2016-12-03 MED ORDER — ALPRAZOLAM 0.5 MG PO TABS: 0.5000 mg | ORAL_TABLET | Freq: Every day | ORAL | 0 refills | Status: DC

## 2016-12-03 MED ORDER — AZITHROMYCIN 500 MG PO TABS
500.0000 mg | ORAL_TABLET | Freq: Every day | ORAL | Status: DC
  Filled 2016-12-03: qty 1

## 2016-12-03 MED ORDER — AZITHROMYCIN 500 MG PO TABS: 500.0000 mg | ORAL_TABLET | Freq: Every day | ORAL | 0 refills | Status: AC

## 2016-12-03 MED ORDER — SENNOSIDES-DOCUSATE SODIUM 8.6-50 MG PO TABS: 1.0000 | ORAL_TABLET | Freq: Every day | ORAL | 0 refills | Status: DC

## 2016-12-03 MED ORDER — CEFUROXIME AXETIL 500 MG PO TABS: 500.0000 mg | ORAL_TABLET | Freq: Two times a day (BID) | ORAL | 0 refills | Status: AC

## 2016-12-03 NOTE — Care Management Important Message (Signed)
Important Message  Patient Details  Name: [name] MRN: [id number] Date of Birth: 1950/08/14   Medicare Important Message Given:  Yes    Verda Mehta [place] 12/03/2016, 11:20 AM

## 2016-12-03 NOTE — Social Work (Signed)
Clinical Social Worker facilitated patient discharge including contacting patient family and facility to confirm patient discharge plans.  Clinical information faxed to facility and family agreeable with plan.    CSW arranged ambulance transport via [facility] to [place] at 1:30pm.    RN to call [phone] to give report prior to discharge.  Clinical Social Worker will sign off for now as social work intervention is no longer needed. Please consult [place] again if new need arises.  Elissa Hefty, LCSW Clinical Social Worker [phone]

## 2016-12-03 NOTE — Discharge Summary (Signed)
Physician Discharge Summary  [name] QMV:[id number] DOB: [birth year] DOA: 11/29/2016  PCP: Lucia Gaskins, MD  Admit date: 11/29/2016 Discharge date: 12/03/2016  Admitted From: Home  Disposition: SNF  Recommendations for Outpatient Follow-up:  1. Follow up with PCP in 1-2 weeks 2. Please obtain BMP/CBC in one week 3.    Discharge Condition: Stable.  CODE STATUS: Full code.  Diet recommendation: Heart Healthy   Brief/Interim Summary: 66 year old female with past medical history of bipolar disorder, diabetes, hypertension and morbid obesity sustained a mechanical fall on 10/8 hitting the back of her leg against her toilet. She was unable to stand and she was brought into the emergency room at [facility]. There she was found to have a nondisplaced fracture of the distal femur. Orthopedics on-call requested patient be transferred to [facility] for operative repair.     Assessment & Plan:   Principal Problem:   Other fracture of left femur, initial encounter for closed fracture [facility]) Active Problems:   Morbid obesity ([place])   Hypertension   Bipolar 1 disorder ([place])   Diabetes mellitus without complication ([place])   Closed fracture of left distal femur ([place])   1-Closed distal Fracture of left Femur;  Per ortho.  Lovenox for prophylaxis for one month.  NWB LLE, unlimited ROM knee. Follow up with Dr Lyla Glassing in 2 weeks.   2-Acute hypoxic Respiratory failure. Related to PAN.  On 3 L oxygen currently.  chest x ray; consistent with PNA. Received   Ceftriaxone and Zithromax for 2 days./  She will be discharge on Ceftin for 5 days and  Zithromax for 2 days.   incentive spirometry. Respiratory status stable, WBC trending down. Decreased to 10.  Stable.   3-Essential HTN;  Resume lisinopril.   4-Bipolar; Continue Risperdal, Celexa and Cogentin.  5-Diabetes mellitus on long-term insulin, type II, SSI.  Continue with Levemir. And metformin at  discharge/   Hyperlipidemia: Continue statin  Leukocytosis; started coverage for PNA.  WBC trending down.    Discharge Diagnoses:  Principal Problem:   Other fracture of left femur, initial encounter for closed fracture [facility]) Active Problems:   Morbid obesity ([place])   Hypertension   Bipolar 1 disorder ([place])   Diabetes mellitus without complication ([place])   Closed fracture of left distal femur [facility])    Discharge Instructions  Discharge Instructions    Diet - low sodium heart healthy    Complete by:  As directed    Increase activity slowly    Complete by:  As directed      Allergies as of 12/03/2016      Reactions   Latex Itching   Sulfa Antibiotics       Medication List    STOP taking these medications   FLUZONE HIGH-DOSE 0.5 ML injection Generic drug:  Influenza vac split quadrivalent PF     TAKE these medications   acetaminophen 325 MG tablet Commonly known as:  TYLENOL Take 2 tablets (650 mg total) by mouth every 6 (six) hours as needed for mild pain (or Fever >/= 101).   ALPRAZolam 0.5 MG tablet Commonly known as:  XANAX Take 1 tablet (0.5 mg total) by mouth daily.   aspirin EC 81 MG tablet Take 81 mg by mouth daily.   azithromycin 500 MG tablet Commonly known as:  ZITHROMAX Take 1 tablet (500 mg total) by mouth daily. Take 1 tablet daily for 3 days.   benztropine 1 MG tablet Commonly known as:  COGENTIN Take 1  mg by mouth at bedtime.   cefUROXime 500 MG tablet Commonly known as:  CEFTIN Take 1 tablet (500 mg total) by mouth 2 (two) times daily.   citalopram 40 MG tablet Commonly known as:  CELEXA Take 40 mg by mouth daily.   enoxaparin 40 MG/0.4ML injection Commonly known as:  LOVENOX Inject 0.4 mLs (40 mg total) into the skin daily.   HYDROcodone-acetaminophen 5-325 MG tablet Commonly known as:  NORCO/VICODIN Take 1-2 tablets by mouth every 6 (six) hours as needed for moderate pain.   hydrOXYzine 25 MG tablet Commonly known as:   ATARAX/VISTARIL Take 25 mg by mouth every 6 (six) hours as needed.   insulin detemir 100 UNIT/ML injection Commonly known as:  LEVEMIR Inject 24 Units into the skin at bedtime.   lisinopril 20 MG tablet Commonly known as:  PRINIVIL,ZESTRIL Take 20 mg by mouth daily.   lovastatin 20 MG tablet Commonly known as:  MEVACOR Take 20 mg by mouth at bedtime.   metFORMIN 500 MG tablet Commonly known as:  GLUCOPHAGE Take by mouth 2 (two) times daily with a meal.   naproxen 500 MG tablet Commonly known as:  NAPROSYN Take 500 mg by mouth 2 (two) times daily with a meal.   risperiDONE 2 MG tablet Commonly known as:  RISPERDAL Take 2 mg by mouth at bedtime.   senna-docusate 8.6-50 MG tablet Commonly known as:  Senokot-S Take 1 tablet by mouth at bedtime.       Contact information for follow-up providers    Swinteck, Aaron Edelman, MD. Schedule an appointment as soon as possible for a visit in 2 weeks.   Specialty:  Orthopedic Surgery Why:  For wound re-check, For suture removal Contact information: [address] [address] [phone]            Contact information for after-discharge care    Destination    [facility] .   Specialty:  Skilled Nursing Facility Contact information: [address] [place] [phone]                 Allergies  Allergen Reactions  . Latex Itching  . Sulfa Antibiotics     Consultations:  Dr Lyla Glassing    Procedures/Studies: Ct Knee Left Wo Contrast  Result Date: 11/29/2016 CLINICAL DATA:  Left knee pain with a fracture due to a fall today. Initial encounter. EXAM: CT OF THE LEFT KNEE WITHOUT CONTRAST TECHNIQUE: Multidetector CT imaging of the left knee was performed according to the standard protocol. Multiplanar CT image reconstructions were also generated. COMPARISON:  Plain films left knee this same day. FINDINGS: Bones/Joint/Cartilage As seen on the comparison  plain films, the patient has a fracture of the distal femur. The fracture originates in the posterior cortex of the femoral diaphysis approximately 8 cm above the weight-bearing lateral femoral condyle. The fracture extends in an inferior and medial orientation through the central aspect of the intercondylar notch. The fracture is nondisplaced. No other fracture is identified. Joint space narrowing about the knee is worst medially. Tricompartmental osteophytosis is identified. Ligaments Suboptimally assessed by CT.  Appear intact. Muscles and Tendons Intact. Soft tissues Small Baker's cyst is noted. IMPRESSION: Nondisplaced fracture of the distal femur originates in the lateral, posterior cortex of the diaphysis and extends through the central aspect of the intercondylar notch of the femur. No other acute abnormality. Tricompartmental osteoarthritis. Electronically Signed   By: Inge Rise M.D.   On: 11/29/2016 09:43  Dg Chest Port 1 View  Result Date: 12/01/2016 CLINICAL DATA:  Shortness of breath with onset of hypoxia today. Recent leg injury during a fall. History of diabetes, obesity, former smoker. EXAM: PORTABLE CHEST 1 VIEW COMPARISON:  Chest x-ray of November 29, 2016 FINDINGS: The lungs are adequately inflated. The interstitial markings are coarse at both bases. The cardiac silhouette is top-normal in size. The pulmonary vascularity is normal. The mediastinum is subjectively mildly widened. There is no pleural effusion. The observed bony thorax is unremarkable. IMPRESSION: Bibasilar interstitial densities new since the chest x-ray of February 2012 and likely new since the study of 29 November 2016. This may reflect atelectasis or developing pneumonia. A follow-up PA and lateral chest x-ray would be useful. Top-normal cardiac size without pulmonary vascular congestion or pulmonary edema. Mild mediastinal prominence which is of uncertain significance. Chest CT scanning would be a useful next imaging  step to further evaluate the mediastinum. Electronically Signed   By: David  Martinique M.D.   On: 12/01/2016 12:45   Dg Chest Port 1 View  Result Date: 11/29/2016 CLINICAL DATA:  Hypoxia EXAM: PORTABLE CHEST 1 VIEW COMPARISON:  03/27/2010 FINDINGS: Mild elevation the right hemidiaphragm. Heart is upper limits normal in size. No confluent airspace opacities or effusions. Limited study by body habitus. IMPRESSION: Mildly elevated right hemidiaphragm.  No active disease. Electronically Signed   By: Rolm Baptise M.D.   On: 11/29/2016 10:32   Dg Knee Complete 4 Views Left  Result Date: 11/29/2016 CLINICAL DATA:  67 year old female post fall with left knee pain. Initial encounter. EXAM: LEFT KNEE - COMPLETE 4+ VIEW COMPARISON:  None. FINDINGS: Slightly oblique vertical fracture extends from the distal left femoral medial diametaphysis into the central articular aspect of the distal femur (adjacent to tibial spine) spanning over 8.6 cm. Small joint effusion. Moderate medial tibiofemoral joint space and patellofemoral joint degenerative changes. IMPRESSION: Slightly oblique vertical fracture extends from the distal left femoral medial diametaphysis into the central articular aspect of the distal femur (adjacent to tibial spine) spanning over 8.6 cm. Small joint effusion. Electronically Signed   By: Genia Del M.D.   On: 11/29/2016 07:56   Dg C-arm 1-60 Min  Result Date: 11/30/2016 CLINICAL DATA:  Open reduction and internal fixation of the left distal femoral fracture. Reported fluoro time is 1 minutes, 42 seconds EXAM: LEFT FEMUR 2 VIEWS; DG C-ARM 61-120 MIN COMPARISON:  None. FINDINGS: The patient has undergone plate and screw fixation of an oblique vertically-oriented distal femoral fracture. Alignment of the fracture fragments is anatomic. The side plate and cortical screws appear to be in appropriate position. IMPRESSION: No postprocedure complication following ORIF for a distal left femoral metadiaphyseal  fracture. Electronically Signed   By: David  Martinique M.D.   On: 11/30/2016 14:29   Dg Femur Min 2 Views Left  Result Date: 11/30/2016 CLINICAL DATA:  Open reduction and internal fixation of the left distal femoral fracture. Reported fluoro time is 1 minutes, 42 seconds EXAM: LEFT FEMUR 2 VIEWS; DG C-ARM 61-120 MIN COMPARISON:  None. FINDINGS: The patient has undergone plate and screw fixation of an oblique vertically-oriented distal femoral fracture. Alignment of the fracture fragments is anatomic. The side plate and cortical screws appear to be in appropriate position. IMPRESSION: No postprocedure complication following ORIF for a distal left femoral metadiaphyseal fracture. Electronically Signed   By: David  Martinique M.D.   On: 11/30/2016 14:29   Dg Femur Port Min 2 Views Left  Result Date: 11/30/2016 CLINICAL DATA:  Status post surgical internal fixation of left femoral fracture. EXAM: LEFT FEMUR PORTABLE 2 VIEWS COMPARISON:  Fluoroscopic images of same day. FINDINGS: Status post surgical internal fixation of distal left femoral shaft fracture. Good alignment of fracture components is noted. Expected postoperative findings are noted in the soft tissues. IMPRESSION: Good alignment of distal left femoral fracture components status post surgical internal fixation. Electronically Signed   By: Marijo Conception, M.D.   On: 11/30/2016 15:51       Subjective: Patient report feeling better, denies dyspnea. Knee pain controlled.   Discharge Exam: Vitals:   12/02/16 2006 12/03/16 0600  BP: (!) 162/77 (!) 154/55  Pulse: (!) 108 96  Resp: 17 16  Temp: 99.4 F (37.4 C) 97.7 F (36.5 C)  SpO2: 90% 91%   Vitals:   12/02/16 0600 12/02/16 1447 12/02/16 2006 12/03/16 0600  BP: (!) 142/78 (!) 142/61 (!) 162/77 (!) 154/55  Pulse: 98 92 (!) 108 96  Resp: 16 17 17 16   Temp: 98.9 F (37.2 C) 98.5 F (36.9 C) 99.4 F (37.4 C) 97.7 F (36.5 C)  TempSrc: Oral Oral Oral Oral  SpO2: 94% 93% 90% 91%   Weight:      Height:        General: Pt is alert, awake, not in acute distress Cardiovascular: RRR, S1/S2 +, no rubs, no gallops Respiratory: CTA bilaterally, no wheezing, no rhonchi Abdominal: Soft, NT, ND, bowel sounds + Extremities: no edema, no cyanosis, left LE with dressing.     The results of significant diagnostics from this hospitalization (including imaging, microbiology, ancillary and laboratory) are listed below for reference.     Microbiology: Recent Results (from the past 240 hour(s))  Surgical pcr screen     Status: None   Collection Time: 11/29/16 10:19 PM  Result Value Ref Range Status   MRSA, PCR NEGATIVE NEGATIVE Final   Staphylococcus aureus NEGATIVE NEGATIVE Final    Comment: (NOTE) The Xpert SA Assay (FDA approved for NASAL specimens in patients 66 years of age and older), is one component of a comprehensive surveillance program. It is not intended to diagnose infection nor to guide or monitor treatment.      Labs: BNP (last 3 results) No results for input(s): BNP in the last 8760 hours. Basic Metabolic Panel:  Recent Labs Lab 11/30/16 0531 11/30/16 1638 12/01/16 0535 12/01/16 0800 12/02/16 0541 12/03/16 0714  NA 139  --  136 139 138 140  K 3.7  --  5.3* 4.2 4.0 3.9  CL 103  --  99* 99* 99* 102  CO2 27  --  26 28 29 29   GLUCOSE 114*  --  152* 156* 154* 126*  BUN 11  --  10 10 9 12   CREATININE 0.91 0.94 0.85 1.02* 0.92 0.68  CALCIUM 8.8*  --  8.5* 8.9 8.9 8.6*   Liver Function Tests:  Recent Labs Lab 11/29/16 0945  AST 24  ALT 23  ALKPHOS 85  BILITOT 0.6  PROT 7.3  ALBUMIN 3.9   No results for input(s): LIPASE, AMYLASE in the last 168 hours. No results for input(s): AMMONIA in the last 168 hours. CBC:  Recent Labs Lab 11/29/16 0945  11/30/16 0531 11/30/16 1638 12/01/16 0535 12/02/16 0541 12/03/16 0714  WBC 13.7*  < > 11.2* 15.0* 15.2* 14.0* 10.8*  NEUTROABS 10.8*  --   --   --   --   --   --   HGB 13.9  < > 12.8 13.2  12.3 11.8* 11.7*  HCT 42.0  < > 37.7 40.2 36.7 37.3 35.7*  MCV 86.6  < > 85.7 86.8 86.6 88.0 87.7  PLT 283  < > 247 240 260 255 240  < > = values in this interval not displayed. Cardiac Enzymes: No results for input(s): CKTOTAL, CKMB, CKMBINDEX, TROPONINI in the last 168 hours. BNP: Invalid input(s): POCBNP CBG:  Recent Labs Lab 12/01/16 2223 12/02/16 0629 12/02/16 1140 12/02/16 1707 12/03/16 0618  GLUCAP 96 156* 154* 168* 111*   D-Dimer No results for input(s): DDIMER in the last 72 hours. Hgb A1c No results for input(s): HGBA1C in the last 72 hours. Lipid Profile No results for input(s): CHOL, HDL, LDLCALC, TRIG, CHOLHDL, LDLDIRECT in the last 72 hours. Thyroid function studies No results for input(s): TSH, T4TOTAL, T3FREE, THYROIDAB in the last 72 hours.  Invalid input(s): FREET3 Anemia work up No results for input(s): VITAMINB12, FOLATE, FERRITIN, TIBC, IRON, RETICCTPCT in the last 72 hours. Urinalysis    Component Value Date/Time   COLORURINE YELLOW 03/29/2010 0931   APPEARANCEUR HAZY (A) 03/29/2010 0931   LABSPEC 1.010 03/29/2010 0931   PHURINE 6.0 03/29/2010 0931   GLUCOSEU NEGATIVE 01/12/2008 1642   HGBUR MODERATE (A) 03/29/2010 0931   BILIRUBINUR NEGATIVE 03/29/2010 0931   KETONESUR NEGATIVE 03/29/2010 0931   PROTEINUR NEGATIVE 03/29/2010 0931   UROBILINOGEN 0.2 03/29/2010 0931   NITRITE NEGATIVE 03/29/2010 0931   LEUKOCYTESUR NEGATIVE 03/29/2010 0931   Sepsis Labs Invalid input(s): PROCALCITONIN,  WBC,  LACTICIDVEN Microbiology Recent Results (from the past 240 hour(s))  Surgical pcr screen     Status: None   Collection Time: 11/29/16 10:19 PM  Result Value Ref Range Status   MRSA, PCR NEGATIVE NEGATIVE Final   Staphylococcus aureus NEGATIVE NEGATIVE Final    Comment: (NOTE) The Xpert SA Assay (FDA approved for NASAL specimens in patients 65 years of age and older), is one component of a comprehensive surveillance program. It is not intended to  diagnose infection nor to guide or monitor treatment.      Time coordinating discharge: Over 30 minutes  SIGNED:   Elmarie Shiley, MD  [facility] 12/03/2016, 10:54 AM Pager [phone]  If 7PM-7AM, please contact night-coverage [email or web address] Password [email or web address]

## 2016-12-03 NOTE — Care Management Note (Signed)
Case Management Note  Patient Details  Name: CASEE KNEPP MRN: [id number] Date of Birth: 08-06-1950  Subjective/Objective:    Closed distal fx of left Femur                 Action/Plan: Discharge Planning: Chart reviewed. Scheduled dc today to SNF. CSW following for SNF placement.   PCP  Lucia Gaskins MD  Expected Discharge Date:  12/03/16               Expected Discharge Plan:  [place]  In-House Referral:  Clinical Social Work  Discharge planning Services  CM Consult  Post Acute Care Choice:  NA Choice offered to:  NA  DME Arranged:  N/A DME Agency:  NA  HH Arranged:  NA HH Agency:  NA  Status of Service:  Completed, signed off  If discussed at [place] of Stay Meetings, dates discussed:    Additional Comments:  Erenest Rasher, RN 12/03/2016, 11:29 AM

## 2016-12-03 NOTE — Clinical Social Work Placement (Signed)
   CLINICAL SOCIAL WORK PLACEMENT  NOTE  Date:  12/03/2016  Patient Details  Name: [name] MRN: [id number] Date of Birth: [birth year]  Clinical Social Work is seeking post-discharge placement for this patient at the [place] level of care (*CSW will initial, date and re-position this form in  chart as items are completed):  Yes   Patient/family provided with [place] Work Department's list of facilities offering this level of care within the geographic area requested by the patient (or if unable, by the patient's family).  Yes   Patient/family informed of their freedom to choose among providers that offer the needed level of care, that participate in Medicare, Medicaid or managed care program needed by the patient, have an available bed and are willing to accept the patient.  Yes   Patient/family informed of [place]'s ownership interest in [facility] and [facility], as well as of the fact that they are under no obligation to receive care at these facilities.  PASRR submitted to EDS on       PASRR number received on 12/01/16     Existing PASRR number confirmed on       FL2 transmitted to all facilities in geographic area requested by pt/family on 12/01/16     FL2 transmitted to all facilities within larger geographic area on       Patient informed that his/her managed care company has contracts with or will negotiate with certain facilities, including the following:        Yes   Patient/family informed of bed offers received.  Patient chooses bed at [facility]     Physician recommends and patient chooses bed at      Patient to be transferred to [facility] [place] on 12/03/16.  Patient to be transferred to facility by [facility]     Patient family notified on 12/03/16 of transfer.  Name of family member notified:  patient responsible for self     PHYSICIAN Please prepare prescriptions     Additional Comment:     _______________________________________________ Normajean Baxter, LCSW 12/03/2016, 11:37 AM

## 2016-12-03 NOTE — Social Work (Signed)
CSW received call from admission staff that they have received Insurance Auth for placement at [facility].  CSW f/u with doctor of discharge as SNF would like patient early today due to weather.  Elissa Hefty, LCSW Clinical Social Worker [phone]

## 2017-10-03 ENCOUNTER — Ambulatory Visit (HOSPITAL_COMMUNITY)
Admission: RE | Admit: 2017-10-03 | Discharge: 2017-10-03 | Disposition: A | Discharge status: Home / Self Care | Payer: Medicare HMO | Source: Ambulatory Visit | Attending: Family Medicine | Admitting: Family Medicine

## 2017-10-03 ENCOUNTER — Other Ambulatory Visit (HOSPITAL_COMMUNITY): Payer: Self-pay | Admitting: Family Medicine

## 2017-10-03 DIAGNOSIS — M25551 Pain in right hip: Secondary | ICD-10-CM

## 2017-10-03 DIAGNOSIS — M5136 Other intervertebral disc degeneration, lumbar region: Secondary | ICD-10-CM | POA: Insufficient documentation

## 2017-10-03 DIAGNOSIS — M545 Low back pain: Secondary | ICD-10-CM | POA: Diagnosis present

## 2017-10-19 ENCOUNTER — Encounter: Payer: Self-pay | Admitting: Internal Medicine

## 2017-12-06 ENCOUNTER — Encounter (HOSPITAL_COMMUNITY): Payer: Self-pay

## 2017-12-06 ENCOUNTER — Emergency Department (HOSPITAL_COMMUNITY): Payer: Medicare HMO

## 2017-12-06 ENCOUNTER — Other Ambulatory Visit: Payer: Self-pay

## 2017-12-06 ENCOUNTER — Emergency Department (HOSPITAL_COMMUNITY)
Admission: EM | Admit: 2017-12-06 | Discharge: 2017-12-06 | Disposition: A | Discharge status: Home / Self Care | Payer: Medicare HMO | Attending: Emergency Medicine | Admitting: Emergency Medicine

## 2017-12-06 DIAGNOSIS — I10 Essential (primary) hypertension: Secondary | ICD-10-CM | POA: Diagnosis not present

## 2017-12-06 DIAGNOSIS — E119 Type 2 diabetes mellitus without complications: Secondary | ICD-10-CM | POA: Diagnosis not present

## 2017-12-06 DIAGNOSIS — Z79899 Other long term (current) drug therapy: Secondary | ICD-10-CM | POA: Diagnosis not present

## 2017-12-06 DIAGNOSIS — Z7982 Long term (current) use of aspirin: Secondary | ICD-10-CM | POA: Insufficient documentation

## 2017-12-06 DIAGNOSIS — Z91040 Latex allergy status: Secondary | ICD-10-CM | POA: Insufficient documentation

## 2017-12-06 DIAGNOSIS — E860 Dehydration: Secondary | ICD-10-CM | POA: Insufficient documentation

## 2017-12-06 DIAGNOSIS — J441 Chronic obstructive pulmonary disease with (acute) exacerbation: Secondary | ICD-10-CM | POA: Insufficient documentation

## 2017-12-06 DIAGNOSIS — Z794 Long term (current) use of insulin: Secondary | ICD-10-CM | POA: Diagnosis not present

## 2017-12-06 DIAGNOSIS — Z859 Personal history of malignant neoplasm, unspecified: Secondary | ICD-10-CM | POA: Diagnosis not present

## 2017-12-06 DIAGNOSIS — R0602 Shortness of breath: Secondary | ICD-10-CM | POA: Diagnosis present

## 2017-12-06 DIAGNOSIS — Z87891 Personal history of nicotine dependence: Secondary | ICD-10-CM | POA: Insufficient documentation

## 2017-12-06 LAB — CBC WITH DIFFERENTIAL/PLATELET
Abs Immature Granulocytes: 0.03 10*3/uL (ref 0.00–0.07)
Basophils Absolute: 0.1 10*3/uL (ref 0.0–0.1)
Basophils Relative: 1 %
EOS ABS: 0.1 10*3/uL (ref 0.0–0.5)
Eosinophils Relative: 2 %
HCT: 42.8 % (ref 36.0–46.0)
Hemoglobin: 14.4 g/dL (ref 12.0–15.0)
Immature Granulocytes: 0 %
Lymphocytes Relative: 32 %
Lymphs Abs: 2.7 10*3/uL (ref 0.7–4.0)
MCH: 30.4 pg (ref 26.0–34.0)
MCHC: 33.6 g/dL (ref 30.0–36.0)
MCV: 90.5 fL (ref 80.0–100.0)
MONO ABS: 0.6 10*3/uL (ref 0.1–1.0)
MONOS PCT: 7 %
Neutro Abs: 5 10*3/uL (ref 1.7–7.7)
Neutrophils Relative %: 58 %
Platelets: 388 10*3/uL (ref 150–400)
RBC: 4.73 MIL/uL (ref 3.87–5.11)
RDW: 13.3 % (ref 11.5–15.5)
WBC: 8.4 10*3/uL (ref 4.0–10.5)
nRBC: 0 % (ref 0.0–0.2)

## 2017-12-06 LAB — COMPREHENSIVE METABOLIC PANEL
ALBUMIN: 4.2 g/dL (ref 3.5–5.0)
ALT: 33 U/L (ref 0–44)
ANION GAP: 13 (ref 5–15)
AST: 39 U/L (ref 15–41)
Alkaline Phosphatase: 84 U/L (ref 38–126)
BILIRUBIN TOTAL: 1.3 mg/dL — AB (ref 0.3–1.2)
BUN: 16 mg/dL (ref 8–23)
CALCIUM: 9.4 mg/dL (ref 8.9–10.3)
CO2: 20 mmol/L — ABNORMAL LOW (ref 22–32)
Chloride: 106 mmol/L (ref 98–111)
Creatinine, Ser: 1.24 mg/dL — ABNORMAL HIGH (ref 0.44–1.00)
GFR calc non Af Amer: 45 mL/min — ABNORMAL LOW (ref >60)
GFR, EST AFRICAN AMERICAN: 52 mL/min — AB (ref >60)
GLUCOSE: 125 mg/dL — AB (ref 70–99)
POTASSIUM: 3.9 mmol/L (ref 3.5–5.1)
SODIUM: 139 mmol/L (ref 135–145)
TOTAL PROTEIN: 7.6 g/dL (ref 6.5–8.1)

## 2017-12-06 LAB — TROPONIN I

## 2017-12-06 LAB — BRAIN NATRIURETIC PEPTIDE: B Natriuretic Peptide: 12 pg/mL (ref 0.0–100.0)

## 2017-12-06 MED ORDER — PREDNISONE 20 MG PO TABS: 40.0000 mg | ORAL_TABLET | Freq: Every day | ORAL | 0 refills | Status: DC

## 2017-12-06 MED ORDER — SODIUM CHLORIDE 0.9 % IV BOLUS
1000.0000 mL | Freq: Once | INTRAVENOUS | Status: AC
  Administered 2017-12-06: 1000 mL via INTRAVENOUS

## 2017-12-06 MED ORDER — ALBUTEROL SULFATE (2.5 MG/3ML) 0.083% IN NEBU
5.0000 mg | INHALATION_SOLUTION | Freq: Once | RESPIRATORY_TRACT | Status: AC
  Administered 2017-12-06: 5 mg via RESPIRATORY_TRACT
  Filled 2017-12-06: qty 6

## 2017-12-06 MED ORDER — PREDNISONE 50 MG PO TABS
60.0000 mg | ORAL_TABLET | Freq: Once | ORAL | Status: AC
  Administered 2017-12-06: 60 mg via ORAL
  Filled 2017-12-06: qty 1

## 2017-12-06 NOTE — ED Triage Notes (Signed)
Pt states she has not been able to breathe normally for the last few weeks. Pt has been very nauseated and has thrown up as well as had diarrhea. Chills and weakness. Fevers at home up to 101.2.

## 2017-12-06 NOTE — Discharge Instructions (Signed)
As discussed, your evaluation today has been largely reassuring.  But, it is important that you monitor your condition carefully, and do not hesitate to return to the ED if you develop new, or concerning changes in your condition.  Please take all medication as directed, and complete the course of steroids prescribed today. Equally important is that you stay well-hydrated.  Return here for any concerning changes in your condition.

## 2017-12-06 NOTE — ED Notes (Signed)
Patient given discharge instruction, verbalized understand. IV removed, band aid applied. Patient wheelchair out of the department.  

## 2017-12-06 NOTE — ED Provider Notes (Signed)
[facility] EMERGENCY DEPARTMENT Provider Note   CSN: [id number] Arrival date & time: 12/06/17  1226     History   Chief Complaint Chief Complaint  Patient presents with  . Shortness of Breath    HPI [name] is a 67 y.o. female.  HPI Presents with concern of dyspnea, congestion, dizziness, lightheadedness, diarrhea. Onset seems to have been a few weeks or possibly months ago, and symptoms have been persistent in spite of a course of antibiotics, and multiple physician visits. When asked what is different today compared to yesterday, she states nothing. It seems that her nausea and vomiting have actually resolved, and she is having less diarrhea than recently. She is unclear when her last objective fever was, but seemingly not today. She acknowledges multiple medical issues, states that she takes all medication as directed. Past Medical History:  Diagnosis Date  . Bipolar 1 disorder ([place])   . Cancer ([place])   . Diabetes mellitus without complication ([place])   . Hypertension     Patient Active Problem List   Diagnosis Date Noted  . Hypertension 11/30/2016  . Bipolar 1 disorder ([place]) 11/30/2016  . Diabetes mellitus without complication ([place]) 32/67/1245  . Closed fracture of left distal femur ([place]) 11/30/2016  . Other fracture of left femur, initial encounter for closed fracture ([place]) 11/29/2016  . Morbid obesity ([place]) 11/29/2016    Past Surgical History:  Procedure Laterality Date  . ABDOMINAL HYSTERECTOMY    . CHOLECYSTECTOMY    . ORIF FEMUR FRACTURE Left 11/30/2016   Procedure: OPEN REDUCTION INTERNAL FIXATION (ORIF) DISTAL FEMUR FRACTURE;  Surgeon: Rod Can, MD;  Location: [place];  Service: Orthopedics;  Laterality: Left;  . TONSILLECTOMY       OB History   None      Home Medications    Prior to Admission medications   Medication Sig Start Date End Date Taking? Authorizing Provider  acetaminophen (TYLENOL) 325 MG tablet Take 2 tablets (650 mg  total) by mouth every 6 (six) hours as needed for mild pain (or Fever >/= 101). 12/02/16  Yes Regalado, Belkys A, MD  ALPRAZolam (XANAX) 0.5 MG tablet Take 1 tablet (0.5 mg total) by mouth daily. 12/03/16  Yes Regalado, Belkys A, MD  aspirin EC 81 MG tablet Take 81 mg by mouth daily.   Yes [provider]  citalopram (CELEXA) 40 MG tablet Take 40 mg by mouth daily.   Yes [provider]  hydrochlorothiazide (HYDRODIURIL) 25 MG tablet TAKE 1 TABLET BY MOUTH ONCE DAILY FOR BLOOD PRESSURE 11/06/17  Yes [provider]  hydrOXYzine (ATARAX/VISTARIL) 25 MG tablet Take 25 mg by mouth every 6 (six) hours as needed.   Yes [provider]  insulin detemir (LEVEMIR) 100 UNIT/ML injection Inject 24 Units into the skin at bedtime.   Yes [provider]  lisinopril (PRINIVIL,ZESTRIL) 20 MG tablet Take 20 mg by mouth daily.   Yes [provider]  lovastatin (MEVACOR) 20 MG tablet Take 20 mg by mouth at bedtime.   Yes [provider]  metFORMIN (GLUCOPHAGE) 500 MG tablet Take by mouth 2 (two) times daily with a meal.   Yes [provider]  metoprolol tartrate (LOPRESSOR) 50 MG tablet Take 50 mg by mouth 2 (two) times daily. 08/30/17  Yes [provider]  pantoprazole (PROTONIX) 40 MG tablet TAKE 1 TABLET BY MOUTH ONCE DAILY (STOP NEXIUM ESOMEPRAZOLE) 11/28/17  Yes [provider]  promethazine (PHENERGAN) 25 MG tablet TAKE 1 TABLET BY  MOUTH 2 TO 3 TIMES DAILY AS NEEDED FOR NAUSEA FOR VOMITING 12/02/17  Yes [provider]  risperiDONE (RISPERDAL) 2 MG tablet Take 2 mg by mouth at bedtime.   Yes [provider]  amoxicillin (AMOXIL) 500 MG capsule Take 500 mg by mouth 2 (two) times daily. 11/16/17   [provider]  BYDUREON 2 MG PEN One injection once a week wed 10/18/17   [provider]  senna-docusate (SENOKOT-S) 8.6-50 MG tablet Take 1 tablet by mouth at bedtime. 12/03/16   Regalado, Cassie Freer,  MD    Family History No family history on file.  Social History Social History   Tobacco Use  . Smoking status: Former Research scientist (life sciences)  . Smokeless tobacco: Never Used  Substance Use Topics  . Alcohol use: No  . Drug use: No     Allergies   Latex and Sulfa antibiotics   Review of Systems Review of Systems  Constitutional:       Per HPI, otherwise negative  HENT:       Per HPI, otherwise negative  Respiratory:       Per HPI, otherwise negative  Cardiovascular:       Per HPI, otherwise negative  Gastrointestinal: Positive for diarrhea, nausea and vomiting.  Endocrine:       Negative aside from HPI  Genitourinary:       Neg aside from HPI   Musculoskeletal:       Per HPI, otherwise negative  Skin: Negative.   Neurological: Positive for dizziness. Negative for syncope.     Physical Exam Updated Vital Signs BP 128/61   Pulse 90   Temp 98.5 F (36.9 C) (Oral)   Resp (!) 24   Ht 5\' 3"  (1.6 m)   Wt 117.9 kg   SpO2 96%   BMI 46.06 kg/m   Physical Exam  Constitutional: She is oriented to person, place, and time. She appears well-developed and well-nourished. No distress.  Uncomfortable appearing obese elderly female with obvious rhinorrhea  HENT:  Head: Normocephalic and atraumatic.  Eyes: Conjunctivae and EOM are normal.  Cardiovascular: Normal rate and regular rhythm.  Pulmonary/Chest: Effort normal and breath sounds normal. No stridor. No respiratory distress.  Abdominal: She exhibits no distension.  Musculoskeletal: She exhibits no edema.  Neurological: She is alert and oriented to person, place, and time. No cranial nerve deficit.  Skin: Skin is warm and dry.  Psychiatric: Her mood appears anxious.  Nursing note and vitals reviewed.    ED Treatments / Results  Labs (all labs ordered are listed, but only abnormal results are displayed) Labs Reviewed  COMPREHENSIVE METABOLIC PANEL - Abnormal; Notable for the following components:      Result Value   CO2  20 (*)    Glucose, Bld 125 (*)    Creatinine, Ser 1.24 (*)    Total Bilirubin 1.3 (*)    GFR calc non Af Amer 45 (*)    GFR calc Af Amer 52 (*)    All other components within normal limits  CBC WITH DIFFERENTIAL/PLATELET  TROPONIN I  BRAIN NATRIURETIC PEPTIDE    EKG EKG Interpretation  Date/Time:  Tuesday December 06 2017 13:02:56 EDT Ventricular Rate:  102 PR Interval:    QRS Duration: 86 QT Interval:  387 QTC Calculation: 505 R Axis:   -18 Text Interpretation:  Sinus tachycardia Borderline left axis deviation Anterior injury pattern No significant change since last tracing Reconfirmed by Carmin Muskrat [phone]) on 12/06/2017 1:20:39 PM   Radiology  Dg Chest 2 View  Result Date: 12/06/2017 CLINICAL DATA:  Shortness of breath, unable to breathe normally for the last few weeks, nausea, vomiting, diarrhea, chills, weakness, fever EXAM: CHEST - 2 VIEW COMPARISON:  12/01/2016 FINDINGS: Normal heart size, mediastinal contours, and pulmonary vascularity. Mild chronic bronchitic changes. Elevation of RIGHT diaphragm unchanged. Lungs otherwise clear. No pleural effusion or pneumothorax. Bones demineralized. IMPRESSION: Mild chronic bronchitic changes without infiltrate. Electronically Signed   By: Lavonia Dana M.D.   On: 12/06/2017 13:42    Procedures Procedures (including critical care time)  Medications Ordered in ED Medications  sodium chloride 0.9 % bolus 1,000 mL (1,000 mLs Intravenous New Bag/Given 12/06/17 1519)  albuterol (PROVENTIL) (2.5 MG/3ML) 0.083% nebulizer solution 5 mg (5 mg Nebulization Given 12/06/17 1355)     Initial Impression / Assessment and Plan / ED Course  I have reviewed the triage vital signs and the nursing notes.  Pertinent labs & imaging results that were available during my care of the patient were reviewed by me and considered in my medical decision making (see chart for details).     3:20 PM On repeat exam the patient is in similar  condition. We discussed all findings including concern for mild dehydration, as well as bronchitis. There is no evidence for pneumonia, bacteremia, sepsis, without ongoing substantial pain, with reassuring EKG, low suspicion for atypical ACS, no evidence for heart failure. Patient will receive fluid resuscitation, bronchodilator, steroids, follow-up with primary care.   Final Clinical Impressions(s) / ED Diagnoses  COPD exacerbation Dehydration   Carmin Muskrat, MD 12/06/17 1521

## 2017-12-06 NOTE — ED Notes (Signed)
Pt getting IV fluids

## 2017-12-06 NOTE — ED Notes (Signed)
IV would not flush. Blew, labs drawn

## 2018-01-24 ENCOUNTER — Telehealth: Payer: Self-pay | Admitting: Nurse Practitioner

## 2018-01-24 ENCOUNTER — Ambulatory Visit: Payer: Medicare HMO | Admitting: Nurse Practitioner

## 2018-01-24 ENCOUNTER — Encounter: Payer: Self-pay | Admitting: Nurse Practitioner

## 2018-01-24 NOTE — Telephone Encounter (Signed)
PATIENT WAS A NO SHOW AND LETTER SENT  °

## 2018-01-24 NOTE — Progress Notes (Deleted)
Primary Care Physician:  [name], MD Primary Gastroenterologist:  Dr. Gala Romney  No chief complaint on file.   HPI:   [name] is a 67 y.o. female who presents on referral from primary care to schedule colonoscopy.  Nurse/phone triage was deferred to office visit due to medications.  The patient has not been seen by our office since 2003.  No history of colonoscopy in our system.  Today she states   Past Medical History:  Diagnosis Date  . Bipolar 1 disorder ([place])   . Cancer ([place])   . Diabetes mellitus without complication ([place])   . Hypertension     Past Surgical History:  Procedure Laterality Date  . ABDOMINAL HYSTERECTOMY    . CHOLECYSTECTOMY    . ORIF FEMUR FRACTURE Left 11/30/2016   Procedure: OPEN REDUCTION INTERNAL FIXATION (ORIF) DISTAL FEMUR FRACTURE;  Surgeon: Rod Can, MD;  Location: [place];  Service: Orthopedics;  Laterality: Left;  . TONSILLECTOMY      Current Outpatient Medications  Medication Sig Dispense Refill  . acetaminophen (TYLENOL) 325 MG tablet Take 2 tablets (650 mg total) by mouth every 6 (six) hours as needed for mild pain (or Fever >/= 101). 30 tablet 0  . ALPRAZolam (XANAX) 0.5 MG tablet Take 1 tablet (0.5 mg total) by mouth daily. 30 tablet 0  . amoxicillin (AMOXIL) 500 MG capsule Take 500 mg by mouth 2 (two) times daily.  0  . aspirin EC 81 MG tablet Take 81 mg by mouth daily.    Marland Kitchen BYDUREON 2 MG PEN One injection once a week wed  0  . citalopram (CELEXA) 40 MG tablet Take 40 mg by mouth daily.    . hydrochlorothiazide (HYDRODIURIL) 25 MG tablet TAKE 1 TABLET BY MOUTH ONCE DAILY FOR BLOOD PRESSURE  0  . hydrOXYzine (ATARAX/VISTARIL) 25 MG tablet Take 25 mg by mouth every 6 (six) hours as needed.    . insulin detemir (LEVEMIR) 100 UNIT/ML injection Inject 24 Units into the skin at bedtime.    Mar[place] Kitchen lisinopril (PRINIVIL,ZESTRIL) 20 MG tablet Take 20 mg by mouth daily.    [place] Kitchen lovastatin (MEVACOR) 20 MG tablet Take 20 mg by mouth at  bedtime.    . metFORMIN (GLUCOPHAGE) 500 MG tablet Take by mouth 2 (two) times daily with a meal.    . metoprolol tartrate (LOPRESSOR) 50 MG tablet Take 50 mg by mouth 2 (two) times daily.  6  . pantoprazole (PROTONIX) 40 MG tablet TAKE 1 TABLET BY MOUTH ONCE DAILY (STOP NEXIUM ESOMEPRAZOLE)  3  . predniSONE (DELTASONE) 20 MG tablet Take 2 tablets (40 mg total) by mouth daily with breakfast. For the next four days 8 tablet 0  . promethazine (PHENERGAN) 25 MG tablet TAKE 1 TABLET BY MOUTH 2 TO 3 TIMES DAILY AS NEEDED FOR NAUSEA FOR VOMITING  99  . risperiDONE (RISPERDAL) 2 MG tablet Take 2 mg by mouth at bedtime.    . senna-docusate (SENOKOT-S) 8.6-50 MG tablet Take 1 tablet by mouth at bedtime. 30 tablet 0   No current facility-administered medications for this visit.     Allergies as of 01/24/2018 - Review Complete 12/06/2017  Allergen Reaction Noted  . Latex Itching 11/30/2016  . Sulfa antibiotics  11/29/2016    No family history on file.  Social History   Socioeconomic History  . Marital status: Married    Spouse name: Not on file  . Number of children: Not on file  . Years of education: Not  on file  . Highest education level: Not on file  Occupational History  . Not on file  Social Needs  . Financial resource strain: Not on file  . Food insecurity:    Worry: Not on file    Inability: Not on file  . Transportation needs:    Medical: Not on file    Non-medical: Not on file  Tobacco Use  . Smoking status: Former Research scientist (life sciences)  . Smokeless tobacco: Never Used  Substance and Sexual Activity  . Alcohol use: No  . Drug use: No  . Sexual activity: Not on file  Lifestyle  . Physical activity:    Days per week: Not on file    Minutes per session: Not on file  . Stress: Not on file  Relationships  . Social connections:    Talks on phone: Not on file    Gets together: Not on file    Attends religious service: Not on file    Active member of club or organization: Not on file     Attends meetings of clubs or organizations: Not on file    Relationship status: Not on file  . Intimate partner violence:    Fear of current or ex partner: Not on file    Emotionally abused: Not on file    Physically abused: Not on file    Forced sexual activity: Not on file  Other Topics Concern  . Not on file  Social History Narrative  . Not on file    Review of Systems: General: Negative for anorexia, weight loss, fever, chills, fatigue, weakness. Eyes: Negative for vision changes.  ENT: Negative for hoarseness, difficulty swallowing , nasal congestion. CV: Negative for chest pain, angina, palpitations, dyspnea on exertion, peripheral edema.  Respiratory: Negative for dyspnea at rest, dyspnea on exertion, cough, sputum, wheezing.  GI: See history of present illness. GU:  Negative for dysuria, hematuria, urinary incontinence, urinary frequency, nocturnal urination.  MS: Negative for joint pain, low back pain.  Derm: Negative for rash or itching.  Neuro: Negative for weakness, abnormal sensation, seizure, frequent headaches, memory loss, confusion.  Psych: Negative for anxiety, depression, suicidal ideation, hallucinations.  Endo: Negative for unusual weight change.  Heme: Negative for bruising or bleeding. Allergy: Negative for rash or hives.    Physical Exam: There were no vitals taken for this visit. General:   Alert and oriented. Pleasant and cooperative. Well-nourished and well-developed.  Head:  Normocephalic and atraumatic. Eyes:  Without icterus, sclera clear and conjunctiva pink.  Ears:  Normal auditory acuity. Mouth:  No deformity or lesions, oral mucosa pink.  Throat/Neck:  Supple, without mass or thyromegaly. Cardiovascular:  S1, S2 present without murmurs appreciated. Normal pulses noted. Extremities without clubbing or edema. Respiratory:  Clear to auscultation bilaterally. No wheezes, rales, or rhonchi. No distress.  Gastrointestinal:  +BS, soft, non-tender  and non-distended. No HSM noted. No guarding or rebound. No masses appreciated.  Rectal:  Deferred  Musculoskalatal:  Symmetrical without gross deformities. Normal posture. Skin:  Intact without significant lesions or rashes. Neurologic:  Alert and oriented x4;  grossly normal neurologically. Psych:  Alert and cooperative. Normal mood and affect. Heme/Lymph/Immune: No significant cervical adenopathy. No excessive bruising noted.    01/24/2018 7:48 AM   Disclaimer: This note was dictated with voice recognition software. Similar sounding words can inadvertently be transcribed and may not be corrected upon review.

## 2018-01-25 NOTE — Telephone Encounter (Signed)
Noted  

## 2018-03-06 ENCOUNTER — Ambulatory Visit (INDEPENDENT_AMBULATORY_CARE_PROVIDER_SITE_OTHER): Payer: Medicare HMO | Admitting: Otolaryngology

## 2018-03-10 ENCOUNTER — Other Ambulatory Visit (HOSPITAL_COMMUNITY): Payer: Self-pay | Admitting: Respiratory Therapy

## 2018-03-10 DIAGNOSIS — R0602 Shortness of breath: Secondary | ICD-10-CM

## 2018-03-30 ENCOUNTER — Ambulatory Visit (INDEPENDENT_AMBULATORY_CARE_PROVIDER_SITE_OTHER): Payer: Medicare HMO | Admitting: Otolaryngology

## 2018-04-05 ENCOUNTER — Ambulatory Visit (HOSPITAL_COMMUNITY)
Admission: RE | Admit: 2018-04-05 | Discharge: 2018-04-05 | Disposition: A | Discharge status: Home / Self Care | Payer: Medicare HMO | Source: Ambulatory Visit | Attending: Internal Medicine | Admitting: Internal Medicine

## 2018-04-05 ENCOUNTER — Emergency Department (HOSPITAL_COMMUNITY): Payer: Medicare HMO

## 2018-04-05 ENCOUNTER — Observation Stay (HOSPITAL_COMMUNITY)
Admission: EM | Admit: 2018-04-05 | Discharge: 2018-04-06 | Disposition: A | Discharge status: Home / Self Care | Payer: Medicare HMO | Attending: Internal Medicine | Admitting: Internal Medicine

## 2018-04-05 ENCOUNTER — Encounter (HOSPITAL_COMMUNITY): Payer: Self-pay | Admitting: *Deleted

## 2018-04-05 ENCOUNTER — Other Ambulatory Visit: Payer: Self-pay

## 2018-04-05 DIAGNOSIS — Z91040 Latex allergy status: Secondary | ICD-10-CM | POA: Diagnosis not present

## 2018-04-05 DIAGNOSIS — Z794 Long term (current) use of insulin: Secondary | ICD-10-CM | POA: Insufficient documentation

## 2018-04-05 DIAGNOSIS — I10 Essential (primary) hypertension: Secondary | ICD-10-CM | POA: Insufficient documentation

## 2018-04-05 DIAGNOSIS — R079 Chest pain, unspecified: Secondary | ICD-10-CM | POA: Diagnosis not present

## 2018-04-05 DIAGNOSIS — R0789 Other chest pain: Principal | ICD-10-CM | POA: Insufficient documentation

## 2018-04-05 DIAGNOSIS — Z7982 Long term (current) use of aspirin: Secondary | ICD-10-CM | POA: Insufficient documentation

## 2018-04-05 DIAGNOSIS — Z859 Personal history of malignant neoplasm, unspecified: Secondary | ICD-10-CM | POA: Diagnosis not present

## 2018-04-05 DIAGNOSIS — Z79899 Other long term (current) drug therapy: Secondary | ICD-10-CM | POA: Diagnosis not present

## 2018-04-05 DIAGNOSIS — Z87891 Personal history of nicotine dependence: Secondary | ICD-10-CM | POA: Insufficient documentation

## 2018-04-05 DIAGNOSIS — R072 Precordial pain: Secondary | ICD-10-CM

## 2018-04-05 DIAGNOSIS — E119 Type 2 diabetes mellitus without complications: Secondary | ICD-10-CM | POA: Insufficient documentation

## 2018-04-05 DIAGNOSIS — R0602 Shortness of breath: Secondary | ICD-10-CM | POA: Insufficient documentation

## 2018-04-05 LAB — COMPREHENSIVE METABOLIC PANEL
ALBUMIN: 4.2 g/dL (ref 3.5–5.0)
ALK PHOS: 66 U/L (ref 38–126)
ALT: 23 U/L (ref 0–44)
ANION GAP: 12 (ref 5–15)
AST: 25 U/L (ref 15–41)
BILIRUBIN TOTAL: 0.7 mg/dL (ref 0.3–1.2)
BUN: 18 mg/dL (ref 8–23)
CO2: 24 mmol/L (ref 22–32)
Calcium: 9.5 mg/dL (ref 8.9–10.3)
Chloride: 102 mmol/L (ref 98–111)
Creatinine, Ser: 0.86 mg/dL (ref 0.44–1.00)
GFR calc non Af Amer: >60 mL/min (ref >60)
GLUCOSE: 137 mg/dL — AB (ref 70–99)
Potassium: 4 mmol/L (ref 3.5–5.1)
SODIUM: 138 mmol/L (ref 135–145)
Total Protein: 7.5 g/dL (ref 6.5–8.1)

## 2018-04-05 LAB — SPIROMETRY WITH GRAPH
FEF 25-75 PRE: 1.71 L/s
FEF2575-%PRED-PRE: 82 %
FEV1-%PRED-PRE: 90 %
FEV1-Pre: 2.1 L
FEV1FVC-%PRED-PRE: 100 %
FEV6-%Pred-Pre: 92 %
FEV6-Pre: 2.68 L
FEV6FVC-%Pred-Pre: 102 %
FVC-%Pred-Pre: 89 %
FVC-PRE: 2.72 L
PRE FEV1/FVC RATIO: 77 %
PRE FEV6/FVC RATIO: 99 %

## 2018-04-05 LAB — CBC WITH DIFFERENTIAL/PLATELET
Abs Immature Granulocytes: 0.04 10*3/uL (ref 0.00–0.07)
Basophils Absolute: 0.1 10*3/uL (ref 0.0–0.1)
Basophils Relative: 1 %
EOS PCT: 3 %
Eosinophils Absolute: 0.3 10*3/uL (ref 0.0–0.5)
HEMATOCRIT: 43.9 % (ref 36.0–46.0)
HEMOGLOBIN: 14.5 g/dL (ref 12.0–15.0)
Immature Granulocytes: 0 %
LYMPHS PCT: 26 %
Lymphs Abs: 2.5 10*3/uL (ref 0.7–4.0)
MCH: 29.5 pg (ref 26.0–34.0)
MCHC: 33 g/dL (ref 30.0–36.0)
MCV: 89.2 fL (ref 80.0–100.0)
Monocytes Absolute: 0.6 10*3/uL (ref 0.1–1.0)
Monocytes Relative: 6 %
Neutro Abs: 6.1 10*3/uL (ref 1.7–7.7)
Neutrophils Relative %: 64 %
Platelets: 325 10*3/uL (ref 150–400)
RBC: 4.92 MIL/uL (ref 3.87–5.11)
RDW: 12.7 % (ref 11.5–15.5)
WBC: 9.6 10*3/uL (ref 4.0–10.5)
nRBC: 0 % (ref 0.0–0.2)

## 2018-04-05 LAB — GLUCOSE, CAPILLARY
Glucose-Capillary: 97 mg/dL (ref 70–99)
Glucose-Capillary: 103 mg/dL — ABNORMAL HIGH (ref 70–99)

## 2018-04-05 LAB — TROPONIN I
Troponin I: <0.03 ng/mL (ref <0.03)
Troponin I: <0.03 ng/mL (ref <0.03)

## 2018-04-05 LAB — CBG MONITORING, ED: Glucose-Capillary: 137 mg/dL — ABNORMAL HIGH (ref 70–99)

## 2018-04-05 LAB — D-DIMER, QUANTITATIVE: D-Dimer, Quant: 0.45 ug/mL-FEU (ref 0.00–0.50)

## 2018-04-05 MED ORDER — PRAVASTATIN SODIUM 10 MG PO TABS
20.0000 mg | ORAL_TABLET | Freq: Every day | ORAL | Status: DC
  Administered 2018-04-05: 20 mg via ORAL
  Filled 2018-04-05: qty 2

## 2018-04-05 MED ORDER — ACETAMINOPHEN 650 MG RE SUPP: 650.0000 mg | Freq: Four times a day (QID) | RECTAL | Status: DC | PRN

## 2018-04-05 MED ORDER — INSULIN DETEMIR 100 UNIT/ML ~~LOC~~ SOLN
12.0000 [IU] | Freq: Every day | SUBCUTANEOUS | Status: DC
  Administered 2018-04-05: 12 [IU] via SUBCUTANEOUS
  Filled 2018-04-05 (×2): qty 0.12

## 2018-04-05 MED ORDER — POLYETHYLENE GLYCOL 3350 17 G PO PACK: 17.0000 g | PACK | Freq: Every day | ORAL | Status: DC | PRN

## 2018-04-05 MED ORDER — ASPIRIN 81 MG PO CHEW
324.0000 mg | CHEWABLE_TABLET | Freq: Once | ORAL | Status: AC
  Administered 2018-04-05: 324 mg via ORAL
  Filled 2018-04-05: qty 4

## 2018-04-05 MED ORDER — ONDANSETRON HCL 4 MG PO TABS: 4.0000 mg | ORAL_TABLET | Freq: Four times a day (QID) | ORAL | Status: DC | PRN

## 2018-04-05 MED ORDER — CITALOPRAM HYDROBROMIDE 20 MG PO TABS
40.0000 mg | ORAL_TABLET | Freq: Every day | ORAL | Status: DC
  Administered 2018-04-06: 40 mg via ORAL
  Filled 2018-04-05: qty 2

## 2018-04-05 MED ORDER — INSULIN ASPART 100 UNIT/ML ~~LOC~~ SOLN
0.0000 [IU] | Freq: Four times a day (QID) | SUBCUTANEOUS | Status: DC
  Administered 2018-04-06: 1 [IU] via SUBCUTANEOUS

## 2018-04-05 MED ORDER — LISINOPRIL 10 MG PO TABS
20.0000 mg | ORAL_TABLET | Freq: Every day | ORAL | Status: DC
  Administered 2018-04-06: 20 mg via ORAL
  Filled 2018-04-05: qty 2

## 2018-04-05 MED ORDER — NITROGLYCERIN 0.4 MG SL SUBL
0.4000 mg | SUBLINGUAL_TABLET | SUBLINGUAL | Status: DC | PRN
  Administered 2018-04-05 – 2018-04-06 (×3): 0.4 mg via SUBLINGUAL
  Filled 2018-04-05 (×2): qty 1

## 2018-04-05 MED ORDER — ASPIRIN EC 81 MG PO TBEC
81.0000 mg | DELAYED_RELEASE_TABLET | Freq: Every day | ORAL | Status: DC
  Administered 2018-04-06: 81 mg via ORAL
  Filled 2018-04-05: qty 1

## 2018-04-05 MED ORDER — ENOXAPARIN SODIUM 60 MG/0.6ML ~~LOC~~ SOLN
60.0000 mg | SUBCUTANEOUS | Status: DC
  Administered 2018-04-05: 60 mg via SUBCUTANEOUS
  Filled 2018-04-05: qty 0.6

## 2018-04-05 MED ORDER — HYDROCHLOROTHIAZIDE 25 MG PO TABS
25.0000 mg | ORAL_TABLET | Freq: Every day | ORAL | Status: DC
  Administered 2018-04-06: 25 mg via ORAL
  Filled 2018-04-05: qty 1

## 2018-04-05 MED ORDER — METOPROLOL TARTRATE 50 MG PO TABS
50.0000 mg | ORAL_TABLET | Freq: Two times a day (BID) | ORAL | Status: DC
  Administered 2018-04-05 – 2018-04-06 (×2): 50 mg via ORAL
  Filled 2018-04-05 (×2): qty 1

## 2018-04-05 MED ORDER — ACETAMINOPHEN 325 MG PO TABS
650.0000 mg | ORAL_TABLET | Freq: Four times a day (QID) | ORAL | Status: DC | PRN
  Administered 2018-04-06: 650 mg via ORAL
  Filled 2018-04-05: qty 2

## 2018-04-05 MED ORDER — PANTOPRAZOLE SODIUM 40 MG PO TBEC
40.0000 mg | DELAYED_RELEASE_TABLET | Freq: Every day | ORAL | Status: DC
  Administered 2018-04-06: 40 mg via ORAL
  Filled 2018-04-05: qty 1

## 2018-04-05 MED ORDER — ONDANSETRON HCL 4 MG/2ML IJ SOLN: 4.0000 mg | Freq: Four times a day (QID) | INTRAMUSCULAR | Status: DC | PRN

## 2018-04-05 NOTE — ED Provider Notes (Signed)
[facility] EMERGENCY DEPARTMENT Provider Note   CSN: [id number] Arrival date & time: 04/05/18  1337     History   Chief Complaint Chief Complaint  Patient presents with  . Chest Pain    HPI [name] is a 68 y.o. female.  Patient was up on the fourth floor having pulmonary function test done when she developed increasing chest pain.  Patient states that she has been having chest pain on and off for days.  She thought it was due to her COPD.  She has not seen cardiology.  Patient's past medical history is significant for hypertension diabetes and bipolar disorder.  Patient denies any nausea or vomiting.  No worsening shortness of breath than usual.  Rapid response was called due to her developed lower chest pain.  I saw her briefly.  Patient was moved down to the emergency department for further evaluation.     Past Medical History:  Diagnosis Date  . Bipolar 1 disorder ([place])   . Cancer ([place])   . Diabetes mellitus without complication ([place])   . Hypertension     Patient Active Problem List   Diagnosis Date Noted  . Hypertension 11/30/2016  . Bipolar 1 disorder ([place]) 11/30/2016  . Diabetes mellitus without complication ([place]) 67/67/2094  . Closed fracture of left distal femur ([place]) 11/30/2016  . Other fracture of left femur, initial encounter for closed fracture ([place]) 11/29/2016  . Morbid obesity ([place]) 11/29/2016    Past Surgical History:  Procedure Laterality Date  . ABDOMINAL HYSTERECTOMY    . CHOLECYSTECTOMY    . ORIF FEMUR FRACTURE Left 11/30/2016   Procedure: OPEN REDUCTION INTERNAL FIXATION (ORIF) DISTAL FEMUR FRACTURE;  Surgeon: Rod Can, MD;  Location: [place];  Service: Orthopedics;  Laterality: Left;  . TONSILLECTOMY       OB History   No obstetric history on file.      Home Medications    Prior to Admission medications   Medication Sig Start Date End Date Taking? Authorizing Provider  acetaminophen (TYLENOL) 325 MG tablet Take 2 tablets  (650 mg total) by mouth every 6 (six) hours as needed for mild pain (or Fever >/= 101). 12/02/16  Yes Regalado, Belkys A, MD  albuterol (PROVENTIL HFA;VENTOLIN HFA) 108 (90 Base) MCG/ACT inhaler Inhale 2 puffs into the lungs every 8 (eight) hours as needed. 12/13/17  Yes [provider]  aspirin EC 81 MG tablet Take 81 mg by mouth daily.   Yes [provider]  citalopram (CELEXA) 40 MG tablet Take 40 mg by mouth daily.   Yes [provider]  hydrochlorothiazide (HYDRODIURIL) 25 MG tablet TAKE 1 TABLET BY MOUTH ONCE DAILY FOR BLOOD PRESSURE 11/06/17  Yes [provider]  hydrOXYzine (ATARAX/VISTARIL) 25 MG tablet Take 25 mg by mouth every 6 (six) hours as needed.   Yes [provider]  insulin detemir (LEVEMIR) 100 UNIT/ML injection Inject 24 Units into the skin at bedtime.   Yes [provider]  lisinopril (PRINIVIL,ZESTRIL) 20 MG tablet Take 20 mg by mouth daily.   Yes [provider]  lovastatin (MEVACOR) 20 MG tablet Take 20 mg by mouth at bedtime.   Yes [provider]  meclizine (ANTIVERT) 25 MG tablet Take 1 tablet by mouth 2 (two) times daily as needed. 03/16/18  Yes [provider]  metFORMIN (GLUCOPHAGE) 500 MG tablet Take by mouth 2 (two) times daily with a meal.   Yes [provider]  metoprolol tartrate (LOPRESSOR) 50 MG tablet  Take 50 mg by mouth 2 (two) times daily. 08/30/17  Yes [provider]  pantoprazole (PROTONIX) 40 MG tablet TAKE 1 TABLET BY MOUTH ONCE DAILY (STOP NEXIUM ESOMEPRAZOLE) 11/28/17  Yes [provider]  promethazine (PHENERGAN) 25 MG tablet TAKE 1 TABLET BY MOUTH 2 TO 3 TIMES DAILY AS NEEDED FOR NAUSEA FOR VOMITING 12/02/17  Yes [provider]  senna-docusate (SENOKOT-S) 8.6-50 MG tablet Take 1 tablet by mouth at bedtime. 12/03/16  Yes Regalado, Belkys A, MD  ALPRAZolam (XANAX) 0.5 MG tablet Take 1 tablet (0.5 mg total) by mouth daily. Patient not taking:  Reported on 04/05/2018 12/03/16   Regalado, Jerald Kief A, MD  amoxicillin (AMOXIL) 500 MG capsule Take 500 mg by mouth 2 (two) times daily. 11/16/17   [provider]  amoxicillin-clavulanate (AUGMENTIN) 875-125 MG tablet Take 1 tablet by mouth 2 (two) times daily. 02/08/18   [provider]  BYDUREON 2 MG PEN One injection once a week wed 10/18/17   [provider]    Family History History reviewed. No pertinent family history.  Social History Social History   Tobacco Use  . Smoking status: Former Research scientist (life sciences)  . Smokeless tobacco: Never Used  Substance Use Topics  . Alcohol use: No  . Drug use: No     Allergies   Latex and Sulfa antibiotics   Review of Systems Review of Systems  Constitutional: Negative for chills and fever.  HENT: Negative for congestion, rhinorrhea and sore throat.   Eyes: Negative for visual disturbance.  Respiratory: Positive for shortness of breath. Negative for cough and wheezing.   Cardiovascular: Positive for chest pain. Negative for leg swelling.  Gastrointestinal: Negative for abdominal pain, diarrhea, nausea and vomiting.  Genitourinary: Negative for dysuria.  Musculoskeletal: Negative for back pain and neck pain.  Skin: Negative for rash.  Neurological: Negative for dizziness, light-headedness and headaches.  Hematological: Does not bruise/bleed easily.  Psychiatric/Behavioral: Negative for confusion.     Physical Exam Updated Vital Signs BP 129/82   Pulse 94   Temp 98.8 F (37.1 C) (Oral)   Resp 18   Ht 1.626 m (5\' 4" )   Wt 119.3 kg   SpO2 94%   BMI 45.14 kg/m   Physical Exam Vitals signs and nursing note reviewed.  Constitutional:      General: She is not in acute distress.    Appearance: She is well-developed.  HENT:     Head: Normocephalic and atraumatic.     Nose: No congestion.     Mouth/Throat:     Mouth: Mucous membranes are moist.  Eyes:     Extraocular Movements: Extraocular movements intact.      Conjunctiva/sclera: Conjunctivae normal.     Pupils: Pupils are equal, round, and reactive to light.  Neck:     Musculoskeletal: Normal range of motion and neck supple.  Cardiovascular:     Rate and Rhythm: Normal rate and regular rhythm.     Heart sounds: No murmur.  Pulmonary:     Effort: Pulmonary effort is normal. No respiratory distress.     Breath sounds: Normal breath sounds. No wheezing.  Abdominal:     General: Bowel sounds are normal.     Palpations: Abdomen is soft.     Tenderness: There is no abdominal tenderness.  Musculoskeletal: Normal range of motion.  Skin:    General: Skin is warm and dry.  Neurological:     General: No focal deficit present.     Mental Status: She is  alert. Mental status is at baseline.      ED Treatments / Results  Labs (all labs ordered are listed, but only abnormal results are displayed) Labs Reviewed  COMPREHENSIVE METABOLIC PANEL - Abnormal; Notable for the following components:      Result Value   Glucose, Bld 137 (*)    All other components within normal limits  CBG MONITORING, ED - Abnormal; Notable for the following components:   Glucose-Capillary 137 (*)    All other components within normal limits  TROPONIN I  CBC WITH DIFFERENTIAL/PLATELET    EKG None  Radiology Dg Chest 2 View  Result Date: 04/05/2018 CLINICAL DATA:  Chest pain. EXAM: CHEST - 2 VIEW COMPARISON:  Radiographs of December 06, 2017. FINDINGS: The heart size and mediastinal contours are within normal limits. Both lungs are clear. Stable elevated right hemidiaphragm is noted. No pneumothorax or pleural effusion is noted. The visualized skeletal structures are unremarkable. IMPRESSION: No active cardiopulmonary disease. Electronically Signed   By: Marijo Conception, M.D.   On: 04/05/2018 14:52    Procedures Procedures (including critical care time)  Medications Ordered in ED Medications  nitroGLYCERIN (NITROSTAT) SL tablet 0.4 mg (0.4 mg Sublingual Given  04/05/18 1421)  aspirin chewable tablet 324 mg (324 mg Oral Given 04/05/18 1421)     Initial Impression / Assessment and Plan / ED Course  I have reviewed the triage vital signs and the nursing notes.  Pertinent labs & imaging results that were available during my care of the patient were reviewed by me and considered in my medical decision making (see chart for details).     Patient with onset of substernal chest pain while undergoing pulmonary function test.  Chest pains persisted.  Patient was brought down from up there here to the emergency department.  Patient given aspirin and nitroglycerin chest pain completely resolved.  Initial chest x-ray negative.  Initial troponin negative.  Patient felt symptoms were secondary to COPD.  But there is no wheezing.  Feel that this may be cardiac related chest pain.  Again she is chest pain-free.  Will discuss with hospitalist for admission for chest pain rule out.  Chest pain resolved completely with aspirin and sublingual nitroglycerin.  Final Clinical Impressions(s) / ED Diagnoses   Final diagnoses:  Precordial pain    ED Discharge Orders    None       Fredia Sorrow, MD 04/05/18 [phone]

## 2018-04-05 NOTE — H&P (Addendum)
History and Physical    [name] VWP:[id number] DOB: 12/16/1950 DOA: 04/05/2018  PCP: Lori Gravel, MD   Patient coming from: Home  I have personally briefly reviewed patient's old medical records in [place]  Chief Complaint: SOB, chest pain  HPI: [name] is a 68 y.o. female with medical history significant for HTN, DM2, obesity, COPD who was brought to the ED with complaints of difficulty breathing with chest pain.  Patient was here in the hospital getting a PFT test done, during the test she developed lower chest pain with difficulty breathing..  A rapid response was called.  She reports intermittent chest pain over the past several months with difficulty breathing, which lasts for a few hours and then resolve, not related to activity.  No known aggravating or relieving factors. She reports heart disease in her father, ?age.  Reports she quit smoking cigarettes about 25 years ago.  No recent trips, but reports intermittent leg swelling,  Without pain.  No family or personal history of blood clots. She is not on home O2.  ED Course: Stable vitals.  O2 sats 98% on room air.  Trop < 0.03.  Unremarkable CBC CMP.  Negative for acute abnormality.  EKG T wave change V2 only, doubt ST abnormalities.  At the time of my evaluation patient reported that she was still having some chest pain and difficulty breathing.  Patient given aspirin and nitro in the ED.  Review of Systems: As per HPI all other systems reviewed and negative.  Past Medical History:  Diagnosis Date  . Bipolar 1 disorder ([place])   . Cancer ([place])   . Diabetes mellitus without complication ([place])   . Hypertension     Past Surgical History:  Procedure Laterality Date  . ABDOMINAL HYSTERECTOMY    . CHOLECYSTECTOMY    . ORIF FEMUR FRACTURE Left 11/30/2016   Procedure: OPEN REDUCTION INTERNAL FIXATION (ORIF) DISTAL FEMUR FRACTURE;  Surgeon: Rod Can, MD;  Location: [facility];  Service: Orthopedics;   Laterality: Left;  . TONSILLECTOMY       reports that she has quit smoking. She has never used smokeless tobacco. She reports that she does not drink alcohol or use drugs.  Allergies  Allergen Reactions  . Latex Itching  . Sulfa Antibiotics    Family history of heart disease in her father.  Prior to Admission medications   Medication Sig Start Date End Date Taking? Authorizing Provider  acetaminophen (TYLENOL) 325 MG tablet Take 2 tablets (650 mg total) by mouth every 6 (six) hours as needed for mild pain (or Fever >/= 101). 12/02/16  Yes [name], Lori A, MD  albuterol (PROVENTIL HFA;VENTOLIN HFA) 108 (90 Base) MCG/ACT inhaler Inhale 2 puffs into the lungs every 8 (eight) hours as needed. 12/13/17  Yes [provider]  aspirin EC 81 MG tablet Take 81 mg by mouth daily.   Yes [provider]  citalopram (CELEXA) 40 MG tablet Take 40 mg by mouth daily.   Yes [provider]  hydrochlorothiazide (HYDRODIURIL) 25 MG tablet TAKE 1 TABLET BY MOUTH ONCE DAILY FOR BLOOD PRESSURE 11/06/17  Yes [provider]  hydrOXYzine (ATARAX/VISTARIL) 25 MG tablet Take 25 mg by mouth every 6 (six) hours as needed.   Yes [provider]  insulin detemir (LEVEMIR) 100 UNIT/ML injection Inject 24 Units into the skin at bedtime.   Yes [provider]  lisinopril (PRINIVIL,ZESTRIL) 20 MG tablet Take 20 mg by mouth daily.   Yes  [provider]  lovastatin (MEVACOR) 20 MG tablet Take 20 mg by mouth at bedtime.   Yes [provider]  meclizine (ANTIVERT) 25 MG tablet Take 1 tablet by mouth 2 (two) times daily as needed. 03/16/18  Yes [provider]  metFORMIN (GLUCOPHAGE) 500 MG tablet Take by mouth 2 (two) times daily with a meal.   Yes [provider]  metoprolol tartrate (LOPRESSOR) 50 MG tablet Take 50 mg by mouth 2 (two) times daily. 08/30/17  Yes [provider]  pantoprazole (PROTONIX) 40 MG tablet TAKE 1 TABLET  BY MOUTH ONCE DAILY (STOP NEXIUM ESOMEPRAZOLE) 11/28/17  Yes [provider]  promethazine (PHENERGAN) 25 MG tablet TAKE 1 TABLET BY MOUTH 2 TO 3 TIMES DAILY AS NEEDED FOR NAUSEA FOR VOMITING 12/02/17  Yes [provider]  senna-docusate (SENOKOT-S) 8.6-50 MG tablet Take 1 tablet by mouth at bedtime. 12/03/16  Yes [name], Lori A, MD  ALPRAZolam (XANAX) 0.5 MG tablet Take 1 tablet (0.5 mg total) by mouth daily. Patient not taking: Reported on 04/05/2018 12/03/16   [name], Lori Kief A, MD  amoxicillin (AMOXIL) 500 MG capsule Take 500 mg by mouth 2 (two) times daily. 11/16/17   [provider]  amoxicillin-clavulanate (AUGMENTIN) 875-125 MG tablet Take 1 tablet by mouth 2 (two) times daily. 02/08/18   [provider]  BYDUREON 2 MG PEN One injection once a week wed 10/18/17   [provider]    Physical Exam: Vitals:   04/05/18 1338 04/05/18 1400 04/05/18 1430 04/05/18 1500  BP:  (!) 150/77 129/82 (!) 142/77  Pulse:  78 94 77  Resp:  17 18 17   Temp:      TempSrc:      SpO2:  97% 94% 94%  Weight: 119.3 kg     Height: 5\' 4"  (1.626 m)       Constitutional: NAD, calm, comfortable, obese Vitals:   04/05/18 1338 04/05/18 1400 04/05/18 1430 04/05/18 1500  BP:  (!) 150/77 129/82 (!) 142/77  Pulse:  78 94 77  Resp:  17 18 17   Temp:      TempSrc:      SpO2:  97% 94% 94%  Weight: 119.3 kg     Height: 5\' 4"  (1.626 m)      Eyes: PERRL, lids and conjunctivae normal ENMT: Mucous membranes are moist. Posterior pharynx clear of any exudate or lesions. Neck: normal, supple, no masses, no thyromegaly Respiratory: clear to auscultation bilaterally, no wheezing, no crackles. Normal respiratory effort. No accessory muscle use.  Cardiovascular: Regular rate and rhythm, no murmurs / rubs / gallops. No extremity edema. 2+ pedal pulses. No carotid bruits.  Abdomen: no tenderness, no masses palpated. No hepatosplenomegaly. Bowel sounds positive.    Musculoskeletal: no clubbing / cyanosis. No joint deformity upper and lower extremities. Good ROM, no contractures. Normal muscle tone.  Skin: no rashes, lesions, ulcers. No induration Neurologic: CN 2-12 grossly intact. Sensation intact, DTR normal. Strength 5/5 in all 4.  Psychiatric: Normal judgment and insight. Alert and oriented x 3. Normal mood.   Labs on Admission: I have personally reviewed following labs and imaging studies  CBC: Recent Labs  Lab 04/05/18 1419  WBC 9.6  NEUTROABS 6.1  HGB 14.5  HCT 43.9  MCV 89.2  PLT 597   Basic Metabolic Panel: Recent Labs  Lab 04/05/18 1419  NA 138  K 4.0  CL 102  CO2 24  GLUCOSE 137*  BUN 18  CREATININE 0.86  CALCIUM 9.5  Liver Function Tests: Recent Labs  Lab 04/05/18 1419  AST 25  ALT 23  ALKPHOS 66  BILITOT 0.7  PROT 7.5  ALBUMIN 4.2   Cardiac Enzymes: Recent Labs  Lab 04/05/18 1419  TROPONINI <0.03   CBG: Recent Labs  Lab 04/05/18 1332  GLUCAP 137*   Urine analysis:    Component Value Date/Time   COLORURINE YELLOW 03/29/2010 0931   APPEARANCEUR HAZY (A) 03/29/2010 0931   LABSPEC 1.010 03/29/2010 0931   PHURINE 6.0 03/29/2010 0931   GLUCOSEU NEGATIVE 01/12/2008 1642   HGBUR MODERATE (A) 03/29/2010 0931   BILIRUBINUR NEGATIVE 03/29/2010 0931   KETONESUR NEGATIVE 03/29/2010 0931   PROTEINUR NEGATIVE 03/29/2010 0931   UROBILINOGEN 0.2 03/29/2010 0931   NITRITE NEGATIVE 03/29/2010 0931   LEUKOCYTESUR NEGATIVE 03/29/2010 0931    Radiological Exams on Admission: Dg Chest 2 View  Result Date: 04/05/2018 CLINICAL DATA:  Chest pain. EXAM: CHEST - 2 VIEW COMPARISON:  Radiographs of December 06, 2017. FINDINGS: The heart size and mediastinal contours are within normal limits. Both lungs are clear. Stable elevated right hemidiaphragm is noted. No pneumothorax or pleural effusion is noted. The visualized skeletal structures are unremarkable. IMPRESSION: No active cardiopulmonary disease. Electronically  Signed   By: Marijo Conception, M.D.   On: 04/05/2018 14:52    EKG: Independently reviewed. read as accelerated junctional rhythm but I do see some P waves, with regular rate 75.  Flipped T waves in lead V2 only.  No ST abnormalities noted.  Assessment/Plan Active Problems:   Chest pain   Chest pain-atypical.  But with risk factors for CAD-obesity, hypertension, diabetes.  EKG with T wave changes in only lead V2.  Low risk for thromboembolism. -Trend Troponin -EKG a.m. -Echocardiogram -Cardiology consult in a.m, order placed -P.o. midnight -D-dimer- normal at 0.45. -Lipid Panel a.m. -Continue home aspirin, statins, metoprolol  DM-glucose random 137. - SSI -Continue home Lantus at reduced dose 12 units daily  HTN-stable -Continue home HCTZ, lisinopril, metoprolol  COPD-Stable -F/u outpatient to complete PFTs  Bipolar disorder -Cont home Celexa.  DVT prophylaxis: Lovenox Code Status: Full Family Communication: None at bedside Disposition Plan: 1-2 days Consults called: Cardiology Admission status: Obs, tele   Bethena Roys MD [facility]  04/05/2018, 5:53 PM

## 2018-04-05 NOTE — Progress Notes (Signed)
Patient in today for Pulmonary Function testing.  Testing not completed because Patient C/O chest pain after 2nd SVC maneuver. Patient RR 20-24, HR 80-86, SaO2 on room air 96-98%.  Rapid response team called and  evaluated patient. Patient taken to ED for further evaluation

## 2018-04-05 NOTE — ED Triage Notes (Signed)
Chest pain for one hourprior to pulmonary function, became short as breath.  Sats at 98%, heart rate remained in the 80's.

## 2018-04-06 ENCOUNTER — Observation Stay (HOSPITAL_BASED_OUTPATIENT_CLINIC_OR_DEPARTMENT_OTHER): Payer: Medicare HMO

## 2018-04-06 ENCOUNTER — Encounter (HOSPITAL_COMMUNITY): Payer: Self-pay | Admitting: Student

## 2018-04-06 DIAGNOSIS — R072 Precordial pain: Secondary | ICD-10-CM

## 2018-04-06 DIAGNOSIS — E119 Type 2 diabetes mellitus without complications: Secondary | ICD-10-CM | POA: Diagnosis not present

## 2018-04-06 DIAGNOSIS — R0789 Other chest pain: Secondary | ICD-10-CM

## 2018-04-06 DIAGNOSIS — Z7982 Long term (current) use of aspirin: Secondary | ICD-10-CM | POA: Diagnosis not present

## 2018-04-06 DIAGNOSIS — I10 Essential (primary) hypertension: Secondary | ICD-10-CM | POA: Diagnosis not present

## 2018-04-06 DIAGNOSIS — R079 Chest pain, unspecified: Secondary | ICD-10-CM | POA: Diagnosis not present

## 2018-04-06 LAB — GLUCOSE, CAPILLARY
Glucose-Capillary: 118 mg/dL — ABNORMAL HIGH (ref 70–99)
Glucose-Capillary: 135 mg/dL — ABNORMAL HIGH (ref 70–99)
Glucose-Capillary: 137 mg/dL — ABNORMAL HIGH (ref 70–99)

## 2018-04-06 LAB — LIPID PANEL
Cholesterol: 165 mg/dL (ref 0–200)
HDL: 44 mg/dL (ref >40)
LDL Cholesterol: 54 mg/dL (ref 0–99)
Total CHOL/HDL Ratio: 3.8 RATIO
Triglycerides: 333 mg/dL — ABNORMAL HIGH (ref <150)
VLDL: 67 mg/dL — ABNORMAL HIGH (ref 0–40)

## 2018-04-06 LAB — ECHOCARDIOGRAM COMPLETE
Height: 64 in
Weight: 4352.76 oz

## 2018-04-06 LAB — HIV ANTIBODY (ROUTINE TESTING W REFLEX): HIV SCREEN 4TH GENERATION: NONREACTIVE

## 2018-04-06 LAB — TROPONIN I: Troponin I: <0.03 ng/mL (ref <0.03)

## 2018-04-06 MED ORDER — NITROGLYCERIN 0.4 MG SL SUBL: 0.4000 mg | SUBLINGUAL_TABLET | SUBLINGUAL | 12 refills | Status: DC | PRN

## 2018-04-06 NOTE — Discharge Instructions (Signed)
° °  You have a Stress Test scheduled at [facility]. Your doctor has ordered this test to check the blood flow in your heart arteries.  Please arrive 15 minutes early for paperwork. The whole test will take several hours. You may want to bring reading material to remain occupied while undergoing different parts of the test.  Instructions:  No food/drink after midnight the night before.  It is OK to take your morning meds with a sip of water EXCEPT for those types of medicines listed below or otherwise instructed.  No caffeine/decaf products 24 hours before, including medicines such as Excedrin or Goody Powders. Call if there are any questions.   Wear comfortable clothes and shoes.   Special Medication Instructions:  Beta blockers such as metoprolol (Lopressor/Toprol XL), atenolol (Tenormin), carvedilol (Coreg), nebivolol (Bystolic), bisoprolol (Zebeta), propranolol (Inderal) should not be taken for 24 hours before the test.  Calcium channel blockers such as diltiazem (Cardizem) or verapmil ([place]) should not be taken for 24 hours before the test.  Remove nitroglycerin patches and do not take nitrate preparations such as Imdur/isosorbide the day of your test.  No Persantine/Theophylline or Aggrenox medicines should be used within 24 hours of the test.   If you are diabetic, please ask which medications to hold the day of the test.  What To Expect: When you arrive in the lab, the technician will inject a small amount of radioactive tracer into your arm through an IV while you are resting quietly. This helps [place] to form pictures of your heart. You will likely only feel a sting from the IV. After a waiting period, resting pictures will be obtained under a big camera. These are the "before" pictures.  Next, you will be prepped for the stress portion of the test. This may include either walking on a treadmill or receiving a medicine that helps to dilate blood vessels in your heart to  simulate the effect of exercise on your heart. If you are walking on a treadmill, you will walk at different paces to try to get your heart rate to a goal number that is based on your age. If your doctor has chosen the pharmacologic test, then you will receive a medicine through your IV that may cause temporary nausea, flushing, shortness of breath and sometimes chest discomfort or vomiting. This is typically short-lived and usually resolves quickly. If you experience symptoms, that does not automatically mean the test is abnormal. Some patients do not experience any symptoms at all. Your blood pressure and heart rate will be monitored, and we will be watching your EKG on a computer screen for any changes. During this portion of the test, the radiologist will inject another small amount of radioactive tracer into your IV. After a waiting period, you will undergo a second set of pictures. These are the "after" pictures.  The doctor reading the test will compare the before-and-after images to look for evidence of heart blockages or heart weakness. The test usually takes 1 day to complete, but in certain instances (for example, if a patient is over a certain weight limit), the test may be done over the span of 2 days.

## 2018-04-06 NOTE — Consult Note (Addendum)
Cardiology Consult    Patient ID: [name]; [id number]; 10-19-1950   Admit date: 04/05/2018 Date of Consult: 04/06/2018  Primary Care Provider: [name], MD Primary Cardiologist: [name]  Patient Profile    [name] is [name] 68 y.o. female with past medical history of HTN, HLD, Type 2 DM, and Bipolar disorder who is being seen today for the evaluation of chest pain at the request of [name].   History of Present Illness    [name] presented to [name] on 04/05/2018 after developing chest pain while undergoing pulmonary function testing. In talking with the patient today, she reports having episodes of chest pain for the past 6+ months. She describes this as [name] tightness along her entire precordial region which [name] last for hours at [name] time. Is not associated with exertion or positional changes. She reports having baseline dyspnea on exertion in the setting of COPD but denies any specific orthopnea, PND, or lower extremity edema. No recent palpitations or presyncope. Did report dizziness while undergoing PFT's yesterday.   She denies ever having undergone [name] cardiac catheterization or stent placement but was told she "had [name] heart attack in the past" without any cardiac testing performed at that time. She does have [name] family history of CAD with both her father and brother having died of an MI in their 73's.  Initial labs showed WBC 9.6, Hgb 14.5, platelets 325, Na+ 138, K+ 4.0, and creatinine 0.86. Initial and cyclic troponin values have been negative. FLP shows total cholesterol 165, triglycerides 333, HDL 44, and LDL 54. CXR showed no active cardiopulmonary disease. EKG showed NSR, HR 75, with low voltage along precordial leads. No acute ST changes when compared to prior tracings.   Past Medical History:  Diagnosis Date  . Bipolar 1 disorder ([name])   . Cancer ([name])   . Diabetes mellitus without complication ([name])   . Hypertension     Past  Surgical History:  Procedure Laterality Date  . ABDOMINAL HYSTERECTOMY    . CHOLECYSTECTOMY    . ORIF FEMUR FRACTURE Left 11/30/2016   Procedure: OPEN REDUCTION INTERNAL FIXATION (ORIF) DISTAL FEMUR FRACTURE;  Surgeon: Lori Can, MD;  Location: [facility];  Service: Orthopedics;  Laterality: Left;  . TONSILLECTOMY       Home Medications:  Prior to Admission medications   Medication Sig Start Date End Date Taking? Authorizing Provider  acetaminophen (TYLENOL) 325 MG tablet Take 2 tablets (650 mg total) by mouth every 6 (six) hours as needed for mild pain (or Fever >/= 101). 12/02/16  Yes Regalado, Belkys A, MD  albuterol (PROVENTIL HFA;VENTOLIN HFA) 108 (90 Base) MCG/ACT inhaler Inhale 2 puffs into the lungs every 8 (eight) hours as needed. 12/13/17  Yes [provider]  aspirin EC 81 MG tablet Take 81 mg by mouth daily.   Yes [provider]  citalopram (CELEXA) 40 MG tablet Take 40 mg by mouth daily.   Yes [provider]  hydrochlorothiazide (HYDRODIURIL) 25 MG tablet TAKE 1 TABLET BY MOUTH ONCE DAILY FOR BLOOD PRESSURE 11/06/17  Yes [provider]  hydrOXYzine (ATARAX/VISTARIL) 25 MG tablet Take 25 mg by mouth every 6 (six) hours as needed.   Yes [provider]  insulin detemir (LEVEMIR) 100 UNIT/ML injection Inject 24 Units into the skin at bedtime.   Yes [provider]  lisinopril (PRINIVIL,ZESTRIL) 20 MG tablet Take 20 mg by mouth daily.   Yes [provider]  lovastatin (MEVACOR) 20 MG tablet Take 20 mg by mouth at bedtime.   Yes [provider]  meclizine (ANTIVERT) 25 MG tablet Take 1 tablet by mouth 2 (two) times daily as needed. 03/16/18  Yes [provider]  metFORMIN (GLUCOPHAGE) 500 MG tablet Take by mouth 2 (two) times daily with [name] meal.   Yes [provider]  metoprolol tartrate (LOPRESSOR) 50 MG tablet Take 50 mg by mouth 2 (two) times daily. 08/30/17  Yes [provider]    pantoprazole (PROTONIX) 40 MG tablet TAKE 1 TABLET BY MOUTH ONCE DAILY (STOP NEXIUM ESOMEPRAZOLE) 11/28/17  Yes [provider]  promethazine (PHENERGAN) 25 MG tablet TAKE 1 TABLET BY MOUTH 2 TO 3 TIMES DAILY AS NEEDED FOR NAUSEA FOR VOMITING 12/02/17  Yes [provider]  senna-docusate (SENOKOT-S) 8.6-50 MG tablet Take 1 tablet by mouth at bedtime. 12/03/16  Yes Regalado, Belkys A, MD  ALPRAZolam (XANAX) 0.5 MG tablet Take 1 tablet (0.5 mg total) by mouth daily. Patient not taking: Reported on 04/05/2018 12/03/16   Elmarie Shiley, MD  BYDUREON 2 MG PEN One injection once [name] week wed 10/18/17   [provider]    Inpatient Medications: Scheduled Meds: . aspirin EC  81 mg Oral Daily  . citalopram  40 mg Oral Daily  . enoxaparin (LOVENOX) injection  60 mg Subcutaneous Q24H  . hydrochlorothiazide  25 mg Oral Daily  . insulin aspart  0-9 Units Subcutaneous Q6H  . insulin detemir  12 Units Subcutaneous QHS  . lisinopril  20 mg Oral Daily  . metoprolol tartrate  50 mg Oral BID  . pantoprazole  40 mg Oral Daily  . pravastatin  20 mg Oral q1800   Continuous Infusions:  PRN Meds: acetaminophen **OR** acetaminophen, nitroGLYCERIN, ondansetron **OR** ondansetron (ZOFRAN) IV, polyethylene glycol  Allergies:    Allergies  Allergen Reactions  . Latex Itching  . Sulfa Antibiotics     Social History:   Social History   Socioeconomic History  . Marital status: Married    Spouse name: Not on file  . Number of children: Not on file  . Years of education: Not on file  . Highest education level: Not on file  Occupational History  . Not on file  Social Needs  . Financial resource strain: Not on file  . Food insecurity:    Worry: Not on file    Inability: Not on file  . Transportation needs:    Medical: Not on file    Non-medical: Not on file  Tobacco Use  . Smoking status: Former Research scientist (life sciences)  . Smokeless tobacco: Never Used  Substance and Sexual Activity  .  Alcohol use: No  . Drug use: No  . Sexual activity: Not on file  Lifestyle  . Physical activity:    Days per week: Not on file    Minutes per session: Not on file  . Stress: Not on file  Relationships  . Social connections:    Talks on phone: Not on file    Gets together: Not on file    Attends religious service: Not on file    Active member of club or organization: Not on file    Attends meetings of clubs or organizations: Not on file    Relationship status: Not on file  . Intimate partner violence:    Fear of current or ex partner: Not on file    Emotionally abused: Not on file    Physically abused: Not  on file    Forced sexual activity: Not on file  Other Topics Concern  . Not on file  Social History Narrative  . Not on file     Family History:    Family History  Problem Relation Age of Onset  . CAD Father   . CAD Brother       Review of Systems    General:  No chills, fever, night sweats or weight changes.  Cardiovascular:  No edema, orthopnea, palpitations, paroxysmal nocturnal dyspnea. Positive for chest pain and dyspnea on exertion.  Dermatological: No rash, lesions/masses Respiratory: No cough, dyspnea Urologic: No hematuria, dysuria Abdominal:   No nausea, vomiting, diarrhea, bright red blood per rectum, melena, or hematemesis Neurologic:  No visual changes, wkns, changes in mental status. All other systems reviewed and are otherwise negative except as noted above.  Physical Exam/Data    Vitals:   04/06/18 0219 04/06/18 0227 04/06/18 0235 04/06/18 0557  BP: (!) 122/52 (!) 104/49 (!) 113/57 (!) 147/66  Pulse: 79 88 85 72  Resp: (!) 22   20  Temp:    98.5 F (36.9 C)  TempSrc:    Oral  SpO2: 94% 93% 91% 92%  Weight:      Height:        Intake/Output Summary (Last 24 hours) at 04/06/2018 0810 Last data filed at 04/05/2018 2059 Gross per 24 hour  Intake 240 ml  Output 200 ml  Net 40 ml   Filed Weights   04/05/18 1338 04/05/18 1738  Weight:  119.3 kg 123.4 kg   Body mass index is 46.7 kg/m.   General: Pleasant, obese Caucasian female appearing in NAD Psych: Normal affect. Neuro: Alert and oriented X 3. Moves all extremities spontaneously. HEENT: Normal  Neck: Supple without bruits or JVD. Lungs:  Resp regular and unlabored, no rales appreciated. Mild expiratory wheeze along upper lung fields bilaterally. Heart: RRR no s3, s4, or murmurs. Abdomen: Soft, non-tender, non-distended, BS + x 4.  Extremities: No clubbing or cyanosis. Trace ankle edema. DP/PT/Radials 2+ and equal bilaterally.   EKG:  The EKG was personally reviewed and demonstrates: NSR, HR 75, with low voltage along precordial leads. No acute ST changes when compared to prior tracings.  Telemetry:  Telemetry was personally reviewed and demonstrates: NSR, HR in 60's to 80's. One strip labeled as please review for concern of NSVT but her QRS complexes march out and appears most consistent with artifact.    Labs/Studies     Relevant CV Studies:  Echocardiogram: Pending  Laboratory Data:  Chemistry Recent Labs  Lab 04/05/18 1419  NA 138  K 4.0  CL 102  CO2 24  GLUCOSE 137*  BUN 18  CREATININE 0.86  CALCIUM 9.5  GFRNONAA >60  GFRAA >60  ANIONGAP 12    Recent Labs  Lab 04/05/18 1419  PROT 7.5  ALBUMIN 4.2  AST 25  ALT 23  ALKPHOS 66  BILITOT 0.7   Hematology Recent Labs  Lab 04/05/18 1419  WBC 9.6  RBC 4.92  HGB 14.5  HCT 43.9  MCV 89.2  MCH 29.5  MCHC 33.0  RDW 12.7  PLT 325   Cardiac Enzymes Recent Labs  Lab 04/05/18 1419 04/05/18 2002 04/06/18 0239  TROPONINI <0.03 <0.03 <0.03   No results for input(s): TROPIPOC in the last 168 hours.  BNPNo results for input(s): BNP, PROBNP in the last 168 hours.  DDimer  Recent Labs  Lab 04/05/18 1419  DDIMER 0.45    Radiology/Studies:  Dg Chest 2 View  Result Date: 04/05/2018 CLINICAL DATA:  Chest pain. EXAM: CHEST - 2 VIEW COMPARISON:  Radiographs of December 06, 2017.  FINDINGS: The heart size and mediastinal contours are within normal limits. Both lungs are clear. Stable elevated right hemidiaphragm is noted. No pneumothorax or pleural effusion is noted. The visualized skeletal structures are unremarkable. IMPRESSION: No active cardiopulmonary disease. Electronically Signed   By: Marijo Conception, M.D.   On: 04/05/2018 14:52     Assessment & Plan    1. Atypical Chest Pain - She reports having episodes of chest pain for the past 6+ months which she describes as [name] tightness along her entire precordial region and [name] last for hours at [name] time. Is not associated with exertion or positional changes. - Initial and cyclic troponin values have been negative. EKG shows NSR, HR 75, with low voltage along precordial leads. No acute ST changes when compared to prior tracings. An echocardiogram is pending to assess for any structural abnormalities. If echocardiogram is reassuring, would anticipate outpatient stress testing given her multiple cardiac risk factors including HTN, HLD, Type 2 DM, and family history of CAD. Would need Albuterol available as she does have COPD (had mild expiratory wheeze on examination today which improved with coughing). If echocardiogram shows abnormalities, would anticipate [name] for definitive evaluation.  - continue ASA, BB, and statin therapy.   2. HTN - BP has been variable at 104/59 - 161/95 since admission. -She has been continued on PTA Lisinopril 20 mg daily, Lopressor 50 mg twice daily, and HCTZ 25 mg daily.  3. HLD - FLP shows total cholesterol 165, triglycerides 333, HDL 44, and LDL 54. On Lovastatin 20mg  daily as an outpatient.   4. Type 2 DM - PTA Metformin held with her being continued on Levemir and SSI.   5. COPD - uses PRN Albuterol as an outpatient.   For questions or updates, please contact [place] Please consult [email or web address] for contact info under Cardiology/STEMI.  Signed, Erma Heritage,  PA-C 04/06/2018, 8:10 AM Pager: [phone]  Attending note Patient seen and discussed with PA Ahmed Prima, I agree with her documentation above. History of DM2, HTN, COPD admitted with SOB and chest pain. Ongoing over 6 months, [name] last [name] few hours at at time. Onset yesterday triggered during PFTs.    Ddimer 0.45 WBC 9.6 Hgb 14.5 Plt 325 K 4 Cr 0.86  Trop neg-->neg-->neg CXR no acute process EKG SR without ischemic changes Echo prelim review shows normal LV function, no WMAs   68 yo female with multiple CAD risk factors presents with atypical chest pain. No objective evidence of ischemia by EKG, enzymes. Prelim echo read shows normal function without WMAs. Would plan for outpatient dobutamine nuclear stress test 2 day protocol at outpatient, ok for discharge today.   [name] MD

## 2018-04-06 NOTE — Progress Notes (Signed)
DC instructions given to pt w/ med changes, appointment FU, activity level and diet. All questions answered. No distress noted. IV discontinued. Transported to car via [place].

## 2018-04-06 NOTE — Discharge Summary (Signed)
Physician Discharge Summary  [name] WUJ:[id number] DOB: Aug 26, 1950 DOA: 04/05/2018  PCP: [name], MD  Admit date: 04/05/2018  Discharge date: 04/06/2018  Admitted From:Home  Disposition:  Home  Recommendations for Outpatient Follow-up:  1. Follow up with PCP in 1-2 weeks 2. Follow-up with cardiology as recommended for outpatient stress test 3. Continue on aspirin, statin, and beta-blocker 4. Could try over-the-counter ibuprofen and Tylenol for chest pain as well as sublingual nitroglycerin that has been given  Home Health: None  Equipment/Devices: None  Discharge Condition: Stable  CODE STATUS: Full  Diet recommendation: Heart Healthy/carb modified  Brief/Interim Summary: Per HPI: [name] is a 68 y.o. female with medical history significant for HTN, DM2, obesity, COPD who was brought to the ED with complaints of difficulty breathing with chest pain.  Patient was here in the hospital getting a PFT test done, during the test she developed lower chest pain with difficulty breathing..  A rapid response was called.  She reports intermittent chest pain over the past several months with difficulty breathing, which lasts for a few hours and then resolve, not related to activity.  No known aggravating or relieving factors. She reports heart disease in her father, ?age.  Reports she quit smoking cigarettes about 25 years ago.  No recent trips, but reports intermittent leg swelling,  Without pain.  No family or personal history of blood clots. She is not on home O2.  Patient was admitted for atypical chest pain evaluation and was found to have negative cardiac enzymes or EKG findings.  There were no signs of ACS otherwise noted and cardiology had seen patient with 2D echocardiogram performed with no wall motion abnormalities and preserved ejection fraction noted.  She is to continue on her usual home medications and will follow-up for outpatient stress testing.  No other acute  events noted during this course of admission.  Discharge Diagnoses:  Active Problems:   Chest pain   Principal discharge diagnosis: Atypical chest pain noncardiogenic. Discharge Instructions  Discharge Instructions    Diet - low sodium heart healthy   Complete by:  As directed    Increase activity slowly   Complete by:  As directed      Allergies as of 04/06/2018      Reactions   Latex Itching   Sulfa Antibiotics       Medication List    TAKE these medications   acetaminophen 325 MG tablet Commonly known as:  TYLENOL Take 2 tablets (650 mg total) by mouth every 6 (six) hours as needed for mild pain (or Fever >/= 101).   albuterol 108 (90 Base) MCG/ACT inhaler Commonly known as:  PROVENTIL HFA;VENTOLIN HFA Inhale 2 puffs into the lungs every 8 (eight) hours as needed.   ALPRAZolam 0.5 MG tablet Commonly known as:  XANAX Take 1 tablet (0.5 mg total) by mouth daily.   aspirin EC 81 MG tablet Take 81 mg by mouth daily.   BYDUREON 2 MG Pen Generic drug:  Exenatide ER One injection once a week wed   citalopram 40 MG tablet Commonly known as:  CELEXA Take 40 mg by mouth daily.   hydrochlorothiazide 25 MG tablet Commonly known as:  HYDRODIURIL TAKE 1 TABLET BY MOUTH ONCE DAILY FOR BLOOD PRESSURE   hydrOXYzine 25 MG tablet Commonly known as:  ATARAX/VISTARIL Take 25 mg by mouth every 6 (six) hours as needed.   insulin detemir 100 UNIT/ML injection Commonly known as:  LEVEMIR Inject 24 Units into the  skin at bedtime.   lisinopril 20 MG tablet Commonly known as:  PRINIVIL,ZESTRIL Take 20 mg by mouth daily.   lovastatin 20 MG tablet Commonly known as:  MEVACOR Take 20 mg by mouth at bedtime.   meclizine 25 MG tablet Commonly known as:  ANTIVERT Take 1 tablet by mouth 2 (two) times daily as needed.   metFORMIN 500 MG tablet Commonly known as:  GLUCOPHAGE Take by mouth 2 (two) times daily with a meal.   metoprolol tartrate 50 MG tablet Commonly known as:   LOPRESSOR Take 50 mg by mouth 2 (two) times daily.   nitroGLYCERIN 0.4 MG SL tablet Commonly known as:  NITROSTAT Place 1 tablet (0.4 mg total) under the tongue every 5 (five) minutes x 3 doses as needed for chest pain.   pantoprazole 40 MG tablet Commonly known as:  PROTONIX TAKE 1 TABLET BY MOUTH ONCE DAILY (STOP NEXIUM ESOMEPRAZOLE)   promethazine 25 MG tablet Commonly known as:  PHENERGAN TAKE 1 TABLET BY MOUTH 2 TO 3 TIMES DAILY AS NEEDED FOR NAUSEA FOR VOMITING   senna-docusate 8.6-50 MG tablet Commonly known as:  Senokot-S Take 1 tablet by mouth at bedtime.      Follow-up Information    Branch, Alphonse Guild, MD Follow up.   Specialty:  Cardiology Why:  The office will contact you to arrange for your outpatient stress test.  Contact information: [address] [place] [address] [phone]        [name], MD Follow up in 1 week(s).   Specialty:  Internal Medicine Contact information: [place] [address] [phone]          Allergies  Allergen Reactions  . Latex Itching  . Sulfa Antibiotics     Consultations:  Cardiology   Procedures/Studies: Dg Chest 2 View  Result Date: 04/05/2018 CLINICAL DATA:  Chest pain. EXAM: CHEST - 2 VIEW COMPARISON:  Radiographs of December 06, 2017. FINDINGS: The heart size and mediastinal contours are within normal limits. Both lungs are clear. Stable elevated right hemidiaphragm is noted. No pneumothorax or pleural effusion is noted. The visualized skeletal structures are unremarkable. IMPRESSION: No active cardiopulmonary disease. Electronically Signed   By: Marijo Conception, M.D.   On: 04/05/2018 14:52     Discharge Exam: Vitals:   04/06/18 0235 04/06/18 0557  BP: (!) 113/57 (!) 147/66  Pulse: 85 72  Resp:  20  Temp:  98.5 F (36.9 C)  SpO2: 91% 92%   Vitals:   04/06/18 0219 04/06/18 0227 04/06/18 0235 04/06/18 0557  BP: (!) 122/52 (!) 104/49 (!) 113/57 (!) 147/66   Pulse: 79 88 85 72  Resp: (!) 22   20  Temp:    98.5 F (36.9 C)  TempSrc:    Oral  SpO2: 94% 93% 91% 92%  Weight:      Height:        General: Pt is alert, awake, not in acute distress Cardiovascular: RRR, S1/S2 +, no rubs, no gallops Respiratory: CTA bilaterally, no wheezing, no rhonchi Abdominal: Soft, NT, ND, bowel sounds + Extremities: no edema, no cyanosis    The results of significant diagnostics from this hospitalization (including imaging, microbiology, ancillary and laboratory) are listed below for reference.     Microbiology: No results found for this or any previous visit (from the past 240 hour(s)).   Labs: BNP (last 3 results) Recent Labs    12/06/17 1320  BNP 54.2   Basic Metabolic  Panel: Recent Labs  Lab 04/05/18 1419  NA 138  K 4.0  CL 102  CO2 24  GLUCOSE 137*  BUN 18  CREATININE 0.86  CALCIUM 9.5   Liver Function Tests: Recent Labs  Lab 04/05/18 1419  AST 25  ALT 23  ALKPHOS 66  BILITOT 0.7  PROT 7.5  ALBUMIN 4.2   No results for input(s): LIPASE, AMYLASE in the last 168 hours. No results for input(s): AMMONIA in the last 168 hours. CBC: Recent Labs  Lab 04/05/18 1419  WBC 9.6  NEUTROABS 6.1  HGB 14.5  HCT 43.9  MCV 89.2  PLT 325   Cardiac Enzymes: Recent Labs  Lab 04/05/18 1419 04/05/18 2002 04/06/18 0239  TROPONINI <0.03 <0.03 <0.03   BNP: Invalid input(s): POCBNP CBG: Recent Labs  Lab 04/05/18 1332 04/05/18 1755 04/05/18 2144 04/06/18 0003 04/06/18 0600  GLUCAP 137* 97 103* 118* 135*   D-Dimer Recent Labs    04/05/18 1419  DDIMER 0.45   Hgb A1c No results for input(s): HGBA1C in the last 72 hours. Lipid Profile Recent Labs    04/06/18 0239  CHOL 165  HDL 44  LDLCALC 54  TRIG 333*  CHOLHDL 3.8   Thyroid function studies No results for input(s): TSH, T4TOTAL, T3FREE, THYROIDAB in the last 72 hours.  Invalid input(s): FREET3 Anemia work up No results for input(s): VITAMINB12, FOLATE,  FERRITIN, TIBC, IRON, RETICCTPCT in the last 72 hours. Urinalysis    Component Value Date/Time   COLORURINE YELLOW 03/29/2010 0931   APPEARANCEUR HAZY (A) 03/29/2010 0931   LABSPEC 1.010 03/29/2010 0931   PHURINE 6.0 03/29/2010 0931   GLUCOSEU NEGATIVE 01/12/2008 1642   HGBUR MODERATE (A) 03/29/2010 0931   BILIRUBINUR NEGATIVE 03/29/2010 0931   KETONESUR NEGATIVE 03/29/2010 0931   PROTEINUR NEGATIVE 03/29/2010 0931   UROBILINOGEN 0.2 03/29/2010 0931   NITRITE NEGATIVE 03/29/2010 0931   LEUKOCYTESUR NEGATIVE 03/29/2010 0931   Sepsis Labs Invalid input(s): PROCALCITONIN,  WBC,  LACTICIDVEN Microbiology No results found for this or any previous visit (from the past 240 hour(s)).   Time coordinating discharge: 35 minutes  SIGNED:   Rodena Goldmann, DO [facility] 04/06/2018, 10:41 AM  If 7PM-7AM, please contact night-coverage [email or web address] Password [email or web address]

## 2018-04-06 NOTE — Progress Notes (Signed)
Patient c/o chest pain rated at 7, VSS. Nitrostat 0.4mg  given SL as ordered x 2 doses. Vital signs remained stable and pain down to 2 after second dose of Nitrostat. Will continue patient.

## 2018-04-06 NOTE — Progress Notes (Signed)
*  PRELIMINARY RESULTS* Echocardiogram 2D Echocardiogram has been performed.  Leavy Cella 04/06/2018, 9:17 AM

## 2018-04-06 NOTE — Care Management Obs Status (Signed)
[place] NOTIFICATION   Patient Details  Name: [name] MRN: [id number] Date of Birth: 1950-04-17   Medicare Observation Status Notification Given:  Yes    Sherald Barge, RN 04/06/2018, 9:26 AM

## 2018-04-13 ENCOUNTER — Ambulatory Visit (HOSPITAL_COMMUNITY): Payer: Medicare HMO

## 2018-04-17 ENCOUNTER — Ambulatory Visit: Payer: Medicare HMO | Admitting: Nurse Practitioner

## 2018-04-18 ENCOUNTER — Other Ambulatory Visit: Payer: Self-pay

## 2018-04-18 DIAGNOSIS — R0789 Other chest pain: Secondary | ICD-10-CM

## 2018-04-19 ENCOUNTER — Encounter (HOSPITAL_BASED_OUTPATIENT_CLINIC_OR_DEPARTMENT_OTHER)
Admission: RE | Admit: 2018-04-19 | Discharge: 2018-04-19 | Disposition: A | Discharge status: Home / Self Care | Payer: Medicare HMO | Source: Ambulatory Visit | Attending: Cardiology | Admitting: Cardiology

## 2018-04-19 ENCOUNTER — Encounter (HOSPITAL_COMMUNITY)
Admission: RE | Admit: 2018-04-19 | Discharge: 2018-04-19 | Disposition: A | Discharge status: Home / Self Care | Payer: Medicare HMO | Source: Ambulatory Visit | Attending: Cardiology | Admitting: Cardiology

## 2018-04-19 DIAGNOSIS — R0789 Other chest pain: Secondary | ICD-10-CM | POA: Diagnosis not present

## 2018-04-19 MED ORDER — SODIUM CHLORIDE 0.9% FLUSH
INTRAVENOUS | Status: AC
  Administered 2018-04-19: 10 mL via INTRAVENOUS
  Filled 2018-04-19: qty 10

## 2018-04-19 MED ORDER — TECHNETIUM TC 99M TETROFOSMIN IV KIT
30.0000 | PACK | Freq: Once | INTRAVENOUS | Status: AC | PRN
  Administered 2018-04-19: 30 via INTRAVENOUS

## 2018-04-19 MED ORDER — REGADENOSON 0.4 MG/5ML IV SOLN
INTRAVENOUS | Status: AC
  Administered 2018-04-19: 0.4 mg via INTRAVENOUS
  Filled 2018-04-19: qty 5

## 2018-04-20 ENCOUNTER — Encounter (HOSPITAL_COMMUNITY)
Admission: RE | Admit: 2018-04-20 | Discharge: 2018-04-20 | Disposition: A | Discharge status: Home / Self Care | Payer: Medicare HMO | Source: Ambulatory Visit | Attending: Cardiology | Admitting: Cardiology

## 2018-04-20 LAB — NM MYOCAR MULTI W/SPECT W/WALL MOTION / EF
LHR: 0.3
LV dias vol: 56 mL (ref 46–106)
LV sys vol: 21 mL
Peak HR: 93 {beats}/min
Rest HR: 76 {beats}/min
SDS: 3
SRS: 1
SSS: 4
TID: 0.86

## 2018-04-20 MED ORDER — TECHNETIUM TC 99M TETROFOSMIN IV KIT
30.0000 | PACK | Freq: Once | INTRAVENOUS | Status: AC | PRN
  Administered 2018-04-20: 30.8 via INTRAVENOUS

## 2018-04-27 ENCOUNTER — Ambulatory Visit (INDEPENDENT_AMBULATORY_CARE_PROVIDER_SITE_OTHER): Payer: Medicare HMO | Admitting: Student

## 2018-04-27 ENCOUNTER — Encounter: Payer: Self-pay | Admitting: Student

## 2018-04-27 VITALS — BP 138/78 | HR 92 | Ht 64.0 in | Wt 278.0 lb

## 2018-04-27 DIAGNOSIS — K219 Gastro-esophageal reflux disease without esophagitis: Secondary | ICD-10-CM

## 2018-04-27 DIAGNOSIS — R072 Precordial pain: Secondary | ICD-10-CM | POA: Diagnosis not present

## 2018-04-27 DIAGNOSIS — R0609 Other forms of dyspnea: Secondary | ICD-10-CM

## 2018-04-27 DIAGNOSIS — I10 Essential (primary) hypertension: Secondary | ICD-10-CM | POA: Diagnosis not present

## 2018-04-27 DIAGNOSIS — E782 Mixed hyperlipidemia: Secondary | ICD-10-CM

## 2018-04-27 MED ORDER — LORATADINE 10 MG PO TABS: 10.0000 mg | ORAL_TABLET | Freq: Every day | ORAL | Status: DC

## 2018-04-27 MED ORDER — INSULIN DETEMIR 100 UNIT/ML ~~LOC~~ SOLN: 18.0000 [IU] | Freq: Every day | SUBCUTANEOUS | Status: DC

## 2018-04-27 MED ORDER — NITROGLYCERIN 0.4 MG SL SUBL: 0.4000 mg | SUBLINGUAL_TABLET | SUBLINGUAL | 2 refills | Status: AC | PRN

## 2018-04-27 NOTE — Patient Instructions (Signed)
Medication Instructions:  Your physician recommends that you continue on your current medications as directed. Please refer to the Current Medication list given to you today.  If you need a refill on your cardiac medications before your next appointment, please call your pharmacy.   Lab work: None today If you have labs (blood work) drawn today and your tests are completely normal, you will receive your results only by: Marland Kitchen MyChart Message (if you have MyChart) OR . A paper copy in the mail If you have any lab test that is abnormal or we need to change your treatment, we will call you to review the results.  Testing/Procedures: None today  Follow-Up: At [facility], you and your health needs are our priority.  As part of our continuing mission to provide you with exceptional heart care, we have created designated Provider Care Teams.  These Care Teams include your primary Cardiologist (physician) and Advanced Practice Providers (APPs -  Physician Assistants and Nurse Practitioners) who all work together to provide you with the care you need, when you need it. You will need a follow up appointment in 6 months.  Please call our office 2 months in advance to schedule this appointment.  You may see Dr.Branch  or one of the following Advanced Practice Providers on your designated Care Team:   [place], PA-C [facility]) . Ermalinda Barrios, PA-C ([place])  Any Other Special Instructions Will Be Listed Below (If Applicable). None

## 2018-04-27 NOTE — Progress Notes (Signed)
Cardiology Office Note    Date:  04/27/2018   ID:  [name], DOB Feb 06, 1951, MRN [id number]  PCP:  [name], MD  Cardiologist: [name], MD    Chief Complaint  Patient presents with  . Hospitalization Follow-up    s/p stress testing    History of Present Illness:    [name] is a 68 y.o. female with past medical history of HTN, HLD, Type 2 DM, and Bipolar disorder  who presents to the office today for hospital follow-up.  She recently presented to [facility] ED on 04/05/2018 after developing the chest pain while undergoing pulmonary function testing. She reported having episodes of discomfort for the past 6+ months along her entire precordium which could last for hours at a time and was not associated with exertion or positional changes. EKG showed no acute ischemic changes and cyclic troponin values were negative.  Echocardiogram was performed which showed a preserved EF of 60 to 65% with no regional wall motion abnormalities. No significant valve abnormalities identified either. An outpatient stress test was recommended for further evaluation. This was performed on 04/19/2018 and showed a small apical anterior septal defect which was thought to be most consistent with soft tissue attenuation and no definitive ischemia was identified. The study was overall low risk.  In talking with the patient today, she denies any recurrent episodes of chest pain since her hospitalization. Says that she continues to have dyspnea on exertion which has been occurring for the past several years.  Yesterday, while walking around [place], she developed significant dyspnea and EMS was actually called.  Says she was evaluated and placed on oxygen for less than 30 minutes and was not transported to the ED for further evaluation. She reports feeling back to baseline since. Says that she typically uses a motorized scooter at the supermarket but none were available while there. She denies any  specific orthopnea, PND, or lower extremity edema.  She does not check her blood pressure regularly at home but it is well controlled at 138/78 during today's visit.  Past Medical History:  Diagnosis Date  . Bipolar 1 disorder ([place])   . Cancer ([place])   . Diabetes mellitus without complication ([place])   . Hypertension     Past Surgical History:  Procedure Laterality Date  . ABDOMINAL HYSTERECTOMY    . CHOLECYSTECTOMY    . ORIF FEMUR FRACTURE Left 11/30/2016   Procedure: OPEN REDUCTION INTERNAL FIXATION (ORIF) DISTAL FEMUR FRACTURE;  Surgeon: Rod Can, MD;  Location: [place];  Service: Orthopedics;  Laterality: Left;  . TONSILLECTOMY      Current Medications: Outpatient Medications Prior to Visit  Medication Sig Dispense Refill  . albuterol (PROVENTIL HFA;VENTOLIN HFA) 108 (90 Base) MCG/ACT inhaler Inhale 2 puffs into the lungs every 8 (eight) hours as needed.  3  . aspirin EC 81 MG tablet Take 81 mg by mouth daily.    Marland Kitchen BYDUREON 2 MG PEN One injection once a week wed  0  . hydrochlorothiazide (HYDRODIURIL) 25 MG tablet TAKE 1 TABLET BY MOUTH ONCE DAILY FOR BLOOD PRESSURE  0  . hydrOXYzine (ATARAX/VISTARIL) 25 MG tablet Take 25 mg by mouth every 6 (six) hours as needed.    Marland Kitchen ibuprofen (ADVIL,MOTRIN) 200 MG tablet Take 200 mg by mouth every 6 (six) hours as needed.    Marland Kitchen lisinopril (PRINIVIL,ZESTRIL) 20 MG tablet Take 20 mg by mouth daily.    Marland Kitchen lovastatin (MEVACOR) 20 MG tablet Take 20 mg  by mouth at bedtime.    . meclizine (ANTIVERT) 25 MG tablet Take 1 tablet by mouth 2 (two) times daily as needed.    . metFORMIN (GLUCOPHAGE) 500 MG tablet Take by mouth 2 (two) times daily with a meal.    . metoprolol tartrate (LOPRESSOR) 50 MG tablet Take 50 mg by mouth 2 (two) times daily.  6  . pantoprazole (PROTONIX) 40 MG tablet TAKE 1 TABLET BY MOUTH ONCE DAILY (STOP NEXIUM ESOMEPRAZOLE)  3  . promethazine (PHENERGAN) 25 MG tablet TAKE 1 TABLET BY MOUTH 2 TO 3 TIMES DAILY AS NEEDED FOR NAUSEA  FOR VOMITING  99  . acetaminophen (TYLENOL) 325 MG tablet Take 2 tablets (650 mg total) by mouth every 6 (six) hours as needed for mild pain (or Fever >/= 101). 30 tablet 0  . ALPRAZolam (XANAX) 0.5 MG tablet Take 1 tablet (0.5 mg total) by mouth daily. 30 tablet 0  . citalopram (CELEXA) 40 MG tablet Take 40 mg by mouth daily.    . insulin detemir (LEVEMIR) 100 UNIT/ML injection Inject 24 Units into the skin at bedtime.    . nitroGLYCERIN (NITROSTAT) 0.4 MG SL tablet Place 1 tablet (0.4 mg total) under the tongue every 5 (five) minutes x 3 doses as needed for chest pain. 30 tablet 12  . senna-docusate (SENOKOT-S) 8.6-50 MG tablet Take 1 tablet by mouth at bedtime. 30 tablet 0   No facility-administered medications prior to visit.      Allergies:   Latex and Sulfa antibiotics   Social History   Socioeconomic History  . Marital status: Married    Spouse name: Not on file  . Number of children: Not on file  . Years of education: Not on file  . Highest education level: Not on file  Occupational History  . Not on file  Social Needs  . Financial resource strain: Not on file  . Food insecurity:    Worry: Not on file    Inability: Not on file  . Transportation needs:    Medical: Not on file    Non-medical: Not on file  Tobacco Use  . Smoking status: Former Research scientist (life sciences)  . Smokeless tobacco: Never Used  Substance and Sexual Activity  . Alcohol use: No  . Drug use: No  . Sexual activity: Not on file  Lifestyle  . Physical activity:    Days per week: Not on file    Minutes per session: Not on file  . Stress: Not on file  Relationships  . Social connections:    Talks on phone: Not on file    Gets together: Not on file    Attends religious service: Not on file    Active member of club or organization: Not on file    Attends meetings of clubs or organizations: Not on file    Relationship status: Not on file  Other Topics Concern  . Not on file  Social History Narrative  . Not on  file     Family History:  The patient's family history includes CAD in her brother and father.   Review of Systems:   Please see the history of present illness.     General:  No chills, fever, night sweats or weight changes.  Cardiovascular:  No chest pain, edema, orthopnea, palpitations, paroxysmal nocturnal dyspnea. Positive for dyspnea on exertion.  Dermatological: No rash, lesions/masses Respiratory: No cough, Positive for dyspnea. Urologic: No hematuria, dysuria Abdominal:   No nausea, vomiting, diarrhea, bright red blood per  rectum, melena, or hematemesis Neurologic:  No visual changes, wkns, changes in mental status. All other systems reviewed and are otherwise negative except as noted above.   Physical Exam:    VS:  BP 138/78 (BP Location: Left Arm)   Pulse 92   Ht 5\' 4"  (1.626 m)   Wt 278 lb (126.1 kg)   SpO2 95%   BMI 47.72 kg/m    General: Well developed, well nourished obese Caucasian female appearing in no acute distress. Head: Normocephalic, atraumatic, sclera non-icteric, no xanthomas, nares are without discharge.  Neck: No carotid bruits. JVD not elevated.  Lungs: Respirations regular and unlabored, without wheezes or rales.  Heart: Regular rate and rhythm. No S3 or S4.  No murmur, no rubs, or gallops appreciated. Abdomen: Soft, non-tender, non-distended with normoactive bowel sounds. No hepatomegaly. No rebound/guarding. No obvious abdominal masses. Msk:  Strength and tone appear normal for age. No joint deformities or effusions. Extremities: No clubbing or cyanosis. No lower extremity edema.  Distal pedal pulses are 2+ bilaterally. Neuro: Alert and oriented X 3. Moves all extremities spontaneously. No focal deficits noted. Psych:  Responds to questions appropriately with a normal affect. Skin: No rashes or lesions noted  Wt Readings from Last 3 Encounters:  04/27/18 278 lb (126.1 kg)  04/05/18 272 lb 0.8 oz (123.4 kg)  12/06/17 260 lb (117.9 kg)      Studies/Labs Reviewed:   EKG:  EKG is not ordered today.   Recent Labs: 12/06/2017: B Natriuretic Peptide 12.0 04/05/2018: ALT 23; BUN 18; Creatinine, Ser 0.86; Hemoglobin 14.5; Platelets 325; Potassium 4.0; Sodium 138   Lipid Panel    Component Value Date/Time   CHOL 165 04/06/2018 0239   TRIG 333 (H) 04/06/2018 0239   HDL 44 04/06/2018 0239   CHOLHDL 3.8 04/06/2018 0239   VLDL 67 (H) 04/06/2018 0239   LDLCALC 54 04/06/2018 0239    Additional studies/ records that were reviewed today include:   Echocardiogram: 04/06/2018 IMPRESSIONS  1. The left ventricle has normal systolic function with an ejection fraction of 60-65%. The cavity size was normal. There is moderately increased left ventricular wall thickness. Left ventricular diastolic Doppler parameters are consistent with impaired  relaxation.  2. The right ventricle has normal systolic function. The cavity was normal. There is no increase in right ventricular wall thickness.  3. The mitral valve is normal in structure. No evidence of mitral valve stenosis.  4. The tricuspid valve is normal in structure.  5. The aortic valve is normal in structure. There is mild thickening of the aortic valve.  6. The aortic root is normal in size and structure.  7. Right atrial pressure is estimated at 3 mmHg.  8. The interatrial septum was not well visualized.  NST: 04/19/2018  No diagnostic ST segment changes to indicate ischemia.  Small, very mild intensity, apical anteroseptal defect that is most consistent with soft tissue attenuation. No definite ischemia.  This is a low risk study.  Nuclear stress EF: 63%.  Assessment:    1. Precordial chest pain   2. Dyspnea on exertion   3. Essential hypertension   4. Mixed hyperlipidemia   5. Gastroesophageal reflux disease, esophagitis presence not specified      Plan:   In order of problems listed above:  1. Precordial Chest Pain/ Dyspnea on Exertion - She reports having  episodes of chest discomfort for 6+ months which can occur at rest or with activity and last for hours at a time. Enzymes were  negative during admission and echocardiogram showed a preserved EF of 60 to 65% with no regional wall motion abnormalities. NST showed a small apical anterior septal defect which was thought to be most consistent with soft tissue attenuation and no definitive ischemia was identified. - She denies any recurrent episodes of chest discomfort but continues to experience dyspnea on exertion.  She is being treated for presumed COPD and needs to be rescheduled for outpatient PFT's as these were not completed prior to her recent admission. Will see if scheduling can assist with this. Would not anticipate further ischemic evaluation at this time. Continue with risk factor modification. Remains on ASA, beta-blocker, and statin therapy.  2. HTN - BP is well controlled at 138/78 during today's visit. Continue HCTZ 25 mg daily, Lisinopril 20 mg daily, and Lopressor 50 mg twice daily.  3. HLD - Followed by PCP. FLP during recent admission showed total cholesterol 165, triglycerides 333, HDL 44, and LDL 54. She remains on Lovastatin 20 mg daily.  4. GERD - continue Protonix 40mg  daily.   Medication Adjustments/Labs and Tests Ordered: Current medicines are reviewed at length with the patient today.  Concerns regarding medicines are outlined above.  Medication changes, Labs and Tests ordered today are listed in the Patient Instructions below. Patient Instructions  Medication Instructions:  Your physician recommends that you continue on your current medications as directed. Please refer to the Current Medication list given to you today.  If you need a refill on your cardiac medications before your next appointment, please call your pharmacy.   Lab work: None today If you have labs (blood work) drawn today and your tests are completely normal, you will receive your results only  by: Marland Kitchen MyChart Message (if you have MyChart) OR . A paper copy in the mail If you have any lab test that is abnormal or we need to change your treatment, we will call you to review the results.  Testing/Procedures: None today  Follow-Up: At [facility], you and your health needs are our priority.  As part of our continuing mission to provide you with exceptional heart care, we have created designated Provider Care Teams.  These Care Teams include your primary Cardiologist (physician) and Advanced Practice Providers (APPs -  Physician Assistants and Nurse Practitioners) who all work together to provide you with the care you need, when you need it. You will need a follow up appointment in 6 months.  Please call our office 2 months in advance to schedule this appointment.  You may see Dr.Branch  or one of the following Advanced Practice Providers on your designated Care Team:   [place], PA-C [facility]) . Ermalinda Barrios, PA-C ([place])  Any Other Special Instructions Will Be Listed Below (If Applicable). None   Signed, Erma Heritage, PA-C  04/27/2018 4:40 PM    [facility] [address] [place], [place] [address] Phone: [phone] Fax: [phone]

## 2018-06-08 ENCOUNTER — Other Ambulatory Visit: Payer: Self-pay

## 2018-06-08 ENCOUNTER — Ambulatory Visit: Payer: Medicare HMO | Admitting: Nurse Practitioner

## 2018-07-10 ENCOUNTER — Ambulatory Visit (INDEPENDENT_AMBULATORY_CARE_PROVIDER_SITE_OTHER): Payer: Medicare HMO | Admitting: Otolaryngology

## 2018-08-02 ENCOUNTER — Ambulatory Visit: Payer: Self-pay | Admitting: Nurse Practitioner

## 2018-08-21 ENCOUNTER — Ambulatory Visit (INDEPENDENT_AMBULATORY_CARE_PROVIDER_SITE_OTHER): Payer: Medicare HMO | Admitting: Otolaryngology

## 2018-08-21 DIAGNOSIS — R42 Dizziness and giddiness: Secondary | ICD-10-CM | POA: Diagnosis not present

## 2018-08-21 DIAGNOSIS — H903 Sensorineural hearing loss, bilateral: Secondary | ICD-10-CM | POA: Diagnosis not present

## 2018-08-21 DIAGNOSIS — H6123 Impacted cerumen, bilateral: Secondary | ICD-10-CM

## 2018-09-04 ENCOUNTER — Encounter: Payer: Self-pay | Admitting: Nurse Practitioner

## 2018-09-04 ENCOUNTER — Ambulatory Visit: Payer: Self-pay | Admitting: Nurse Practitioner

## 2018-09-04 ENCOUNTER — Telehealth: Payer: Self-pay | Admitting: Nurse Practitioner

## 2018-09-04 NOTE — Telephone Encounter (Signed)
PATIENT WAS A NO SHOW AND LETTER SENT  °

## 2018-09-04 NOTE — Telephone Encounter (Signed)
Noted  

## 2019-01-30 DIAGNOSIS — F419 Anxiety disorder, unspecified: Secondary | ICD-10-CM | POA: Insufficient documentation

## 2019-01-30 DIAGNOSIS — Z794 Long term (current) use of insulin: Secondary | ICD-10-CM | POA: Insufficient documentation

## 2019-08-24 ENCOUNTER — Ambulatory Visit: Payer: Medicare HMO | Admitting: Orthopedic Surgery

## 2019-09-07 ENCOUNTER — Ambulatory Visit: Payer: Medicare HMO | Admitting: Orthopedic Surgery

## 2019-09-18 DIAGNOSIS — Z6841 Body Mass Index (BMI) 40.0 and over, adult: Secondary | ICD-10-CM | POA: Insufficient documentation

## 2019-09-18 DIAGNOSIS — I129 Hypertensive chronic kidney disease with stage 1 through stage 4 chronic kidney disease, or unspecified chronic kidney disease: Secondary | ICD-10-CM | POA: Insufficient documentation

## 2019-09-18 DIAGNOSIS — E559 Vitamin D deficiency, unspecified: Secondary | ICD-10-CM | POA: Insufficient documentation

## 2019-09-18 DIAGNOSIS — N1831 Chronic kidney disease, stage 3a: Secondary | ICD-10-CM | POA: Insufficient documentation

## 2019-09-18 DIAGNOSIS — E1142 Type 2 diabetes mellitus with diabetic polyneuropathy: Secondary | ICD-10-CM | POA: Insufficient documentation

## 2019-10-08 ENCOUNTER — Encounter: Payer: Self-pay | Admitting: Orthopedic Surgery

## 2019-10-08 ENCOUNTER — Ambulatory Visit: Payer: Medicare HMO | Admitting: Orthopedic Surgery

## 2019-11-13 ENCOUNTER — Ambulatory Visit (HOSPITAL_COMMUNITY): Payer: Medicare HMO | Admitting: Physical Therapy

## 2019-11-27 ENCOUNTER — Ambulatory Visit (HOSPITAL_COMMUNITY): Payer: Medicare HMO | Admitting: Physical Therapy

## 2019-11-27 ENCOUNTER — Encounter (HOSPITAL_COMMUNITY): Payer: Self-pay

## 2019-12-06 ENCOUNTER — Ambulatory Visit (HOSPITAL_COMMUNITY): Payer: Medicare HMO | Admitting: Physical Therapy

## 2019-12-24 ENCOUNTER — Telehealth (HOSPITAL_COMMUNITY): Payer: Self-pay | Admitting: Physical Therapy

## 2019-12-24 NOTE — Telephone Encounter (Signed)
pt cancelled referral and wants it closed because she can not afford to come

## 2019-12-25 ENCOUNTER — Ambulatory Visit (HOSPITAL_COMMUNITY): Payer: Medicare HMO | Admitting: Physical Therapy

## 2020-01-24 ENCOUNTER — Ambulatory Visit: Payer: Self-pay | Admitting: Podiatry

## 2020-02-07 ENCOUNTER — Other Ambulatory Visit: Payer: Self-pay

## 2020-02-07 ENCOUNTER — Ambulatory Visit: Payer: Medicare HMO | Admitting: Podiatry

## 2020-02-07 DIAGNOSIS — E119 Type 2 diabetes mellitus without complications: Secondary | ICD-10-CM | POA: Diagnosis not present

## 2020-02-07 DIAGNOSIS — B351 Tinea unguium: Secondary | ICD-10-CM | POA: Diagnosis not present

## 2020-02-07 DIAGNOSIS — M79674 Pain in right toe(s): Secondary | ICD-10-CM | POA: Diagnosis not present

## 2020-02-07 DIAGNOSIS — M79675 Pain in left toe(s): Secondary | ICD-10-CM

## 2020-02-09 NOTE — Progress Notes (Signed)
  Subjective:  Patient ID: [name], female    DOB: Jan 08, 1951,  MRN: [id number]  Chief Complaint  Patient presents with  . Diabetes    Referred for diabetic foot exam and nail care.  Her A1c is 6.7%    69 y.o. female presents with the above complaint. History confirmed with patient.   Objective:  Physical Exam: warm, good capillary refill, no trophic changes or ulcerative lesions, normal DP and PT pulses, normal sensory exam and onychomycosis noted to all 10 toes  Assessment:   1. Diabetes mellitus without complication ([place])   2. Onychomycosis   3. Pain due to onychomycosis of toenails of both feet      Plan:  Patient was evaluated and treated and all questions answered.  Patient educated on diabetes. Discussed proper diabetic foot care and discussed risks and complications of disease. Educated patient in depth on reasons to return to the office immediately should he/she discover anything concerning or new on the feet. All questions answered. Discussed proper shoes as well.   Discussed the etiology and treatment options for the condition in detail with the patient. Educated patient on the topical and oral treatment options for mycotic nails. Recommended debridement of the nails today. Sharp and mechanical debridement performed of all painful and mycotic nails today. Nails debrided in length and thickness using a nail nipper and a mechanical burr to level of comfort. Discussed treatment options including appropriate shoe gear. Follow up as needed for painful nails.    Return in about 3 months (around 05/07/2020) for diabetic foot care.

## 2020-02-19 ENCOUNTER — Other Ambulatory Visit (HOSPITAL_COMMUNITY): Payer: Self-pay | Admitting: Family Medicine

## 2020-02-19 DIAGNOSIS — Z1231 Encounter for screening mammogram for malignant neoplasm of breast: Secondary | ICD-10-CM

## 2020-02-19 DIAGNOSIS — Z780 Asymptomatic menopausal state: Secondary | ICD-10-CM

## 2020-02-25 ENCOUNTER — Other Ambulatory Visit (HOSPITAL_COMMUNITY): Payer: Medicare HMO

## 2020-02-25 ENCOUNTER — Ambulatory Visit (HOSPITAL_COMMUNITY): Payer: Medicare HMO

## 2020-02-26 ENCOUNTER — Encounter: Payer: Self-pay | Admitting: Internal Medicine

## 2020-03-13 DIAGNOSIS — E1142 Type 2 diabetes mellitus with diabetic polyneuropathy: Secondary | ICD-10-CM | POA: Diagnosis not present

## 2020-03-13 DIAGNOSIS — I10 Essential (primary) hypertension: Secondary | ICD-10-CM | POA: Diagnosis not present

## 2020-03-13 DIAGNOSIS — E785 Hyperlipidemia, unspecified: Secondary | ICD-10-CM | POA: Diagnosis not present

## 2020-03-14 ENCOUNTER — Other Ambulatory Visit (HOSPITAL_COMMUNITY): Payer: Medicare HMO

## 2020-03-14 ENCOUNTER — Ambulatory Visit (HOSPITAL_COMMUNITY): Payer: Medicare HMO

## 2020-03-18 DIAGNOSIS — Z6841 Body Mass Index (BMI) 40.0 and over, adult: Secondary | ICD-10-CM | POA: Diagnosis not present

## 2020-03-18 DIAGNOSIS — Z79899 Other long term (current) drug therapy: Secondary | ICD-10-CM | POA: Diagnosis not present

## 2020-03-18 DIAGNOSIS — Z794 Long term (current) use of insulin: Secondary | ICD-10-CM | POA: Diagnosis not present

## 2020-03-18 DIAGNOSIS — E119 Type 2 diabetes mellitus without complications: Secondary | ICD-10-CM | POA: Diagnosis not present

## 2020-03-18 DIAGNOSIS — E785 Hyperlipidemia, unspecified: Secondary | ICD-10-CM | POA: Diagnosis not present

## 2020-03-18 DIAGNOSIS — Z130 Encounter for screening for diseases of the blood and blood-forming organs and certain disorders involving the immune mechanism: Secondary | ICD-10-CM | POA: Diagnosis not present

## 2020-03-18 DIAGNOSIS — J449 Chronic obstructive pulmonary disease, unspecified: Secondary | ICD-10-CM | POA: Diagnosis not present

## 2020-03-18 DIAGNOSIS — I10 Essential (primary) hypertension: Secondary | ICD-10-CM | POA: Diagnosis not present

## 2020-03-18 DIAGNOSIS — Z7984 Long term (current) use of oral hypoglycemic drugs: Secondary | ICD-10-CM | POA: Diagnosis not present

## 2020-03-20 ENCOUNTER — Ambulatory Visit (HOSPITAL_COMMUNITY)
Admission: RE | Admit: 2020-03-20 | Discharge: 2020-03-20 | Disposition: A | Discharge status: Home / Self Care | Payer: Medicare HMO | Source: Ambulatory Visit | Attending: Family Medicine | Admitting: Family Medicine

## 2020-03-20 ENCOUNTER — Other Ambulatory Visit: Payer: Self-pay

## 2020-03-20 DIAGNOSIS — Z8739 Personal history of other diseases of the musculoskeletal system and connective tissue: Secondary | ICD-10-CM | POA: Diagnosis not present

## 2020-03-20 DIAGNOSIS — Z87828 Personal history of other (healed) physical injury and trauma: Secondary | ICD-10-CM | POA: Diagnosis not present

## 2020-03-20 DIAGNOSIS — Z780 Asymptomatic menopausal state: Secondary | ICD-10-CM | POA: Insufficient documentation

## 2020-03-20 DIAGNOSIS — R29890 Loss of height: Secondary | ICD-10-CM | POA: Diagnosis not present

## 2020-03-20 DIAGNOSIS — Z1231 Encounter for screening mammogram for malignant neoplasm of breast: Secondary | ICD-10-CM | POA: Insufficient documentation

## 2020-03-22 DIAGNOSIS — J449 Chronic obstructive pulmonary disease, unspecified: Secondary | ICD-10-CM | POA: Insufficient documentation

## 2020-03-26 DIAGNOSIS — Z1152 Encounter for screening for COVID-19: Secondary | ICD-10-CM | POA: Diagnosis not present

## 2020-03-26 DIAGNOSIS — R630 Anorexia: Secondary | ICD-10-CM | POA: Diagnosis not present

## 2020-03-26 DIAGNOSIS — R112 Nausea with vomiting, unspecified: Secondary | ICD-10-CM | POA: Diagnosis not present

## 2020-03-26 DIAGNOSIS — R5383 Other fatigue: Secondary | ICD-10-CM | POA: Diagnosis not present

## 2020-04-09 ENCOUNTER — Ambulatory Visit: Payer: Medicare HMO | Admitting: Nurse Practitioner

## 2020-05-08 ENCOUNTER — Ambulatory Visit: Payer: Medicare HMO | Admitting: Podiatry

## 2020-05-20 DIAGNOSIS — E785 Hyperlipidemia, unspecified: Secondary | ICD-10-CM | POA: Diagnosis not present

## 2020-05-20 DIAGNOSIS — R1084 Generalized abdominal pain: Secondary | ICD-10-CM | POA: Diagnosis not present

## 2020-05-20 DIAGNOSIS — Z0001 Encounter for general adult medical examination with abnormal findings: Secondary | ICD-10-CM | POA: Diagnosis not present

## 2020-05-20 DIAGNOSIS — I10 Essential (primary) hypertension: Secondary | ICD-10-CM | POA: Diagnosis not present

## 2020-05-20 DIAGNOSIS — Z79899 Other long term (current) drug therapy: Secondary | ICD-10-CM | POA: Diagnosis not present

## 2020-05-20 DIAGNOSIS — Z7984 Long term (current) use of oral hypoglycemic drugs: Secondary | ICD-10-CM | POA: Diagnosis not present

## 2020-05-20 DIAGNOSIS — M810 Age-related osteoporosis without current pathological fracture: Secondary | ICD-10-CM | POA: Diagnosis not present

## 2020-05-20 DIAGNOSIS — E1142 Type 2 diabetes mellitus with diabetic polyneuropathy: Secondary | ICD-10-CM | POA: Diagnosis not present

## 2020-05-20 DIAGNOSIS — J449 Chronic obstructive pulmonary disease, unspecified: Secondary | ICD-10-CM | POA: Diagnosis not present

## 2020-05-22 ENCOUNTER — Ambulatory Visit: Payer: Medicare HMO | Admitting: Nurse Practitioner

## 2020-06-02 ENCOUNTER — Ambulatory Visit: Payer: Medicare HMO | Admitting: Podiatry

## 2020-06-02 ENCOUNTER — Encounter: Payer: Self-pay | Admitting: Gastroenterology

## 2020-06-11 DIAGNOSIS — E785 Hyperlipidemia, unspecified: Secondary | ICD-10-CM | POA: Diagnosis not present

## 2020-06-11 DIAGNOSIS — Z79899 Other long term (current) drug therapy: Secondary | ICD-10-CM | POA: Diagnosis not present

## 2020-06-11 DIAGNOSIS — I10 Essential (primary) hypertension: Secondary | ICD-10-CM | POA: Diagnosis not present

## 2020-06-11 DIAGNOSIS — E1142 Type 2 diabetes mellitus with diabetic polyneuropathy: Secondary | ICD-10-CM | POA: Diagnosis not present

## 2020-06-11 DIAGNOSIS — Z7984 Long term (current) use of oral hypoglycemic drugs: Secondary | ICD-10-CM | POA: Diagnosis not present

## 2020-06-17 DIAGNOSIS — M810 Age-related osteoporosis without current pathological fracture: Secondary | ICD-10-CM | POA: Diagnosis not present

## 2020-06-17 DIAGNOSIS — N3001 Acute cystitis with hematuria: Secondary | ICD-10-CM | POA: Diagnosis not present

## 2020-06-17 DIAGNOSIS — I10 Essential (primary) hypertension: Secondary | ICD-10-CM | POA: Diagnosis not present

## 2020-06-17 DIAGNOSIS — Z7984 Long term (current) use of oral hypoglycemic drugs: Secondary | ICD-10-CM | POA: Diagnosis not present

## 2020-06-17 DIAGNOSIS — E119 Type 2 diabetes mellitus without complications: Secondary | ICD-10-CM | POA: Diagnosis not present

## 2020-06-17 DIAGNOSIS — E785 Hyperlipidemia, unspecified: Secondary | ICD-10-CM | POA: Diagnosis not present

## 2020-06-17 DIAGNOSIS — Z79899 Other long term (current) drug therapy: Secondary | ICD-10-CM | POA: Diagnosis not present

## 2020-06-17 DIAGNOSIS — R42 Dizziness and giddiness: Secondary | ICD-10-CM | POA: Diagnosis not present

## 2020-07-01 ENCOUNTER — Ambulatory Visit: Payer: Medicare HMO | Admitting: Nurse Practitioner

## 2020-07-14 ENCOUNTER — Ambulatory Visit: Payer: Medicare HMO | Admitting: Gastroenterology

## 2020-09-04 ENCOUNTER — Other Ambulatory Visit: Payer: Self-pay

## 2020-09-04 ENCOUNTER — Encounter: Payer: Self-pay | Admitting: Internal Medicine

## 2020-09-04 ENCOUNTER — Ambulatory Visit (INDEPENDENT_AMBULATORY_CARE_PROVIDER_SITE_OTHER): Payer: Medicare HMO | Admitting: Internal Medicine

## 2020-09-04 ENCOUNTER — Telehealth: Payer: Self-pay | Admitting: *Deleted

## 2020-09-04 DIAGNOSIS — Z1211 Encounter for screening for malignant neoplasm of colon: Secondary | ICD-10-CM | POA: Diagnosis not present

## 2020-09-04 DIAGNOSIS — K219 Gastro-esophageal reflux disease without esophagitis: Secondary | ICD-10-CM | POA: Insufficient documentation

## 2020-09-04 MED ORDER — PEG 3350-KCL-NA BICARB-NACL 420 G PO SOLR: ORAL | 0 refills | Status: DC

## 2020-09-04 NOTE — Telephone Encounter (Signed)
Called pt, no answer no VM. Patient needs TCS with propofol, ASA 3 with Dr. Abbey Chatters

## 2020-09-04 NOTE — Progress Notes (Signed)
Primary Care Physician:  [name], MD Primary Gastroenterologist:  Dr. Abbey Chatters  Chief Complaint  Patient presents with   Colonoscopy    Last tcs 2006/2007?   Abdominal Pain    Right abd    HPI:   [name] is a 70 y.o. female who presents to the clinic today by referral from her PCP Dr. Davis Gourd for evaluation.  Patient states she is due for colonoscopy for colon cancer screening purposes.  Last colonoscopy 2006 or 2007 which she states was unremarkable.  I do not have access to this report.  No unintentional weight loss.  No melena hematochezia.  No family history of colorectal malignancy that she knows of.  Does have chronic GERD which is well controlled on pantoprazole.  Notes occasional burning in her substernal region though no dysphagia or odynophagia.  No chest or epigastric pain.  Symptoms are not worse with meals.  Symptoms do not radiate.  Describes them as mild  Past Medical History:  Diagnosis Date   Bipolar 1 disorder ([place])    Cancer ([place])    Diabetes mellitus without complication ([place])    Hypertension     Past Surgical History:  Procedure Laterality Date   ABDOMINAL HYSTERECTOMY     CHOLECYSTECTOMY     ORIF FEMUR FRACTURE Left 11/30/2016   Procedure: OPEN REDUCTION INTERNAL FIXATION (ORIF) DISTAL FEMUR FRACTURE;  Surgeon: Rod Can, MD;  Location: [place];  Service: Orthopedics;  Laterality: Left;   TONSILLECTOMY      Current Outpatient Medications  Medication Sig Dispense Refill   albuterol (PROVENTIL HFA;VENTOLIN HFA) 108 (90 Base) MCG/ACT inhaler Inhale 2 puffs into the lungs every 8 (eight) hours as needed.  3   BYDUREON 2 MG PEN One injection once a week wed  0   gabapentin (NEURONTIN) 100 MG capsule Take 100 mg by mouth 2 (two) times daily.     hydrochlorothiazide (HYDRODIURIL) 25 MG tablet TAKE 1 TABLET BY MOUTH ONCE DAILY FOR BLOOD PRESSURE  0   hydrOXYzine (ATARAX/VISTARIL) 25 MG tablet Take 25 mg by mouth every 6 (six) hours as needed.      ibuprofen (ADVIL,MOTRIN) 200 MG tablet Take 200 mg by mouth every 6 (six) hours as needed.     insulin detemir (LEVEMIR) 100 UNIT/ML injection Inject 0.18 mLs (18 Units total) into the skin at bedtime. 10 mL    lisinopril (PRINIVIL,ZESTRIL) 20 MG tablet Take 20 mg by mouth daily.     loratadine (CLARITIN) 10 MG tablet Take 1 tablet (10 mg total) by mouth daily.     meclizine (ANTIVERT) 25 MG tablet Take 1 tablet by mouth 2 (two) times daily as needed.     metFORMIN (GLUCOPHAGE) 500 MG tablet Take by mouth 2 (two) times daily with a meal.     metoprolol tartrate (LOPRESSOR) 50 MG tablet Take 50 mg by mouth 2 (two) times daily.  6   nitroGLYCERIN (NITROSTAT) 0.4 MG SL tablet Place 1 tablet (0.4 mg total) under the tongue every 5 (five) minutes x 3 doses as needed for chest pain. 25 tablet 2   pantoprazole (PROTONIX) 40 MG tablet TAKE 1 TABLET BY MOUTH ONCE DAILY (STOP NEXIUM ESOMEPRAZOLE)  3   promethazine (PHENERGAN) 25 MG tablet TAKE 1 TABLET BY MOUTH 2 TO 3 TIMES DAILY AS NEEDED FOR NAUSEA FOR VOMITING  99   VASCEPA 1 g capsule Take 2 g by mouth 2 (two) times daily.     aspirin EC 81 MG tablet Take  81 mg by mouth daily. (Patient not taking: Reported on 09/04/2020)     lovastatin (MEVACOR) 20 MG tablet Take 20 mg by mouth at bedtime. (Patient not taking: Reported on 09/04/2020)     No current facility-administered medications for this visit.    Allergies as of 09/04/2020 - Review Complete 09/04/2020  Allergen Reaction Noted   Latex Itching 11/30/2016   Sulfa antibiotics  11/29/2016    Family History  Problem Relation Age of Onset   CAD Father    CAD Brother     Social History   Socioeconomic History   Marital status: Married    Spouse name: Not on file   Number of children: Not on file   Years of education: Not on file   Highest education level: Not on file  Occupational History   Not on file  Tobacco Use   Smoking status: Former   Smokeless tobacco: Never  Vaping Use    Vaping Use: Never used  Substance and Sexual Activity   Alcohol use: No   Drug use: No   Sexual activity: Not on file  Other Topics Concern   Not on file  Social History Narrative   Not on file   Social Determinants of Health   Financial Resource Strain: Not on file  Food Insecurity: Not on file  Transportation Needs: Not on file  Physical Activity: Not on file  Stress: Not on file  Social Connections: Not on file  Intimate Partner Violence: Not on file    Subjective: Review of Systems  Constitutional:  Negative for chills and fever.  HENT:  Negative for congestion and hearing loss.   Eyes:  Negative for blurred vision and double vision.  Respiratory:  Negative for cough and shortness of breath.   Cardiovascular:  Negative for chest pain and palpitations.  Gastrointestinal:  Negative for abdominal pain, blood in stool, constipation, diarrhea, heartburn, melena and vomiting.  Genitourinary:  Negative for dysuria and urgency.  Musculoskeletal:  Negative for joint pain and myalgias.  Skin:  Negative for itching and rash.  Neurological:  Negative for dizziness and headaches.  Psychiatric/Behavioral:  Negative for depression. The patient is not nervous/anxious.       Objective: BP (!) 175/89   Pulse 88   Temp (!) 97.5 F (36.4 C) (Temporal)   Ht 5\' 4"  (1.626 m)   Wt 274 lb 9.6 oz (124.6 kg)   BMI 47.13 kg/m  Physical Exam Constitutional:      Appearance: Normal appearance.  HENT:     Head: Normocephalic and atraumatic.  Eyes:     Extraocular Movements: Extraocular movements intact.     Conjunctiva/sclera: Conjunctivae normal.  Cardiovascular:     Rate and Rhythm: Normal rate and regular rhythm.  Pulmonary:     Effort: Pulmonary effort is normal.     Breath sounds: Normal breath sounds.  Abdominal:     General: Bowel sounds are normal.     Palpations: Abdomen is soft.  Musculoskeletal:        General: No swelling. Normal range of motion.     Cervical back:  Normal range of motion and neck supple.  Skin:    General: Skin is warm and dry.     Coloration: Skin is not jaundiced.  Neurological:     General: No focal deficit present.     Mental Status: She is alert and oriented to person, place, and time.  Psychiatric:        Mood and Affect: Mood  normal.        Behavior: Behavior normal.     Assessment: *GERD-well-controlled on pantoprazole *Colon cancer screening *Morbid obesity  Plan: GERD well-controlled on daily pantoprazole.  We will continue.  Will schedule for screening colonoscopy.The risks including infection, bleed, or perforation as well as benefits, limitations, alternatives and imponderables have been reviewed with the patient. Questions have been answered. All parties agreeable.  Patient to work on weight loss.   09/04/2020 12:08 PM   Disclaimer: This note was dictated with voice recognition software. Similar sounding words can inadvertently be transcribed and may not be corrected upon review.

## 2020-09-04 NOTE — Telephone Encounter (Signed)
Spoke with pt. She has been scheduled for TCS with propofol, asa 3 on 8/50mon at 8:15am. Aware will mail prep instructions with pre-op appt. Confirmed address. Confirmed pharmacy.

## 2020-09-04 NOTE — Patient Instructions (Signed)
We will schedule you for colonoscopy for colon cancer screening purposes.  Further recommendations to follow.  For your chronic reflux continue on pantoprazole daily.    At [facility] we value your feedback. You may receive a survey about your visit today. Please share your experience as we strive to create trusting relationships with our patients to provide genuine, compassionate, quality care.  We appreciate your understanding and patience as we review any laboratory studies, imaging, and other diagnostic tests that are ordered as we care for you. Our office policy is 5 business days for review of these results, and any emergent or urgent results are addressed in a timely manner for your best interest. If you do not hear from our office in 1 week, please contact us.   We also encourage the use of MyChart, which contains your medical information for your review as well. If you are not enrolled in this feature, an access code is on this after visit summary for your convenience. Thank you for allowing [place] to be involved in your care.  It was great to see you today!  I hope you have a great rest of your summer!!    [place]on Alas. Abbey Chatters, D.O. Gastroenterology and Hepatology [facility]

## 2020-09-05 ENCOUNTER — Encounter: Payer: Self-pay | Admitting: *Deleted

## 2020-09-25 NOTE — Patient Instructions (Signed)
[facility]  09/25/2020     '@PREFPERIOPPHARMACY'$ @   Your procedure is scheduled on  10/06/2020.   Report to [facility] at  [phone]  A.M.   Call this number if you have problems the morning of surgery:  [phone]   Remember:  Follow the diet and prep instructions given to you by the office.           Take 9 units of Levemir the night before your procedure.            DO NOT take any medications for diabetes the morning of your procedure.          Use your inhalers before you come and bring your rescue inhaler with you.      Take these medicines the morning of surgery with A SIP OF WATER     gabapentin,hydroxyzine,metoprolol, protonix, vascepa.     Do not wear jewelry, make-up or nail polish.  Do not wear lotions, powders, or perfumes, or deodorant.  Do not shave 48 hours prior to surgery.  Men may shave face and neck.  Do not bring valuables to the hospital.  [facility] is not responsible for any belongings or valuables.  Contacts, dentures or bridgework may not be worn into surgery.  Leave your suitcase in the car.  After surgery it may be brought to your room.  For patients admitted to the hospital, discharge time will be determined by your treatment team.  Patients discharged the day of surgery will not be allowed to drive home and must have someone with them for 24 hours.    Special instructions:  DO NOT smoke tobacco or vape for 24 hours before your procedure.  Please read over the following fact sheets that you were given. Anesthesia Post-op Instructions and Care and Recovery After Surgery      Colonoscopy, Adult, Care After This sheet gives you information about how to care for yourself after your procedure. Your health care provider may also give you more specific instructions. If you have problems or questions, contact your health careprovider. What can I expect after the procedure? After the procedure, it is common to have: A small amount of  blood in your stool for 24 hours after the procedure. Some gas. Mild cramping or bloating of your abdomen. Follow these instructions at home: Eating and drinking  Drink enough fluid to keep your urine pale yellow. Follow instructions from your health care provider about eating or drinking restrictions. Resume your normal diet as instructed by your health care provider. Avoid heavy or fried foods that are hard to digest.  Activity Rest as told by your health care provider. Avoid sitting for a long time without moving. Get up to take short walks every 1-2 hours. This is important to improve blood flow and breathing. Ask for help if you feel weak or unsteady. Return to your normal activities as told by your health care provider. Ask your health care provider what activities are safe for you. Managing cramping and bloating  Try walking around when you have cramps or feel bloated. Apply heat to your abdomen as told by your health care provider. Use the heat source that your health care provider recommends, such as a moist heat pack or a heating pad. Place a towel between your skin and the heat source. Leave the heat on for 20-30 minutes. Remove the heat if your skin turns bright red. This is especially important if you  are unable to feel pain, heat, or cold. You may have a greater risk of getting burned.  General instructions If you were given a sedative during the procedure, it can affect you for several hours. Do not drive or operate machinery until your health care provider says that it is safe. For the first 24 hours after the procedure: Do not sign important documents. Do not drink alcohol. Do your regular daily activities at a slower pace than normal. Eat soft foods that are easy to digest. Take over-the-counter and prescription medicines only as told by your health care provider. Keep all follow-up visits as told by your health care provider. This is important. Contact a health care  provider if: You have blood in your stool 2-3 days after the procedure. Get help right away if you have: More than a small spotting of blood in your stool. Large blood clots in your stool. Swelling of your abdomen. Nausea or vomiting. A fever. Increasing pain in your abdomen that is not relieved with medicine. Summary After the procedure, it is common to have a small amount of blood in your stool. You may also have mild cramping and bloating of your abdomen. If you were given a sedative during the procedure, it can affect you for several hours. Do not drive or operate machinery until your health care provider says that it is safe. Get help right away if you have a lot of blood in your stool, nausea or vomiting, a fever, or increased pain in your abdomen. This information is not intended to replace advice given to you by your health care provider. Make sure you discuss any questions you have with your healthcare provider. Document Revised: 02/02/2019 Document Reviewed: 09/04/2018 Elsevier Patient Education  [place] After This sheet gives you information about how to care for yourself after your procedure. Your health care provider may also give you more specific instructions. If you have problems or questions, contact your health careprovider. What can I expect after the procedure? After the procedure, it is common to have: Tiredness. Forgetfulness about what happened after the procedure. Impaired judgment for important decisions. Nausea or vomiting. Some difficulty with balance. Follow these instructions at home: For the time period you were told by your health care provider:     Rest as needed. Do not participate in activities where you could fall or become injured. Do not drive or use machinery. Do not drink alcohol. Do not take sleeping pills or medicines that cause drowsiness. Do not make important decisions or sign legal  documents. Do not take care of children on your own. Eating and drinking Follow the diet that is recommended by your health care provider. Drink enough fluid to keep your urine pale yellow. If you vomit: Drink water, juice, or soup when you can drink without vomiting. Make sure you have little or no nausea before eating solid foods. General instructions Have a responsible adult stay with you for the time you are told. It is important to have someone help care for you until you are awake and alert. Take over-the-counter and prescription medicines only as told by your health care provider. If you have sleep apnea, surgery and certain medicines can increase your risk for breathing problems. Follow instructions from your health care provider about wearing your sleep device: Anytime you are sleeping, including during daytime naps. While taking prescription pain medicines, sleeping medicines, or medicines that make you drowsy. Avoid smoking. Keep all follow-up visits  as told by your health care provider. This is important. Contact a health care provider if: You keep feeling nauseous or you keep vomiting. You feel light-headed. You are still sleepy or having trouble with balance after 24 hours. You develop a rash. You have a fever. You have redness or swelling around the IV site. Get help right away if: You have trouble breathing. You have new-onset confusion at home. Summary For several hours after your procedure, you may feel tired. You may also be forgetful and have poor judgment. Have a responsible adult stay with you for the time you are told. It is important to have someone help care for you until you are awake and alert. Rest as told. Do not drive or operate machinery. Do not drink alcohol or take sleeping pills. Get help right away if you have trouble breathing, or if you suddenly become confused. This information is not intended to replace advice given to you by your health care  provider. Make sure you discuss any questions you have with your healthcare provider. Document Revised: 10/25/2019 Document Reviewed: 01/11/2019 Elsevier Patient Education  2022 [employer].

## 2020-10-02 ENCOUNTER — Encounter (HOSPITAL_COMMUNITY): Payer: Self-pay

## 2020-10-02 ENCOUNTER — Other Ambulatory Visit: Payer: Self-pay

## 2020-10-02 ENCOUNTER — Encounter (HOSPITAL_COMMUNITY)
Admission: RE | Admit: 2020-10-02 | Discharge: 2020-10-02 | Disposition: A | Discharge status: Home / Self Care | Payer: Medicare HMO | Source: Ambulatory Visit | Attending: Internal Medicine | Admitting: Internal Medicine

## 2020-10-02 DIAGNOSIS — Z01818 Encounter for other preprocedural examination: Secondary | ICD-10-CM | POA: Diagnosis not present

## 2020-10-02 HISTORY — DX: Nausea with vomiting, unspecified: Z98.890

## 2020-10-02 HISTORY — DX: Other specified postprocedural states: R11.2

## 2020-10-02 LAB — BASIC METABOLIC PANEL
Anion gap: 9 (ref 5–15)
BUN: 22 mg/dL (ref 8–23)
CO2: 26 mmol/L (ref 22–32)
Calcium: 9.5 mg/dL (ref 8.9–10.3)
Chloride: 101 mmol/L (ref 98–111)
Creatinine, Ser: 1.08 mg/dL — ABNORMAL HIGH (ref 0.44–1.00)
GFR, Estimated: 56 mL/min — ABNORMAL LOW (ref >60)
Glucose, Bld: 158 mg/dL — ABNORMAL HIGH (ref 70–99)
Potassium: 3.6 mmol/L (ref 3.5–5.1)
Sodium: 136 mmol/L (ref 135–145)

## 2020-10-02 NOTE — Progress Notes (Signed)
   10/02/20 1009  OBSTRUCTIVE SLEEP APNEA  Have you ever been diagnosed with sleep apnea through a sleep study? No  Do you snore loudly (loud enough to be heard through closed doors)?  1  Do you often feel tired, fatigued, or sleepy during the daytime (such as falling asleep during driving or talking to someone)? 0  Has anyone observed you stop breathing during your sleep? 0  Do you have, or are you being treated for high blood pressure? 1  BMI more than 35 kg/m2? 1  Age > 101 (1-yes) 1  Female Gender (Yes=1) 0  Obstructive Sleep Apnea Score 4  Score 5 or greater  Results sent to PCP

## 2020-10-06 ENCOUNTER — Ambulatory Visit (HOSPITAL_COMMUNITY)
Admission: RE | Admit: 2020-10-06 | Discharge: 2020-10-06 | Disposition: A | Discharge status: Home / Self Care | Payer: Medicare HMO | Attending: Internal Medicine | Admitting: Internal Medicine

## 2020-10-06 ENCOUNTER — Ambulatory Visit (HOSPITAL_COMMUNITY): Payer: Medicare HMO | Admitting: Anesthesiology

## 2020-10-06 ENCOUNTER — Encounter (HOSPITAL_COMMUNITY): Payer: Self-pay

## 2020-10-06 ENCOUNTER — Encounter (HOSPITAL_COMMUNITY)
Admission: RE | Disposition: A | Discharge status: Home / Self Care | Payer: Self-pay | Source: Home / Self Care | Attending: Internal Medicine

## 2020-10-06 ENCOUNTER — Other Ambulatory Visit: Payer: Self-pay

## 2020-10-06 DIAGNOSIS — Z882 Allergy status to sulfonamides status: Secondary | ICD-10-CM | POA: Insufficient documentation

## 2020-10-06 DIAGNOSIS — Z7982 Long term (current) use of aspirin: Secondary | ICD-10-CM | POA: Diagnosis not present

## 2020-10-06 DIAGNOSIS — D123 Benign neoplasm of transverse colon: Secondary | ICD-10-CM

## 2020-10-06 DIAGNOSIS — Z7951 Long term (current) use of inhaled steroids: Secondary | ICD-10-CM | POA: Diagnosis not present

## 2020-10-06 DIAGNOSIS — Z7984 Long term (current) use of oral hypoglycemic drugs: Secondary | ICD-10-CM | POA: Diagnosis not present

## 2020-10-06 DIAGNOSIS — Z79899 Other long term (current) drug therapy: Secondary | ICD-10-CM | POA: Diagnosis not present

## 2020-10-06 DIAGNOSIS — K648 Other hemorrhoids: Secondary | ICD-10-CM | POA: Insufficient documentation

## 2020-10-06 DIAGNOSIS — D125 Benign neoplasm of sigmoid colon: Secondary | ICD-10-CM | POA: Diagnosis not present

## 2020-10-06 DIAGNOSIS — K635 Polyp of colon: Secondary | ICD-10-CM | POA: Diagnosis not present

## 2020-10-06 DIAGNOSIS — K621 Rectal polyp: Secondary | ICD-10-CM | POA: Insufficient documentation

## 2020-10-06 DIAGNOSIS — Z87891 Personal history of nicotine dependence: Secondary | ICD-10-CM | POA: Diagnosis not present

## 2020-10-06 DIAGNOSIS — Z1211 Encounter for screening for malignant neoplasm of colon: Secondary | ICD-10-CM

## 2020-10-06 DIAGNOSIS — Z794 Long term (current) use of insulin: Secondary | ICD-10-CM | POA: Insufficient documentation

## 2020-10-06 DIAGNOSIS — J449 Chronic obstructive pulmonary disease, unspecified: Secondary | ICD-10-CM | POA: Diagnosis not present

## 2020-10-06 DIAGNOSIS — Z91040 Latex allergy status: Secondary | ICD-10-CM | POA: Insufficient documentation

## 2020-10-06 HISTORY — DX: Cerebral infarction, unspecified: I63.9

## 2020-10-06 HISTORY — PX: COLONOSCOPY WITH PROPOFOL: SHX5780

## 2020-10-06 HISTORY — PX: POLYPECTOMY: SHX149

## 2020-10-06 LAB — GLUCOSE, CAPILLARY: Glucose-Capillary: 162 mg/dL — ABNORMAL HIGH (ref 70–99)

## 2020-10-06 SURGERY — COLONOSCOPY WITH PROPOFOL
Anesthesia: General

## 2020-10-06 MED ORDER — STERILE WATER FOR IRRIGATION IR SOLN
Status: DC | PRN
  Administered 2020-10-06: 200 mL

## 2020-10-06 MED ORDER — PROPOFOL 10 MG/ML IV BOLUS
INTRAVENOUS | Status: DC | PRN
  Administered 2020-10-06: 100 ug/kg/min via INTRAVENOUS
  Administered 2020-10-06: 80 mg via INTRAVENOUS

## 2020-10-06 MED ORDER — LACTATED RINGERS IV SOLN: INTRAVENOUS | Status: DC

## 2020-10-06 NOTE — Anesthesia Postprocedure Evaluation (Signed)
Anesthesia Post Note  Patient: [name]  Procedure(s) Performed: COLONOSCOPY WITH PROPOFOL POLYPECTOMY INTESTINAL  Patient location during evaluation: Phase II Anesthesia Type: General Level of consciousness: awake and alert and oriented Pain management: pain level controlled Vital Signs Assessment: post-procedure vital signs reviewed and stable Respiratory status: spontaneous breathing and respiratory function stable Cardiovascular status: blood pressure returned to baseline and stable Postop Assessment: no apparent nausea or vomiting Anesthetic complications: no   No notable events documented.   Last Vitals:  Vitals:   10/06/20 0730 10/06/20 0806  BP:  (!) 98/52  Pulse:    Resp:  20  Temp:  36.5 C  SpO2: 93% 93%    Last Pain:  Vitals:   10/06/20 0806  TempSrc: Oral  PainSc: 0-No pain                 Lexani Corona C Ashlen Kiger

## 2020-10-06 NOTE — Transfer of Care (Signed)
Immediate Anesthesia Transfer of Care Note  Patient: [name]  Procedure(s) Performed: COLONOSCOPY WITH PROPOFOL POLYPECTOMY INTESTINAL  Patient Location: Short Stay  Anesthesia Type:General  Level of Consciousness: awake, alert , oriented and patient cooperative  Airway & Oxygen Therapy: Patient Spontanous Breathing and Patient connected to nasal cannula oxygen  Post-op Assessment: Report given to RN, Post -op Vital signs reviewed and stable and Patient moving all extremities X 4  Post vital signs: Reviewed and stable  Last Vitals:  Vitals Value Taken Time  BP    Temp    Pulse    Resp    SpO2      Last Pain:  Vitals:   10/06/20 0744  TempSrc:   PainSc: 5          Complications: No notable events documented.

## 2020-10-06 NOTE — Anesthesia Preprocedure Evaluation (Signed)
Anesthesia Evaluation  Patient identified by MRN, date of birth, ID band Patient awake    Reviewed: Allergy & Precautions, NPO status , Patient's Chart, lab work & pertinent test results, reviewed documented beta blocker date and time   History of Anesthesia Complications (+) PONV and history of anesthetic complications  Airway Mallampati: II  TM Distance: >3 FB Neck ROM: Full    Dental  (+) Edentulous Upper, Edentulous Lower   Pulmonary shortness of breath and with exertion, COPD, former smoker,  Room air sat 90 to 92   Pulmonary exam normal breath sounds clear to auscultation       Cardiovascular Exercise Tolerance: Good hypertension, Pt. on medications and Pt. on home beta blockers Normal cardiovascular exam Rhythm:Regular Rate:Normal     Neuro/Psych PSYCHIATRIC DISORDERS Bipolar Disorder CVA, Residual Symptoms    GI/Hepatic Neg liver ROS, GERD  Medicated and Controlled,  Endo/Other  diabetes, Well Controlled, Type 2, Insulin DependentMorbid obesity  Renal/GU      Musculoskeletal   Abdominal   Peds  Hematology   Anesthesia Other Findings   Reproductive/Obstetrics                            Anesthesia Physical Anesthesia Plan  ASA: 3  Anesthesia Plan: General   Post-op Pain Management:    Induction:   PONV Risk Score and Plan:   Airway Management Planned:   Additional Equipment:   Intra-op Plan:   Post-operative Plan:   Informed Consent: I have reviewed the patients History and Physical, chart, labs and discussed the procedure including the risks, benefits and alternatives for the proposed anesthesia with the patient or authorized representative who has indicated his/her understanding and acceptance.       Plan Discussed with: CRNA and Surgeon  Anesthesia Plan Comments:         Anesthesia Quick Evaluation

## 2020-10-06 NOTE — Discharge Instructions (Addendum)
  Colonoscopy Discharge Instructions  Read the instructions outlined below and refer to this sheet in the next few weeks. These discharge instructions provide you with general information on caring for yourself after you leave the hospital. Your doctor may also give you specific instructions. While your treatment has been planned according to the most current medical practices available, unavoidable complications occasionally occur.   ACTIVITY You may resume your regular activity, but move at a slower pace for the next 24 hours.  Take frequent rest periods for the next 24 hours.  Walking will help get rid of the air and reduce the bloated feeling in your belly (abdomen).  No driving for 24 hours (because of the medicine (anesthesia) used during the test).   Do not sign any important legal documents or operate any machinery for 24 hours (because of the anesthesia used during the test).  NUTRITION Drink plenty of fluids.  You may resume your normal diet as instructed by your doctor.  Begin with a light meal and progress to your normal diet. Heavy or fried foods are harder to digest and may make you feel sick to your stomach (nauseated).  Avoid alcoholic beverages for 24 hours or as instructed.  MEDICATIONS You may resume your normal medications unless your doctor tells you otherwise.  WHAT YOU CAN EXPECT TODAY Some feelings of bloating in the abdomen.  Passage of more gas than usual.  Spotting of blood in your stool or on the toilet paper.  IF YOU HAD POLYPS REMOVED DURING THE COLONOSCOPY: No aspirin products for 7 days or as instructed.  No alcohol for 7 days or as instructed.  Eat a soft diet for the next 24 hours.  FINDING OUT THE RESULTS OF YOUR TEST Not all test results are available during your visit. If your test results are not back during the visit, make an appointment with your caregiver to find out the results. Do not assume everything is normal if you have not heard from your  caregiver or the medical facility. It is important for you to follow up on all of your test results.  SEEK IMMEDIATE MEDICAL ATTENTION IF: You have more than a spotting of blood in your stool.  Your belly is swollen (abdominal distention).  You are nauseated or vomiting.  You have a temperature over 101.  You have abdominal pain or discomfort that is severe or gets worse throughout the day.   Your colonoscopy revealed 2 polyp(s) which I removed successfully. Await pathology results, my office will contact you. I recommend repeating colonoscopy in 5 years for surveillance purposes. Otherwise follow up with GI as needed.    I hope you have a great rest of your week!  Charles K. Carver, D.O. Gastroenterology and Hepatology [facility]  

## 2020-10-06 NOTE — H&P (Signed)
Primary Care Physician:  [name], MD Primary Gastroenterologist:  Dr. Abbey Chatters  Pre-Procedure History & Physical: HPI:  [name] is a 70 y.o. female is here for a colonoscopy for colon cancer screening purposes. Last TCS 2006/2007, unremarkable per patient.  Patient denies any family history of colorectal cancer.  No melena or hematochezia.  No abdominal pain or unintentional weight loss.  No change in bowel habits.  Overall feels well from a GI standpoint.  Past Medical History:  Diagnosis Date   Bipolar 1 disorder ([place])    Cancer ([place])    Diabetes mellitus without complication ([place])    Hypertension    PONV (postoperative nausea and vomiting)    Stroke [facility])     Past Surgical History:  Procedure Laterality Date   ABDOMINAL HYSTERECTOMY     CHOLECYSTECTOMY     ORIF FEMUR FRACTURE Left 11/30/2016   Procedure: OPEN REDUCTION INTERNAL FIXATION (ORIF) DISTAL FEMUR FRACTURE;  Surgeon: Rod Can, MD;  Location: [facility];  Service: Orthopedics;  Laterality: Left;   stroke     TONSILLECTOMY      Prior to Admission medications   Medication Sig Start Date End Date Taking? Authorizing Provider  albuterol (PROVENTIL HFA;VENTOLIN HFA) 108 (90 Base) MCG/ACT inhaler Inhale 2 puffs into the lungs every 8 (eight) hours as needed for shortness of breath or wheezing. 12/13/17  Yes [provider]  aspirin EC 81 MG tablet Take 81 mg by mouth daily.   Yes [provider]  budesonide-formoterol (SYMBICORT) 160-4.5 MCG/ACT inhaler Inhale 2 puffs into the lungs 2 (two) times daily.   Yes [provider]  BYDUREON 2 MG PEN Inject 2 mg as directed every Tuesday. 10/18/17  Yes [provider]  gabapentin (NEURONTIN) 100 MG capsule Take 100 mg by mouth 2 (two) times daily. 06/12/20  Yes [provider]  hydrochlorothiazide (HYDRODIURIL) 25 MG tablet Take 25 mg by mouth daily. 11/06/17  Yes [provider]  Ibuprofen-Acetaminophen (ADVIL DUAL  ACTION) 125-250 MG TABS Take 2 tablets by mouth every 8 (eight) hours as needed (pain).   Yes [provider]  insulin detemir (LEVEMIR) 100 UNIT/ML injection Inject 0.18 mLs (18 Units total) into the skin at bedtime. 04/27/18  Yes Strader, [place] M, PA-C  lisinopril (ZESTRIL) 40 MG tablet Take 40 mg by mouth daily.   Yes [provider]  meclizine (ANTIVERT) 25 MG tablet Take 25 mg by mouth 2 (two) times daily as needed for dizziness. 03/16/18  Yes [provider]  metFORMIN (GLUCOPHAGE) 500 MG tablet Take 500 mg by mouth 2 (two) times daily with a meal.   Yes [provider]  metoprolol tartrate (LOPRESSOR) 50 MG tablet Take 50 mg by mouth 2 (two) times daily. 08/30/17  Yes [provider]  montelukast (SINGULAIR) 10 MG tablet Take 10 mg by mouth at bedtime.   Yes [provider]  nitroGLYCERIN (NITROSTAT) 0.4 MG SL tablet Place 1 tablet (0.4 mg total) under the tongue every 5 (five) minutes x 3 doses as needed for chest pain. 04/27/18  Yes Strader, [place], PA-C  pantoprazole (PROTONIX) 40 MG tablet Take 40 mg by mouth daily. 11/28/17  Yes [provider]  promethazine (PHENERGAN) 25 MG tablet Take 25 mg by mouth 3 (three) times daily as needed for nausea or vomiting. 12/02/17  Yes [provider]  sodium chloride (OCEAN) 0.65 % SOLN nasal spray Place 1 spray into both nostrils as needed for congestion.   Yes [provider]  VASCEPA 1 g capsule Take 2 g by mouth 2 (two) times daily. 06/17/20  Yes [provider]  polyethylene glycol-electrolytes (NULYTELY) 420 g solution As directed 09/04/20   Eloise Harman, DO    Allergies as of 09/04/2020 - Review Complete 09/04/2020  Allergen Reaction Noted   Latex Itching 11/30/2016   Sulfa antibiotics  11/29/2016    Family History  Problem Relation Age of Onset   CAD Father    CAD Brother     Social History   Socioeconomic History   Marital status: Married     Spouse name: Not on file   Number of children: Not on file   Years of education: Not on file   Highest education level: Not on file  Occupational History   Not on file  Tobacco Use   Smoking status: Former    Packs/day: 3.00    Years: 20.00    Pack years: 60.00    Types: Cigarettes    Quit date: 09/26/2000    Years since quitting: 20.0   Smokeless tobacco: Never  Vaping Use   Vaping Use: Never used  Substance and Sexual Activity   Alcohol use: No   Drug use: No   Sexual activity: Not Currently  Other Topics Concern   Not on file  Social History Narrative   Not on file   Social Determinants of Health   Financial Resource Strain: Not on file  Food Insecurity: Not on file  Transportation Needs: Not on file  Physical Activity: Not on file  Stress: Not on file  Social Connections: Not on file  Intimate Partner Violence: Not on file    Review of Systems: See HPI, otherwise negative ROS  Physical Exam: Vital signs in last 24 hours: Temp:  [98.7 F (37.1 C)] 98.7 F (37.1 C) (08/15 0720) Pulse Rate:  [85] 85 (08/15 0720) BP: (130)/(64) 130/64 (08/15 0720) SpO2:  [90 %] 90 % (08/15 0720)   General:   Alert,  Well-developed, well-nourished, pleasant and cooperative in NAD, Obese Head:  Normocephalic and atraumatic. Eyes:  Sclera clear, no icterus.   Conjunctiva pink. Ears:  Normal auditory acuity. Nose:  No deformity, discharge,  or lesions. Mouth:  No deformity or lesions, dentition normal. Neck:  Supple; no masses or thyromegaly. Lungs:  Clear throughout to auscultation.   No wheezes, crackles, or rhonchi. No acute distress. Heart:  Regular rate and rhythm; no murmurs, clicks, rubs,  or gallops. Abdomen:  Soft, nontender and nondistended. No masses, hepatosplenomegaly or hernias noted. Normal bowel sounds, without guarding, and without rebound.   Msk:  Symmetrical without gross deformities. Normal posture. Extremities:  Without clubbing or edema. Neurologic:   Alert and  oriented x4;  grossly normal neurologically. Skin:  Intact without significant lesions or rashes. Cervical Nodes:  No significant cervical adenopathy. Psych:  Alert and cooperative. Normal mood and affect.  Impression/Plan: [name] is here for a colonoscopy to be performed for colon cancer screening purposes.  The risks of the procedure including infection, bleed, or perforation as well as benefits, limitations, alternatives and imponderables have been reviewed with the patient. Questions have been answered. All parties agreeable.

## 2020-10-06 NOTE — Op Note (Signed)
[facility] Patient Name: [name] Procedure Date: 10/06/2020 7:03 AM MRN: [id number] Date of Birth: September 16, 1950 Attending MD: Elon Alas. Abbey Chatters DO CSN: [id number] Age: 70 Admit Type: Outpatient Procedure:                Colonoscopy Indications:              Screening for colorectal malignant neoplasm Providers:                Elon Alas. Abbey Chatters, DO, [place] Page, [place]                            Risa Grill, Technician, Aram Candela Referring MD:              Medicines:                See the Anesthesia note for documentation of the                            administered medications Complications:            No immediate complications. Estimated Blood Loss:     Estimated blood loss was minimal. Procedure:                Pre-Anesthesia Assessment:                           - The anesthesia plan was to use monitored                            anesthesia care (MAC).                           After obtaining informed consent, the colonoscope                            was passed under direct vision. Throughout the                            procedure, the patient's blood pressure, pulse, and                            oxygen saturations were monitored continuously. The                            PCF-HQ190L ([id number]) scope was introduced through                            the anus and advanced to the the cecum, identified                            by appendiceal orifice and ileocecal valve. The                            colonoscopy was performed without difficulty. The                            patient tolerated the  procedure well. The quality                            of the bowel preparation was evaluated using the                            BBPS [facility] Bowel Preparation Scale) with scores                            of: Right Colon = 3, Transverse Colon = 3 and Left                            Colon = 3 (entire mucosa seen well with no residual                             staining, small fragments of stool or opaque                            liquid). The total BBPS score equals 9. Scope In: 7:49:34 AM Scope Out: 8:03:39 AM Scope Withdrawal Time: 0 hours 10 minutes 16 seconds  Total Procedure Duration: 0 hours 14 minutes 5 seconds  Findings:      The perianal and digital rectal examinations were normal.      Non-bleeding internal hemorrhoids were found during endoscopy.      A 5 mm polyp was found in the transverse colon. The polyp was sessile.       The polyp was removed with a cold snare. Resection and retrieval were       complete.      A 3 mm polyp was found in the sigmoid colon. The polyp was sessile. The       polyp was removed with a cold snare. Resection and retrieval were       complete.      The exam was otherwise without abnormality. Impression:               - Non-bleeding internal hemorrhoids.                           - One 5 mm polyp in the transverse colon, removed                            with a cold snare. Resected and retrieved.                           - One 3 mm polyp in the sigmoid colon, removed with                            a cold snare. Resected and retrieved.                           - The examination was otherwise normal. Moderate Sedation:      Per Anesthesia Care Recommendation:           - Patient has a contact number available for  emergencies. The signs and symptoms of potential                            delayed complications were discussed with the                            patient. Return to normal activities tomorrow.                            Written discharge instructions were provided to the                            patient.                           - Resume previous diet.                           - Continue present medications.                           - Await pathology results.                           - Repeat colonoscopy in 5 years for surveillance.                            - Return to GI clinic PRN. Procedure Code(s):        --- Professional ---                           [phone], Colonoscopy, flexible; with removal of                            tumor(s), polyp(s), or other lesion(s) by snare                            technique Diagnosis Code(s):        --- Professional ---                           Z12.11, Encounter for screening for malignant                            neoplasm of colon                           K63.5, Polyp of colon                           K64.8, Other hemorrhoids CPT copyright 2019 American [employer] Association. All rights reserved. The codes documented in this report are preliminary and upon coder review may  be revised to meet current compliance requirements. Elon Alas. Abbey Chatters, DO [place] Abbey Chatters, DO 10/06/2020 8:11:23 AM This report has been signed electronically. Number of Addenda: 0

## 2020-10-07 LAB — SURGICAL PATHOLOGY

## 2020-10-13 ENCOUNTER — Encounter (HOSPITAL_COMMUNITY): Payer: Self-pay | Admitting: Internal Medicine

## 2020-10-16 DIAGNOSIS — E119 Type 2 diabetes mellitus without complications: Secondary | ICD-10-CM | POA: Diagnosis not present

## 2020-10-16 DIAGNOSIS — E785 Hyperlipidemia, unspecified: Secondary | ICD-10-CM | POA: Diagnosis not present

## 2020-10-16 DIAGNOSIS — I10 Essential (primary) hypertension: Secondary | ICD-10-CM | POA: Diagnosis not present

## 2020-10-20 ENCOUNTER — Telehealth: Payer: Self-pay | Admitting: Internal Medicine

## 2020-10-20 NOTE — Telephone Encounter (Signed)
Pt was checking to see if her results from her procedure were back yet. [phone]

## 2020-10-20 NOTE — Telephone Encounter (Signed)
Returned the pt's call and advised her of her pathology report. Also the pt has some issues she wanted you to know about every since she had her colonoscopy she has had nothing but diarrhea (liquid stools). She states but starting 3 days ago she hasn't had a bowel movement. She also stated a constant right side pain that she said she told you about on her 1st visit with you. She stated that you said "you were not going to put it down because you wanted to see what her colonoscopy report says and that she also told you about upper abdomen pain that you were not going to put down") I advised the pt that I would send you a note regarding all of this. I also asked the pt why did she wait until a month later to tell us about the diarrhea and she said she just thought about it when I returned her call. Please advise.

## 2020-10-22 DIAGNOSIS — I10 Essential (primary) hypertension: Secondary | ICD-10-CM | POA: Diagnosis not present

## 2020-10-22 DIAGNOSIS — K219 Gastro-esophageal reflux disease without esophagitis: Secondary | ICD-10-CM | POA: Diagnosis not present

## 2020-10-22 DIAGNOSIS — R42 Dizziness and giddiness: Secondary | ICD-10-CM | POA: Diagnosis not present

## 2020-10-22 DIAGNOSIS — J449 Chronic obstructive pulmonary disease, unspecified: Secondary | ICD-10-CM | POA: Diagnosis not present

## 2020-10-22 DIAGNOSIS — M810 Age-related osteoporosis without current pathological fracture: Secondary | ICD-10-CM | POA: Diagnosis not present

## 2020-10-22 DIAGNOSIS — E785 Hyperlipidemia, unspecified: Secondary | ICD-10-CM | POA: Diagnosis not present

## 2020-10-22 DIAGNOSIS — E119 Type 2 diabetes mellitus without complications: Secondary | ICD-10-CM | POA: Diagnosis not present

## 2020-10-22 DIAGNOSIS — Z7984 Long term (current) use of oral hypoglycemic drugs: Secondary | ICD-10-CM | POA: Diagnosis not present

## 2020-10-29 ENCOUNTER — Telehealth: Payer: Self-pay | Admitting: Internal Medicine

## 2020-10-29 MED ORDER — DICYCLOMINE HCL 10 MG PO CAPS: 10.0000 mg | ORAL_CAPSULE | Freq: Three times a day (TID) | ORAL | 5 refills | Status: DC

## 2020-10-29 NOTE — Telephone Encounter (Signed)
I called and spoke with patient.  Diarrhea has resolved.  She is still having abdominal pain.  I will trial her on dicyclomine and we will send this to her pharmacy.  Please schedule follow-up appointment for patient with the apps or myself.  Thank you

## 2020-10-29 NOTE — Telephone Encounter (Signed)
PLEASE CALL PATIENT, SHE STATES SHE IS HAVING BAD SIDE PAIN    SAME SYMPTOMS BEFORE SHE HAD HER PROCEDURE

## 2020-10-29 NOTE — Telephone Encounter (Signed)
Returned the pt's call and advised her of her pathology report. Also the pt has some issues she wanted you to know about every since she had her colonoscopy she has had nothing but diarrhea (liquid stools). She states but starting 3 days ago she hasn't had a bowel movement. She also stated a constant right side pain that she said she told you about on her 1st visit with you. She stated that you said "you were not going to put it down because you wanted to see what her colonoscopy report says and that she also told you about upper abdomen pain that you were not going to put down") I advised the pt that I would send you a note regarding all of this. I also asked the pt why did she wait until a month later to tell us about the diarrhea and she said she just thought about it when I returned her call. Please advise.

## 2020-10-30 ENCOUNTER — Encounter: Payer: Self-pay | Admitting: Internal Medicine

## 2020-10-30 NOTE — Telephone Encounter (Signed)
Per Dr. Abbey Chatters Please schedule appt for the pt with apps or Dr. Abbey Chatters

## 2020-11-08 ENCOUNTER — Emergency Department (HOSPITAL_COMMUNITY)
Admission: EM | Admit: 2020-11-08 | Discharge: 2020-11-08 | Disposition: A | Discharge status: Home / Self Care | Payer: Medicare HMO | Attending: Student | Admitting: Student

## 2020-11-08 ENCOUNTER — Emergency Department (HOSPITAL_COMMUNITY): Payer: Medicare HMO

## 2020-11-08 ENCOUNTER — Encounter (HOSPITAL_COMMUNITY): Payer: Self-pay | Admitting: Emergency Medicine

## 2020-11-08 DIAGNOSIS — E119 Type 2 diabetes mellitus without complications: Secondary | ICD-10-CM | POA: Diagnosis not present

## 2020-11-08 DIAGNOSIS — Z79899 Other long term (current) drug therapy: Secondary | ICD-10-CM | POA: Insufficient documentation

## 2020-11-08 DIAGNOSIS — S79911A Unspecified injury of right hip, initial encounter: Secondary | ICD-10-CM | POA: Diagnosis not present

## 2020-11-08 DIAGNOSIS — Z91040 Latex allergy status: Secondary | ICD-10-CM | POA: Diagnosis not present

## 2020-11-08 DIAGNOSIS — M25551 Pain in right hip: Secondary | ICD-10-CM | POA: Diagnosis present

## 2020-11-08 DIAGNOSIS — Z7982 Long term (current) use of aspirin: Secondary | ICD-10-CM | POA: Diagnosis not present

## 2020-11-08 DIAGNOSIS — M5431 Sciatica, right side: Secondary | ICD-10-CM | POA: Insufficient documentation

## 2020-11-08 DIAGNOSIS — R52 Pain, unspecified: Secondary | ICD-10-CM | POA: Diagnosis not present

## 2020-11-08 DIAGNOSIS — M5441 Lumbago with sciatica, right side: Secondary | ICD-10-CM | POA: Diagnosis not present

## 2020-11-08 DIAGNOSIS — Z794 Long term (current) use of insulin: Secondary | ICD-10-CM | POA: Diagnosis not present

## 2020-11-08 DIAGNOSIS — Z859 Personal history of malignant neoplasm, unspecified: Secondary | ICD-10-CM | POA: Diagnosis not present

## 2020-11-08 DIAGNOSIS — W19XXXA Unspecified fall, initial encounter: Secondary | ICD-10-CM | POA: Diagnosis not present

## 2020-11-08 DIAGNOSIS — Z87891 Personal history of nicotine dependence: Secondary | ICD-10-CM | POA: Insufficient documentation

## 2020-11-08 DIAGNOSIS — M8588 Other specified disorders of bone density and structure, other site: Secondary | ICD-10-CM | POA: Diagnosis not present

## 2020-11-08 DIAGNOSIS — Z7984 Long term (current) use of oral hypoglycemic drugs: Secondary | ICD-10-CM | POA: Insufficient documentation

## 2020-11-08 DIAGNOSIS — I10 Essential (primary) hypertension: Secondary | ICD-10-CM | POA: Diagnosis not present

## 2020-11-08 DIAGNOSIS — M47816 Spondylosis without myelopathy or radiculopathy, lumbar region: Secondary | ICD-10-CM | POA: Diagnosis not present

## 2020-11-08 DIAGNOSIS — Z743 Need for continuous supervision: Secondary | ICD-10-CM | POA: Diagnosis not present

## 2020-11-08 MED ORDER — LIDOCAINE 4 % EX PTCH: 1.0000 | MEDICATED_PATCH | Freq: Two times a day (BID) | CUTANEOUS | 1 refills | Status: DC

## 2020-11-08 MED ORDER — KETOROLAC TROMETHAMINE 30 MG/ML IJ SOLN
15.0000 mg | Freq: Once | INTRAMUSCULAR | Status: AC
  Administered 2020-11-08: 15 mg via INTRAMUSCULAR
  Filled 2020-11-08: qty 1

## 2020-11-08 MED ORDER — GABAPENTIN 300 MG PO CAPS
300.0000 mg | ORAL_CAPSULE | Freq: Once | ORAL | Status: AC
  Administered 2020-11-08: 300 mg via ORAL
  Filled 2020-11-08: qty 1

## 2020-11-08 MED ORDER — CYCLOBENZAPRINE HCL 10 MG PO TABS: 10.0000 mg | ORAL_TABLET | Freq: Two times a day (BID) | ORAL | 0 refills | Status: DC | PRN

## 2020-11-08 NOTE — ED Provider Notes (Signed)
[facility] EMERGENCY DEPARTMENT Provider Note   CSN: [id number] Arrival date & time: 11/08/20  1820     History Chief Complaint  Patient presents with   Hip Pain    [name] is a 70 y.o. female with PMH bipolar 1, T2DM, previous CVA who presents emergency department for evaluation of right hip pain.  Dates that she twisted awkwardly on her hip 48 hours ago and has had persistent pain that starts in the right hip and shoots down to the knee.  Her pain does not extend down into the feet.  She has a history of neuropathy and states that her numbness and tingling is no different than normal.  She rates her pain as a 9 out of 10, it is worsened with external rotation and lifting the leg.  Denies weakness of the lower extremities, urinary retention or incontinence, fecal incontinence, saddle anesthesia or any other red flag signs of back pain.  Denies trauma to the hip.   Hip Pain Pertinent negatives include no chest pain, no abdominal pain and no shortness of breath.      Past Medical History:  Diagnosis Date   Bipolar 1 disorder ([place])    Cancer ([place])    Diabetes mellitus without complication ([place])    Hypertension    PONV (postoperative nausea and vomiting)    Stroke [facility])     Patient Active Problem List   Diagnosis Date Noted   GERD (gastroesophageal reflux disease) 09/04/2020   Colon cancer screening 09/04/2020   Chest pain 04/05/2018   Hypertension 11/30/2016   Bipolar 1 disorder ([place]) 11/30/2016   Diabetes mellitus without complication ([place]) [id number]   Closed fracture of left distal femur ([place]) 11/30/2016   Other fracture of left femur, initial encounter for closed fracture ([place]) 11/29/2016   Morbid obesity ([place]) 11/29/2016    Past Surgical History:  Procedure Laterality Date   ABDOMINAL HYSTERECTOMY     CHOLECYSTECTOMY     COLONOSCOPY WITH PROPOFOL N/A 10/06/2020   Procedure: COLONOSCOPY WITH PROPOFOL;  Surgeon: Eloise Harman, DO;  Location[facility] END[facility];  Service: Endoscopy;  Laterality: N/A;  8:15am   ORIF FEMUR FRACTURE Left 11/30/2016   Procedure: OPEN REDUCTION INTERNAL FIXATION (ORIF) DISTAL FEMUR FRACTURE;  Surgeon: Rod Can, MD;  Location: [facility];  Service: Orthopedics;  Laterality: Left;   POLYPECTOMY  10/06/2020   Procedure: POLYPECTOMY INTESTINAL;  Surgeon: Eloise Harman, DO;  Location: [facility] END[facility];  Service: Endoscopy;;   stroke     TONSILLECTOMY       OB History   No obstetric history on file.     Family History  Problem Relation Age of Onset   CAD Father    CAD Brother     Social History   Tobacco Use   Smoking status: Former    Packs/day: 3.00    Years: 20.00    Pack years: 60.00    Types: Cigarettes    Quit date: 09/26/2000    Years since quitting: 20.1   Smokeless tobacco: Never  Vaping Use   Vaping Use: Never used  Substance Use Topics   Alcohol use: No   Drug use: No    Home Medications Prior to Admission medications   Medication Sig Start Date End Date Taking? Authorizing Provider  cyclobenzaprine (FLEXERIL) 10 MG tablet Take 1 tablet (10 mg total) by mouth 2 (two) times daily as needed for muscle spasms. 11/08/20  Yes Yoseline Andersson, MD  Lidocaine (SALONPAS PAIN RELIEVING)  4 % PTCH Apply 1 each topically every 12 (twelve) hours. 11/08/20  Yes Jericca Russett, MD  albuterol (PROVENTIL HFA;VENTOLIN HFA) 108 (90 Base) MCG/ACT inhaler Inhale 2 puffs into the lungs every 8 (eight) hours as needed for shortness of breath or wheezing. 12/13/17   [provider]  aspirin EC 81 MG tablet Take 81 mg by mouth daily.    [provider]  budesonide-formoterol (SYMBICORT) 160-4.5 MCG/ACT inhaler Inhale 2 puffs into the lungs 2 (two) times daily.    [provider]  BYDUREON 2 MG PEN Inject 2 mg as directed every Tuesday. 10/18/17   [provider]  dicyclomine (BENTYL) 10 MG capsule Take 1 capsule (10 mg total) by mouth 4 (four) times daily -  before meals and at  bedtime. 10/29/20 04/27/21  Eloise Harman, DO  gabapentin (NEURONTIN) 100 MG capsule Take 100 mg by mouth 2 (two) times daily. 06/12/20   [provider]  hydrochlorothiazide (HYDRODIURIL) 25 MG tablet Take 25 mg by mouth daily. 11/06/17   [provider]  Ibuprofen-Acetaminophen (ADVIL DUAL ACTION) 125-250 MG TABS Take 2 tablets by mouth every 8 (eight) hours as needed (pain).    [provider]  insulin detemir (LEVEMIR) 100 UNIT/ML injection Inject 0.18 mLs (18 Units total) into the skin at bedtime. 04/27/18   Strader, Fransisco Hertz, PA-C  lisinopril (ZESTRIL) 40 MG tablet Take 40 mg by mouth daily.    [provider]  meclizine (ANTIVERT) 25 MG tablet Take 25 mg by mouth 2 (two) times daily as needed for dizziness. 03/16/18   [provider]  metFORMIN (GLUCOPHAGE) 500 MG tablet Take 500 mg by mouth 2 (two) times daily with a meal.    [provider]  metoprolol tartrate (LOPRESSOR) 50 MG tablet Take 50 mg by mouth 2 (two) times daily. 08/30/17   [provider]  montelukast (SINGULAIR) 10 MG tablet Take 10 mg by mouth at bedtime.    [provider]  nitroGLYCERIN (NITROSTAT) 0.4 MG SL tablet Place 1 tablet (0.4 mg total) under the tongue every 5 (five) minutes x 3 doses as needed for chest pain. 04/27/18   Strader, Fransisco Hertz, PA-C  pantoprazole (PROTONIX) 40 MG tablet Take 40 mg by mouth daily. 11/28/17   [provider]  polyethylene glycol-electrolytes (NULYTELY) 420 g solution As directed 09/04/20   Eloise Harman, DO  promethazine (PHENERGAN) 25 MG tablet Take 25 mg by mouth 3 (three) times daily as needed for nausea or vomiting. 12/02/17   [provider]  sodium chloride (OCEAN) 0.65 % SOLN nasal spray Place 1 spray into both nostrils as needed for congestion.    [provider]  VASCEPA 1 g capsule Take 2 g by mouth 2 (two) times daily. 06/17/20   [provider]    Allergies     Ciprofloxacin, Latex, and Sulfa antibiotics  Review of Systems   Review of Systems  Constitutional:  Negative for chills and fever.  HENT:  Negative for ear pain and sore throat.   Eyes:  Negative for pain and visual disturbance.  Respiratory:  Negative for cough and shortness of breath.   Cardiovascular:  Negative for chest pain and palpitations.  Gastrointestinal:  Negative for abdominal pain and vomiting.  Genitourinary:  Negative for dysuria and hematuria.  Musculoskeletal:  Positive for arthralgias. Negative for back pain.  Skin:  Negative for color change and rash.  Neurological:  Negative for seizures and syncope.  All other systems reviewed  and are negative.  Physical Exam Updated Vital Signs BP (!) 155/74 (BP Location: Left Arm)   Pulse 80   Temp 98 F (36.7 C) (Oral)   Resp 18   Ht '5\' 3"'$  (1.6 m)   Wt 121.1 kg   SpO2 95%   BMI 47.30 kg/m   Physical Exam Vitals and nursing note reviewed.  Constitutional:      General: She is not in acute distress.    Appearance: She is well-developed.  HENT:     Head: Normocephalic and atraumatic.  Eyes:     Conjunctiva/sclera: Conjunctivae normal.  Cardiovascular:     Rate and Rhythm: Normal rate and regular rhythm.     Heart sounds: No murmur heard. Pulmonary:     Effort: Pulmonary effort is normal. No respiratory distress.     Breath sounds: Normal breath sounds.  Abdominal:     Palpations: Abdomen is soft.     Tenderness: There is no abdominal tenderness.  Musculoskeletal:        General: Tenderness (Right hip, worse with external rotation of lower extremity) present.     Cervical back: Neck supple.  Skin:    General: Skin is warm and dry.  Neurological:     Mental Status: She is alert.    ED Results / Procedures / Treatments   Labs (all labs ordered are listed, but only abnormal results are displayed) Labs Reviewed - No data to display  EKG None  Radiology DG Hip Unilat W or Wo Pelvis 2-3 Views  Right  Result Date: 11/08/2020 CLINICAL DATA:  Evaluate for fracture. EXAM: DG HIP (WITH OR WITHOUT PELVIS) 2-3V RIGHT COMPARISON:  Right hip x-ray 10/03/2017. FINDINGS: The bones are diffusely osteopenic. There is no acute fracture or dislocation identified. Joint spaces are maintained. Degenerative changes affect the lower lumbar spine, unchanged. Soft tissues are unremarkable. IMPRESSION: 1. No acute fracture or dislocation of the right hip. Electronically Signed   By: Ronney Asters M.D.   On: 11/08/2020 21:53    Procedures Procedures   Medications Ordered in ED Medications  gabapentin (NEURONTIN) capsule 300 mg (300 mg Oral Given 11/08/20 2055)  ketorolac (TORADOL) 30 MG/ML injection 15 mg (15 mg Intramuscular Given 11/08/20 2055)    ED Course  I have reviewed the triage vital signs and the nursing notes.  Pertinent labs & imaging results that were available during my care of the patient were reviewed by me and considered in my medical decision making (see chart for details).    MDM Rules/Calculators/A&P                           Patient seen in the emergency department for evaluation of hip pain.  Physical exam reveals tenderness to the right hip worse with external rotation of the right lower extremity.  X-ray of the hip and pelvis unremarkable.  Patient given Toradol and gabapentin which led to improvement of her pain.  Patient able to ambulate in the emergency department out difficulty.  Suspect muscular strain versus sciatica.  Low suspicion for epidural abscess or cauda equina as patient has no red flag signs of back pain.  Patient will follow up with her primary care physician and she was discharged with a muscle relaxer and Lidoderm patches. Final Clinical Impression(s) / ED Diagnoses Final diagnoses:  Sciatica of right side    Rx / DC Orders ED Discharge Orders          Ordered  cyclobenzaprine (FLEXERIL) 10 MG tablet  2 times daily PRN        11/08/20 2258     Lidocaine (SALONPAS PAIN RELIEVING) 4 % PTCH  Every 12 hours        11/08/20 2258             Teressa Lower, MD 11/08/20 2356

## 2020-11-08 NOTE — ED Triage Notes (Signed)
Pt c/o right hip and upper leg pain since Thursday night.

## 2020-11-13 DIAGNOSIS — M810 Age-related osteoporosis without current pathological fracture: Secondary | ICD-10-CM | POA: Insufficient documentation

## 2020-11-18 DIAGNOSIS — M5431 Sciatica, right side: Secondary | ICD-10-CM | POA: Diagnosis not present

## 2020-11-18 DIAGNOSIS — M25551 Pain in right hip: Secondary | ICD-10-CM | POA: Diagnosis not present

## 2020-12-02 DIAGNOSIS — M25561 Pain in right knee: Secondary | ICD-10-CM | POA: Diagnosis not present

## 2020-12-02 DIAGNOSIS — M25551 Pain in right hip: Secondary | ICD-10-CM | POA: Diagnosis not present

## 2020-12-02 DIAGNOSIS — M5431 Sciatica, right side: Secondary | ICD-10-CM | POA: Diagnosis not present

## 2020-12-02 DIAGNOSIS — R600 Localized edema: Secondary | ICD-10-CM | POA: Diagnosis not present

## 2020-12-03 ENCOUNTER — Encounter: Payer: Self-pay | Admitting: Orthopaedic Surgery

## 2020-12-04 ENCOUNTER — Ambulatory Visit: Payer: Medicare HMO | Admitting: Cardiology

## 2020-12-09 ENCOUNTER — Ambulatory Visit: Payer: Medicare HMO | Admitting: Orthopaedic Surgery

## 2020-12-15 ENCOUNTER — Ambulatory Visit: Payer: Medicare HMO | Admitting: Cardiology

## 2020-12-23 ENCOUNTER — Ambulatory Visit: Payer: Medicare HMO | Admitting: Orthopaedic Surgery

## 2020-12-23 DIAGNOSIS — J019 Acute sinusitis, unspecified: Secondary | ICD-10-CM | POA: Diagnosis not present

## 2020-12-23 DIAGNOSIS — J309 Allergic rhinitis, unspecified: Secondary | ICD-10-CM | POA: Diagnosis not present

## 2020-12-23 DIAGNOSIS — Z1159 Encounter for screening for other viral diseases: Secondary | ICD-10-CM | POA: Diagnosis not present

## 2020-12-23 DIAGNOSIS — Z1152 Encounter for screening for COVID-19: Secondary | ICD-10-CM | POA: Diagnosis not present

## 2021-01-06 ENCOUNTER — Ambulatory Visit: Payer: Medicare HMO | Admitting: Cardiology

## 2021-01-08 DIAGNOSIS — E119 Type 2 diabetes mellitus without complications: Secondary | ICD-10-CM | POA: Diagnosis not present

## 2021-01-08 DIAGNOSIS — I10 Essential (primary) hypertension: Secondary | ICD-10-CM | POA: Diagnosis not present

## 2021-01-08 DIAGNOSIS — E785 Hyperlipidemia, unspecified: Secondary | ICD-10-CM | POA: Diagnosis not present

## 2021-01-13 DIAGNOSIS — I129 Hypertensive chronic kidney disease with stage 1 through stage 4 chronic kidney disease, or unspecified chronic kidney disease: Secondary | ICD-10-CM | POA: Diagnosis not present

## 2021-01-13 DIAGNOSIS — Z79899 Other long term (current) drug therapy: Secondary | ICD-10-CM | POA: Diagnosis not present

## 2021-01-13 DIAGNOSIS — I10 Essential (primary) hypertension: Secondary | ICD-10-CM | POA: Diagnosis not present

## 2021-01-13 DIAGNOSIS — J449 Chronic obstructive pulmonary disease, unspecified: Secondary | ICD-10-CM | POA: Diagnosis not present

## 2021-02-10 DIAGNOSIS — I10 Essential (primary) hypertension: Secondary | ICD-10-CM | POA: Diagnosis not present

## 2021-02-10 DIAGNOSIS — J449 Chronic obstructive pulmonary disease, unspecified: Secondary | ICD-10-CM | POA: Diagnosis not present

## 2021-02-10 DIAGNOSIS — E785 Hyperlipidemia, unspecified: Secondary | ICD-10-CM | POA: Diagnosis not present

## 2021-02-10 DIAGNOSIS — R609 Edema, unspecified: Secondary | ICD-10-CM | POA: Diagnosis not present

## 2021-03-05 ENCOUNTER — Other Ambulatory Visit (HOSPITAL_COMMUNITY)
Admission: RE | Admit: 2021-03-05 | Discharge: 2021-03-05 | Disposition: A | Discharge status: Home / Self Care | Payer: Medicare HMO | Source: Ambulatory Visit | Attending: Medical | Admitting: Medical

## 2021-03-05 ENCOUNTER — Encounter: Payer: Self-pay | Admitting: Medical

## 2021-03-05 ENCOUNTER — Ambulatory Visit: Payer: Medicare HMO | Admitting: Medical

## 2021-03-05 ENCOUNTER — Other Ambulatory Visit: Payer: Self-pay

## 2021-03-05 VITALS — BP 120/84 | HR 84 | Ht 63.0 in | Wt 265.0 lb

## 2021-03-05 DIAGNOSIS — I5032 Chronic diastolic (congestive) heart failure: Secondary | ICD-10-CM | POA: Diagnosis not present

## 2021-03-05 DIAGNOSIS — R0602 Shortness of breath: Secondary | ICD-10-CM | POA: Diagnosis not present

## 2021-03-05 DIAGNOSIS — I10 Essential (primary) hypertension: Secondary | ICD-10-CM | POA: Diagnosis not present

## 2021-03-05 DIAGNOSIS — J449 Chronic obstructive pulmonary disease, unspecified: Secondary | ICD-10-CM

## 2021-03-05 DIAGNOSIS — E782 Mixed hyperlipidemia: Secondary | ICD-10-CM

## 2021-03-05 LAB — CBC
HCT: 40.4 % (ref 36.0–46.0)
Hemoglobin: 13.4 g/dL (ref 12.0–15.0)
MCH: 30.1 pg (ref 26.0–34.0)
MCHC: 33.2 g/dL (ref 30.0–36.0)
MCV: 90.8 fL (ref 80.0–100.0)
Platelets: 317 10*3/uL (ref 150–400)
RBC: 4.45 MIL/uL (ref 3.87–5.11)
RDW: 13.2 % (ref 11.5–15.5)
WBC: 8.6 10*3/uL (ref 4.0–10.5)
nRBC: 0 % (ref 0.0–0.2)

## 2021-03-05 LAB — BASIC METABOLIC PANEL
Anion gap: 8 (ref 5–15)
BUN: 18 mg/dL (ref 8–23)
CO2: 29 mmol/L (ref 22–32)
Calcium: 9.1 mg/dL (ref 8.9–10.3)
Chloride: 101 mmol/L (ref 98–111)
Creatinine, Ser: 1.28 mg/dL — ABNORMAL HIGH (ref 0.44–1.00)
GFR, Estimated: 46 mL/min — ABNORMAL LOW (ref >60)
Glucose, Bld: 124 mg/dL — ABNORMAL HIGH (ref 70–99)
Potassium: 3.7 mmol/L (ref 3.5–5.1)
Sodium: 138 mmol/L (ref 135–145)

## 2021-03-05 LAB — BRAIN NATRIURETIC PEPTIDE: B Natriuretic Peptide: 14 pg/mL (ref 0.0–100.0)

## 2021-03-05 NOTE — Progress Notes (Signed)
Cardiology Office Note:    Date:  03/05/2021   ID:  [name], DOB 1951/02/01, MRN [id number]  PCP:  Lori Gravel, MD  [facility] Cardiologist:  Dr. [facility] Electrophysiologist:  None   Referring MD: Lori Gravel, MD   Chief Complaint: 6 mth follow-up/overdue f/u  History of Present Illness:    [name] is a 71 y.o. female with a hx of female with past medical history of prior stroke, bipolar 1,  HTN, HLD, Type 2 DM, and Bipolar disorder  who presents to the office today for hospital follow-up.   She was admitted  [facility] ED on 04/05/2018 after developing the chest pain while undergoing pulmonary function testing. She reported having episodes of discomfort for the past 6+ months along her entire precordium which could last for hours at a time and was not associated with exertion or positional changes. EKG showed no acute ischemic changes and cyclic troponin values were negative.  Echocardiogram was performed which showed a preserved EF of 60 to 65% with no regional wall motion abnormalities. No significant valve abnormalities identified either. An outpatient stress test was recommended for further evaluation. This was performed on 04/19/2018 and showed a small apical anterior septal defect which was thought to be most consistent with soft tissue attenuation and no definitive ischemia was identified. The study was overall low risk.  She was seen in follow-up 04/27/2018 and denied recurrent chest pain. No further ischemic work-up was pursued.  Today,  the patient reports she has been SOB. Feels she has been short of breath for the last year and it has been getting worse. No chest pain. Smoked until 71yo. Repots intermittent lower extremity edema. Has chronic orthopnea. No fever or chills, nausea, vomiting. She has been taking Torsemide 5mg  daily, given by PCP. She started it last month, but only took it for a month. Also was on HCTZ. She follows with ?[facility].  Also thinks she had a stroke December due to funny feeling in the left side of the face. She didn't go see PCP or go to the ER.   Past Medical History:  Diagnosis Date   Bipolar 1 disorder ([place])    Cancer ([place])    Diabetes mellitus without complication ([place])    Hypertension    PONV (postoperative nausea and vomiting)    Stroke [facility])     Past Surgical History:  Procedure Laterality Date   ABDOMINAL HYSTERECTOMY     CHOLECYSTECTOMY     COLONOSCOPY WITH PROPOFOL N/A 10/06/2020   Procedure: COLONOSCOPY WITH PROPOFOL;  Surgeon: Lori Harman, DO;  [facility];  Service: Endoscopy;  Laterality: N/A;  8:15am   ORIF FEMUR FRACTURE Left 11/30/2016   Procedure: OPEN REDUCTION INTERNAL FIXATION (ORIF) DISTAL FEMUR FRACTURE;  Surgeon: Rod Can, MD;  Location: [facility];  Service: Orthopedics;  Laterality: Left;   POLYPECTOMY  10/06/2020   Procedure: POLYPECTOMY INTESTINAL;  Surgeon: Lori Harman, DO;  [facility];  Service: Endoscopy;;   stroke     TONSILLECTOMY      Current Medications: Current Meds  Medication Sig   albuterol (PROVENTIL HFA;VENTOLIN HFA) 108 (90 Base) MCG/ACT inhaler Inhale 2 puffs into the lungs every 8 (eight) hours as needed for shortness of breath or wheezing.   aspirin EC 81 MG tablet Take 81 mg by mouth daily.   budesonide-formoterol (SYMBICORT) 160-4.5 MCG/ACT inhaler Inhale 2 puffs into the lungs 2 (two) times daily.  BYDUREON 2 MG PEN Inject 2 mg as directed every Tuesday.   cyclobenzaprine (FLEXERIL) 10 MG tablet Take 1 tablet (10 mg total) by mouth 2 (two) times daily as needed for muscle spasms.   dicyclomine (BENTYL) 10 MG capsule Take 1 capsule (10 mg total) by mouth 4 (four) times daily -  before meals and at bedtime.   gabapentin (NEURONTIN) 100 MG capsule Take 100 mg by mouth 2 (two) times daily.   hydrochlorothiazide (HYDRODIURIL) 25 MG tablet Take 25 mg by mouth daily.   Ibuprofen-Acetaminophen (ADVIL DUAL  ACTION) 125-250 MG TABS Take 2 tablets by mouth every 8 (eight) hours as needed (pain).   insulin detemir (LEVEMIR) 100 UNIT/ML injection Inject 0.18 mLs (18 Units total) into the skin at bedtime.   Lidocaine (SALONPAS PAIN RELIEVING) 4 % PTCH Apply 1 each topically every 12 (twelve) hours.   lisinopril (ZESTRIL) 40 MG tablet Take 40 mg by mouth daily.   meclizine (ANTIVERT) 25 MG tablet Take 25 mg by mouth 2 (two) times daily as needed for dizziness.   metFORMIN (GLUCOPHAGE) 500 MG tablet Take 500 mg by mouth 2 (two) times daily with a meal.   metoprolol tartrate (LOPRESSOR) 50 MG tablet Take 50 mg by mouth 2 (two) times daily.   montelukast (SINGULAIR) 10 MG tablet Take 10 mg by mouth at bedtime.   nitroGLYCERIN (NITROSTAT) 0.4 MG SL tablet Place 1 tablet (0.4 mg total) under the tongue every 5 (five) minutes x 3 doses as needed for chest pain.   pantoprazole (PROTONIX) 40 MG tablet Take 40 mg by mouth daily.   polyethylene glycol-electrolytes (NULYTELY) 420 g solution As directed   promethazine (PHENERGAN) 25 MG tablet Take 25 mg by mouth 3 (three) times daily as needed for nausea or vomiting.   simvastatin (ZOCOR) 20 MG tablet Take 20 mg by mouth daily.   sodium chloride (OCEAN) 0.65 % SOLN nasal spray Place 1 spray into both nostrils as needed for congestion.   torsemide (DEMADEX) 5 MG tablet Take 5 mg by mouth daily.   VASCEPA 1 g capsule Take 2 g by mouth 2 (two) times daily.     Allergies:   Ciprofloxacin, Latex, and Sulfa antibiotics   Social History   Socioeconomic History   Marital status: Married    Spouse name: Not on file   Number of children: Not on file   Years of education: Not on file   Highest education level: Not on file  Occupational History   Not on file  Tobacco Use   Smoking status: Former    Packs/day: 3.00    Years: 20.00    Pack years: 60.00    Types: Cigarettes    Quit date: 09/26/2000    Years since quitting: 20.4   Smokeless tobacco: Never  Vaping  Use   Vaping Use: Never used  Substance and Sexual Activity   Alcohol use: No   Drug use: No   Sexual activity: Not Currently  Other Topics Concern   Not on file  Social History Narrative   Not on file   Social Determinants of Health   Financial Resource Strain: Not on file  Food Insecurity: Not on file  Transportation Needs: Not on file  Physical Activity: Not on file  Stress: Not on file  Social Connections: Not on file     Family History: The patient's family history includes CAD in her brother and father.  ROS:   Please see the history of present illness.  All other systems reviewed and are negative.  EKGs/Labs/Other Studies Reviewed:    The following studies were reviewed today:  Echocardiogram: 04/06/2018 IMPRESSIONS  1. The left ventricle has normal systolic function with an ejection fraction of 60-65%. The cavity size was normal. There is moderately increased left ventricular wall thickness. Left ventricular diastolic Doppler parameters are consistent with impaired  relaxation.  2. The right ventricle has normal systolic function. The cavity was normal. There is no increase in right ventricular wall thickness.  3. The mitral valve is normal in structure. No evidence of mitral valve stenosis.  4. The tricuspid valve is normal in structure.  5. The aortic valve is normal in structure. There is mild thickening of the aortic valve.  6. The aortic root is normal in size and structure.  7. Right atrial pressure is estimated at 3 mmHg.  8. The interatrial septum was not well visualized.   NST: 04/19/2018 No diagnostic ST segment changes to indicate ischemia. Small, very mild intensity, apical anteroseptal defect that is most consistent with soft tissue attenuation. No definite ischemia. This is a low risk study. Nuclear stress EF: 63%.  EKG:  EKG is  ordered today.  The ekg ordered today demonstrates NSR, 88bpm, nonspecific T wave changes, no significant  changes  Recent Labs: 10/02/2020: BUN 22; Creatinine, Ser 1.08; Potassium 3.6; Sodium 136  Recent Lipid Panel    Component Value Date/Time   CHOL 165 04/06/2018 0239   TRIG 333 (H) 04/06/2018 0239   HDL 44 04/06/2018 0239   CHOLHDL 3.8 04/06/2018 0239   VLDL 67 (H) 04/06/2018 0239   LDLCALC 54 04/06/2018 0239    Physical Exam:    VS:  BP 120/84    Pulse 84    Ht 5\' 3"  (1.6 m)    Wt 265 lb (120.2 kg)    SpO2 98%    BMI 46.94 kg/m     Wt Readings from Last 3 Encounters:  03/05/21 265 lb (120.2 kg)  11/08/20 267 lb (121.1 kg)  10/02/20 273 lb (123.8 kg)     GEN:  Well nourished, well developed in no acute distress HEENT: Normal NECK: No JVD; No carotid bruits LYMPHATICS: No lymphadenopathy CARDIAC: RRR, no murmurs, rubs, gallops RESPIRATORY:  Clear to auscultation without rales, wheezing or rhonchi  ABDOMEN: Soft, non-tender, non-distended MUSCULOSKELETAL:  No edema; No deformity  SKIN: Warm and dry NEUROLOGIC:  Alert and oriented x 3 PSYCHIATRIC:  Normal affect   ASSESSMENT:    1. SOB (shortness of breath)   2. Chronic diastolic heart failure ([place])   3. Hyperlipidemia, mixed   4. Essential hypertension   5. Chronic obstructive pulmonary disease, unspecified COPD type ([place])    PLAN:    In order of problems listed above:  DOE COPD not on O2 HFpEF She reports progressive DOE over the last year. H/o COPD not on O2 at baseline. As reported by the patient, she was placed on Torsemide by PCP last month for LLE/SOB, she reportedly took it for a month and is now off of it. On exam she appears euvolemic, she is clearly short of breath on exertion. Lungs clear. O2 98%, no LLE. She denies chest pain. BP and HR are normal. EKG today shows NSR. Echo 03/2018 showed LVEF 60-65%, moderate LV thickness wall increase, impaired relaxation, RAP 67mmHg. Prior MPI showed low risk study with no definite ischemia. I will check an echo, BMET, BNP, and CBC. I instructed her to not hesitate to go  to  the ER/call 911 if breathing becomes severe. She is on HCTZ 25mg  daily, based off labs may or may not need a stronger diuretic.   HTN BP good today, continue current medications.   HLD LDL 54 in 2020. Needs repeat eventually. Continue Vascepa, simvastatin.  GERD Continue PPI  Disposition: Follow up in 6 week(s) with MD/APP   Signed, Sinclair Arrazola Ninfa Meeker, PA-C  03/05/2021 2:05 PM    [facility]

## 2021-03-05 NOTE — Patient Instructions (Signed)
Medication Instructions:  Your physician recommends that you continue on your current medications as directed. Please refer to the Current Medication list given to you today.   Labwork: Cbc,bmet,bnp  Testing/Procedures: Your physician has requested that you have an echocardiogram. Echocardiography is a painless test that uses sound waves to create images of your heart. It provides your doctor with information about the size and shape of your heart and how well your hearts chambers and valves are working. This procedure takes approximately one hour. There are no restrictions for this procedure.   Follow-Up: 6 weeks  Any Other Special Instructions Will Be Listed Below (If Applicable).  If you need a refill on your cardiac medications before your next appointment, please call your pharmacy.

## 2021-03-05 NOTE — Addendum Note (Signed)
Addended by: Barbarann Ehlers A on: 03/05/2021 04:41 PM   Modules accepted: Orders

## 2021-03-06 ENCOUNTER — Telehealth: Payer: Self-pay | Admitting: *Deleted

## 2021-03-06 NOTE — Telephone Encounter (Signed)
-----   Message from  Mills, PA-C sent at 03/06/2021 10:29 AM EST ----- Labs showed relatively stable kidney function on HCTZ. Heart failure lab value normal. CBC normal

## 2021-03-06 NOTE — Telephone Encounter (Signed)
Attempted to call pt to review lab results.  No answer. Lmtcb.

## 2021-03-06 NOTE — Telephone Encounter (Signed)
The patient has been notified of the result and verbalized understanding.  All questions (if any) were answered.     

## 2021-03-11 DIAGNOSIS — Z79899 Other long term (current) drug therapy: Secondary | ICD-10-CM | POA: Diagnosis not present

## 2021-03-11 DIAGNOSIS — E785 Hyperlipidemia, unspecified: Secondary | ICD-10-CM | POA: Diagnosis not present

## 2021-03-11 DIAGNOSIS — Z130 Encounter for screening for diseases of the blood and blood-forming organs and certain disorders involving the immune mechanism: Secondary | ICD-10-CM | POA: Diagnosis not present

## 2021-03-11 DIAGNOSIS — I10 Essential (primary) hypertension: Secondary | ICD-10-CM | POA: Diagnosis not present

## 2021-03-11 DIAGNOSIS — E119 Type 2 diabetes mellitus without complications: Secondary | ICD-10-CM | POA: Diagnosis not present

## 2021-03-16 DIAGNOSIS — N390 Urinary tract infection, site not specified: Secondary | ICD-10-CM | POA: Diagnosis not present

## 2021-03-16 DIAGNOSIS — E785 Hyperlipidemia, unspecified: Secondary | ICD-10-CM | POA: Diagnosis not present

## 2021-03-16 DIAGNOSIS — J449 Chronic obstructive pulmonary disease, unspecified: Secondary | ICD-10-CM | POA: Diagnosis not present

## 2021-03-16 DIAGNOSIS — E559 Vitamin D deficiency, unspecified: Secondary | ICD-10-CM | POA: Diagnosis not present

## 2021-03-16 DIAGNOSIS — I10 Essential (primary) hypertension: Secondary | ICD-10-CM | POA: Diagnosis not present

## 2021-03-16 DIAGNOSIS — R42 Dizziness and giddiness: Secondary | ICD-10-CM | POA: Diagnosis not present

## 2021-03-16 DIAGNOSIS — E119 Type 2 diabetes mellitus without complications: Secondary | ICD-10-CM | POA: Diagnosis not present

## 2021-03-16 DIAGNOSIS — N1831 Chronic kidney disease, stage 3a: Secondary | ICD-10-CM | POA: Diagnosis not present

## 2021-03-16 DIAGNOSIS — I129 Hypertensive chronic kidney disease with stage 1 through stage 4 chronic kidney disease, or unspecified chronic kidney disease: Secondary | ICD-10-CM | POA: Diagnosis not present

## 2021-03-18 ENCOUNTER — Ambulatory Visit: Payer: Medicare HMO | Admitting: Gastroenterology

## 2021-04-06 ENCOUNTER — Ambulatory Visit (HOSPITAL_COMMUNITY): Admission: RE | Admit: 2021-04-06 | Payer: Medicare HMO | Source: Ambulatory Visit

## 2021-04-07 ENCOUNTER — Ambulatory Visit: Payer: Medicare HMO | Admitting: Physician Assistant

## 2021-04-29 ENCOUNTER — Ambulatory Visit (HOSPITAL_COMMUNITY): Payer: Medicare HMO

## 2021-05-04 ENCOUNTER — Ambulatory Visit: Payer: Medicare HMO | Admitting: Cardiology

## 2021-05-07 ENCOUNTER — Ambulatory Visit: Payer: Medicare HMO | Admitting: Internal Medicine

## 2021-05-07 ENCOUNTER — Encounter: Payer: Self-pay | Admitting: Internal Medicine

## 2021-05-07 ENCOUNTER — Ambulatory Visit (HOSPITAL_COMMUNITY)
Admission: RE | Admit: 2021-05-07 | Discharge: 2021-05-07 | Disposition: A | Discharge status: Home / Self Care | Payer: Medicare HMO | Source: Ambulatory Visit | Attending: Internal Medicine | Admitting: Internal Medicine

## 2021-05-07 ENCOUNTER — Other Ambulatory Visit: Payer: Self-pay

## 2021-05-07 DIAGNOSIS — R0600 Dyspnea, unspecified: Secondary | ICD-10-CM | POA: Diagnosis not present

## 2021-05-07 DIAGNOSIS — R059 Cough, unspecified: Secondary | ICD-10-CM | POA: Diagnosis not present

## 2021-05-07 DIAGNOSIS — R058 Other specified cough: Secondary | ICD-10-CM | POA: Diagnosis not present

## 2021-05-07 DIAGNOSIS — I10 Essential (primary) hypertension: Secondary | ICD-10-CM

## 2021-05-07 MED ORDER — FAMOTIDINE 20 MG PO TABS: ORAL_TABLET | ORAL | 11 refills | Status: DC

## 2021-05-07 MED ORDER — IRBESARTAN 300 MG PO TABS: 300.0000 mg | ORAL_TABLET | Freq: Every day | ORAL | 11 refills | Status: DC

## 2021-05-07 MED ORDER — METHYLPREDNISOLONE ACETATE 80 MG/ML IJ SUSP
80.0000 mg | Freq: Once | INTRAMUSCULAR | Status: AC
  Administered 2021-05-07: 80 mg via INTRAMUSCULAR

## 2021-05-07 NOTE — Patient Instructions (Signed)
Stop lisinopril and your breathing should gradually improve over the next few weeks ? ?Ibesartan '300mg'$  one half twice daily - call me if any problems ? ?Stop trelegy and just albuterol as needed  ? ?Depomedrol 80 mg IM today  ? ?Pantoprazole (protonix) 40 mg   Take  30-60 min before first meal of the day and Pepcid (famotidine)  20 mg after supper until return to office - this is the best way to tell whether stomach acid is contributing to your problem.   ? ? ?Please remember to go to the lab department   for your tests - we will call you with the results when they are available. ?    ? ?Please remember to go to the  x-ray department  @  [facility] for your tests - we will call you with the results when they are available    ? ? ?Please schedule a follow up office visit in 6 weeks, call sooner if needed with all medications /inhalers/ solutions in hand so we can verify exactly what you are taking. This includes all medications from all doctors and over the counters  ? ? ?

## 2021-05-07 NOTE — Assessment & Plan Note (Addendum)
Spirometry  04/05/18 FEV1 2.10 (90%)  Ratio 0.77 with ERV 28% at wt 259   ? ?Body mass index is 47.62 kg/m?.    ?Lab Results  ?Component Value Date  ? TSH 3.560 03/27/2010  ?  ? ? ?Contributing to doe and GERD ?>>>   reviewed the need and the process to achieve and maintain neg calorie balance > defer f/u primary care including intermittently monitoring thyroid status   ? ?    ?  ? ?Each maintenance medication was reviewed in detail including emphasizing most importantly the difference between maintenance and prns and under what circumstances the prns are to be triggered using an action plan format where appropriate. ? ?Total time for H and P, chart review, counseling, reviewing hfa device(s) and generating customized AVS unique to this initial office visit / same day charting  > 45 min  ?     ?

## 2021-05-07 NOTE — Assessment & Plan Note (Signed)
Try off acei 05/07/2021  ? ?ACE inhibitors are problematic in  pts with airway complaints because  even experienced pulmonologists can't always distinguish ace effects from copd/asthma/pnds/ allergies etc.  By themselves they don't actually cause a problem, much like oxygen can't by itself start a fire, but they certainly serve as a powerful catalyst or enhancer for any "fire"  or inflammatory process in the upper airway, be it caused by an ET  tube or more commonly reflux (especially in the obese or pts with known GERD or who are on biphoshonates) or URI's, due to interference with bradykinin clearance. ? ?The effects of acei on bradykinin levels occurs in 100% of pt's on acei (unless they surreptitiously stop the med!) but the classic cough is only reported in 5%.  This leaves 95% of pts on acei's  with a variety of syndromes including no identifiable symptom in most  vs non-specific symptoms that wax and wane depending on what other insult is occuring at the level of the upper airway.- only way to sort it out = trial off x 6 weeks and then regroup ? ?rec ?avapro 300 mg one half twice daily so she can hold a dose if too strong or take both if needed to keep bp where it is  ?

## 2021-05-07 NOTE — Progress Notes (Signed)
? ?[name], female    DOB: [birth year]  MRN: [id number] ? ? ?Brief patient profile:  ?12   yowf  quit smoking 2000-05-14 having already developed rhintis but no lower resp problems s/p allergy eval in her 99s (does not recall who/what was found) but despite stopping smoking allergy symptoms and breathing problems worsened esp since her father died May 15, 2015  referred to pulmonary clinic in [place]  05/07/2021 by Blenda Nicely NP  for sob.   ? ? ? ? ?History of Present Illness  ?05/07/2021  Pulmonary/ 1st office eval/ Melvyn Novas / [facility]  ?Chief Complaint  ?Patient presents with  ? Consult  ?  SOB consult   ?Dyspnea:  rides scooter most of the time, stops on every aisle when not on scooter  ?Cough: occurs p supper / can be pretty bad with gagging and urinating on self  ?Sleep: flat bed with a bunch of pillows for breathing and overt GERD  ?SABA use: seems to help but watis until over does it  ?No better with symbicort  ? ?No obvious day to day or daytime variability or assoc excess/ purulent sputum or mucus plugs or hemoptysis or cp or chest tightness, subjective wheeze  ? ?Sleeping as above without nocturnal  or early am exacerbation  of respiratory  c/o's or need for noct saba. Also denies any obvious fluctuation of symptoms with weather or environmental changes or other aggravating or alleviating factors except as outlined above  ? ?No unusual exposure hx or h/o childhood pna/ asthma or knowledge of premature birth. ? ?Current Allergies, Complete Past Medical History, Past Surgical History, Family History, and Social History were reviewed in [employer] record. ? ?ROS  The following are not active complaints unless bolded ?Hoarseness, sore throat, dysphagia/globus, dental problems, itching, sneezing,  nasal congestion or discharge of excess mucus or purulent secretions, ear ache,   fever, chills, sweats, unintended wt loss or wt gain, classically pleuritic or exertional cp,  orthopnea pnd or  arm/hand swelling  or leg swelling, presyncope, palpitations, abdominal pain, anorexia, nausea, vomiting, diarrhea  or change in bowel habits or change in bladder habits, change in stools or change in urine, dysuria, hematuria,  rash, arthralgias, visual complaints, headache, numbness, weakness or ataxia or problems with walking/ uses cane or coordination,  change in mood or  memory. ?      ?   ? ?Past Medical History:  ?Diagnosis Date  ? Bipolar 1 disorder ([place])   ? Cancer [facility])   ? Diabetes mellitus without complication ([place])   ? Hypertension   ? PONV (postoperative nausea and vomiting)   ? Stroke [facility])   ? ? ?Outpatient Medications Prior to Visit  ?Medication Sig Dispense Refill  ? albuterol (PROVENTIL HFA;VENTOLIN HFA) 108 (90 Base) MCG/ACT inhaler Inhale 2 puffs into the lungs every 8 (eight) hours as needed for shortness of breath or wheezing.  3  ? aspirin EC 81 MG tablet Take 81 mg by mouth daily.    ? budesonide-formoterol (SYMBICORT) 160-4.5 MCG/ACT inhaler Inhale 2 puffs into the lungs 2 (two) times daily.    ? BYDUREON 2 MG PEN Inject 2 mg as directed every Tuesday.  0  ? cyclobenzaprine (FLEXERIL) 10 MG tablet Take 1 tablet (10 mg total) by mouth 2 (two) times daily as needed for muscle spasms. 20 tablet 0  ? gabapentin (NEURONTIN) 100 MG capsule Take 100 mg by mouth 2 (two) times daily.    ? hydrochlorothiazide (HYDRODIURIL) 25 MG  tablet Take 25 mg by mouth daily.  0  ? Ibuprofen-Acetaminophen (ADVIL DUAL ACTION) 125-250 MG TABS Take 2 tablets by mouth every 8 (eight) hours as needed (pain).    ? insulin detemir (LEVEMIR) 100 UNIT/ML injection Inject 0.18 mLs (18 Units total) into the skin at bedtime. 10 mL   ? Lidocaine (SALONPAS PAIN RELIEVING) 4 % PTCH Apply 1 each topically every 12 (twelve) hours. 15 patch 1  ? lisinopril (ZESTRIL) 40 MG tablet Take 40 mg by mouth daily.    ? meclizine (ANTIVERT) 25 MG tablet Take 25 mg by mouth 2 (two) times daily as needed for dizziness.    ? metFORMIN  (GLUCOPHAGE) 500 MG tablet Take 500 mg by mouth 2 (two) times daily with a meal.    ? metoprolol tartrate (LOPRESSOR) 50 MG tablet Take 50 mg by mouth 2 (two) times daily.  6  ? montelukast (SINGULAIR) 10 MG tablet Take 10 mg by mouth at bedtime.    ? nitroGLYCERIN (NITROSTAT) 0.4 MG SL tablet Place 1 tablet (0.4 mg total) under the tongue every 5 (five) minutes x 3 doses as needed for chest pain. 25 tablet 2  ? pantoprazole (PROTONIX) 40 MG tablet Take 40 mg by mouth daily.  3  ? polyethylene glycol-electrolytes (NULYTELY) 420 g solution As directed 4000 mL 0  ? promethazine (PHENERGAN) 25 MG tablet Take 25 mg by mouth 3 (three) times daily as needed for nausea or vomiting.  99  ? simvastatin (ZOCOR) 20 MG tablet Take 20 mg by mouth daily.    ? sodium chloride (OCEAN) 0.65 % SOLN nasal spray Place 1 spray into both nostrils as needed for congestion.    ? torsemide (DEMADEX) 5 MG tablet Take 5 mg by mouth daily.    ? VASCEPA 1 g capsule Take 2 g by mouth 2 (two) times daily.    ? dicyclomine (BENTYL) 10 MG capsule Take 1 capsule (10 mg total) by mouth 4 (four) times daily -  before meals and at bedtime. 120 capsule 5  ? ?No facility-administered medications prior to visit.  ? ? ? ?Objective:  ?  ? ?BP 134/86 (BP Location: Left Arm, Patient Position: Sitting)   Pulse 86   Temp 98.4 ?F (36.9 ?C) (Temporal)   Ht '5\' 3"'$  (1.6 m)   Wt 268 lb 12.8 oz (121.9 kg)   SpO2 98% Comment: ra  BMI 47.62 kg/m?  ? ?SpO2: 98 % (ra) obese wf nad classic pseudowheeze ? ? HEENT : pt wearing mask not removed for exam due to covid -19 concerns.  ? ? ?NECK :  without JVD/Nodes/TM/ nl carotid upstrokes bilaterally ? ? ?LUNGS: no acc muscle use,  Nl contour chest which is clear to A and P bilaterally without cough on insp or exp maneuvers ? ? ?CV:  RRR  no s3 or murmur or increase in P2, and no edema  ? ?ABD:  quite obese/ soft and nontender with nl inspiratory excursion in the supine position. No bruits or organomegaly appreciated,  bowel sounds nl ? ?MS:  slow gait with cane/ ext warm without deformities, calf tenderness, cyanosis or clubbing ?No obvious joint restrictions  ? ?SKIN: warm and dry without lesions   ? ?NEURO:  alert, approp, nl sensorium with  no motor or cerebellar deficits apparent.  ? ? ?CXR PA and Lateral:   05/07/2021 :    ?I personally reviewed images and agree with radiology impression as follows:    ? ?Labs ordered 05/07/2021  :  allergy profile     ?   ?Assessment  ? ?Upper airway cough syndrome ?Onset around 2017 while on acei and dose doubled since ?- d/c acei 05/07/2021 and max gerd rx  ?- Allergy screen 05/07/2021 >  Eos 0. /  IgE  Pending ? ? ?When respiratory symptoms begin or become refractory well after a patient reports complete smoking cessation,  Especially when this wasn't the case while they were smoking, a red flag is raised based on the work of Dr Kris Mouton which states: ? if you quit smoking when your best day FEV1 is still well preserved it is highly unlikely you will progress to severe disease. ? ?That is to say, once the smoking stops,  the symptoms should not suddenly erupt or markedly worsen.  If so, the differential diagnosis should include  obesity/deconditioning,  LPR/Reflux/Aspiration syndromes,  occult CHF, or  especially side effect of medications commonly used in this population ie acei.   ? ?Upper airway cough syndrome (previously labeled PNDS),  is so named because it's frequently impossible to sort out how much is  CR/sinusitis with freq throat clearing (which can be related to primary GERD)   vs  causing  secondary (" extra esophageal")  GERD from wide swings in gastric pressure that occur with throat clearing, often  promoting self use of mint and menthol lozenges that reduce the lower esophageal sphincter tone and exacerbate the problem further in a cyclical fashion.  ? ?These are the same pts (now being labeled as having "irritable larynx syndrome" by some cough centers) who not infrequently  have a history of having failed to tolerate ace inhibitors,  dry powder inhalers or biphosphonates or report having atypical/extraesophageal reflux symptoms that don't respond to standard doses of PPI  and are easily

## 2021-05-07 NOTE — Assessment & Plan Note (Addendum)
Onset around 2017 while on acei and dose doubled since ?- d/c acei 05/07/2021 and max gerd rx  ?- Allergy screen 05/07/2021 >  Eos 0. /  IgE  Pending ? ? ?When respiratory symptoms begin or become refractory well after a patient reports complete smoking cessation,  Especially when this wasn't the case while they were smoking, a red flag is raised based on the work of Dr Kris Mouton which states: ? if you quit smoking when your best day FEV1 is still well preserved it is highly unlikely you will progress to severe disease. ? ?That is to say, once the smoking stops,  the symptoms should not suddenly erupt or markedly worsen.  If so, the differential diagnosis should include  obesity/deconditioning,  LPR/Reflux/Aspiration syndromes,  occult CHF, or  especially side effect of medications commonly used in this population ie acei.   ? ?Upper airway cough syndrome (previously labeled PNDS),  is so named because it's frequently impossible to sort out how much is  CR/sinusitis with freq throat clearing (which can be related to primary GERD)   vs  causing  secondary (" extra esophageal")  GERD from wide swings in gastric pressure that occur with throat clearing, often  promoting self use of mint and menthol lozenges that reduce the lower esophageal sphincter tone and exacerbate the problem further in a cyclical fashion.  ? ?These are the same pts (now being labeled as having "irritable larynx syndrome" by some cough centers) who not infrequently have a history of having failed to tolerate ace inhibitors,  dry powder inhalers or biphosphonates or report having atypical/extraesophageal reflux symptoms that don't respond to standard doses of PPI  and are easily confused as having aecopd or asthma flares by even experienced allergists/ pulmonologists (myself included).  ? ?>>> try off acei, on gerd rx and regroup in 6 weeks ?

## 2021-05-09 LAB — CBC WITH DIFFERENTIAL/PLATELET
Basophils Absolute: 0.1 10*3/uL (ref 0.0–0.2)
Basos: 1 %
EOS (ABSOLUTE): 0.4 10*3/uL (ref 0.0–0.4)
Eos: 4 %
Hematocrit: 42.7 % (ref 34.0–46.6)
Hemoglobin: 14.9 g/dL (ref 11.1–15.9)
Immature Grans (Abs): 0.1 10*3/uL (ref 0.0–0.1)
Immature Granulocytes: 1 %
Lymphocytes Absolute: 3.4 10*3/uL — ABNORMAL HIGH (ref 0.7–3.1)
Lymphs: 32 %
MCH: 31 pg (ref 26.6–33.0)
MCHC: 34.9 g/dL (ref 31.5–35.7)
MCV: 89 fL (ref 79–97)
Monocytes Absolute: 0.9 10*3/uL (ref 0.1–0.9)
Monocytes: 8 %
Neutrophils Absolute: 5.7 10*3/uL (ref 1.4–7.0)
Neutrophils: 54 %
Platelets: 313 10*3/uL (ref 150–450)
RBC: 4.8 x10E6/uL (ref 3.77–5.28)
RDW: 12.5 % (ref 11.7–15.4)
WBC: 10.6 10*3/uL (ref 3.4–10.8)

## 2021-05-09 LAB — IGE: IgE (Immunoglobulin E), Serum: 107 IU/mL (ref 6–495)

## 2021-05-11 ENCOUNTER — Other Ambulatory Visit: Payer: Self-pay

## 2021-05-11 DIAGNOSIS — R058 Other specified cough: Secondary | ICD-10-CM

## 2021-05-13 DIAGNOSIS — R070 Pain in throat: Secondary | ICD-10-CM | POA: Diagnosis not present

## 2021-05-13 DIAGNOSIS — J0100 Acute maxillary sinusitis, unspecified: Secondary | ICD-10-CM | POA: Diagnosis not present

## 2021-05-13 DIAGNOSIS — Z1152 Encounter for screening for COVID-19: Secondary | ICD-10-CM | POA: Diagnosis not present

## 2021-05-13 DIAGNOSIS — R059 Cough, unspecified: Secondary | ICD-10-CM | POA: Diagnosis not present

## 2021-05-13 DIAGNOSIS — H9202 Otalgia, left ear: Secondary | ICD-10-CM | POA: Diagnosis not present

## 2021-05-20 DIAGNOSIS — E785 Hyperlipidemia, unspecified: Secondary | ICD-10-CM | POA: Diagnosis not present

## 2021-05-20 DIAGNOSIS — N1831 Chronic kidney disease, stage 3a: Secondary | ICD-10-CM | POA: Diagnosis not present

## 2021-05-20 DIAGNOSIS — J449 Chronic obstructive pulmonary disease, unspecified: Secondary | ICD-10-CM | POA: Diagnosis not present

## 2021-05-20 DIAGNOSIS — E1142 Type 2 diabetes mellitus with diabetic polyneuropathy: Secondary | ICD-10-CM | POA: Diagnosis not present

## 2021-05-20 DIAGNOSIS — E559 Vitamin D deficiency, unspecified: Secondary | ICD-10-CM | POA: Diagnosis not present

## 2021-05-20 DIAGNOSIS — R69 Illness, unspecified: Secondary | ICD-10-CM | POA: Diagnosis not present

## 2021-05-20 DIAGNOSIS — I10 Essential (primary) hypertension: Secondary | ICD-10-CM | POA: Diagnosis not present

## 2021-05-20 DIAGNOSIS — Z0000 Encounter for general adult medical examination without abnormal findings: Secondary | ICD-10-CM | POA: Diagnosis not present

## 2021-05-25 ENCOUNTER — Ambulatory Visit (HOSPITAL_COMMUNITY)
Admission: RE | Admit: 2021-05-25 | Discharge: 2021-05-25 | Disposition: A | Discharge status: Home / Self Care | Payer: Medicare HMO | Source: Ambulatory Visit | Attending: Medical | Admitting: Medical

## 2021-05-25 DIAGNOSIS — R0602 Shortness of breath: Secondary | ICD-10-CM | POA: Insufficient documentation

## 2021-05-25 LAB — ECHOCARDIOGRAM COMPLETE
AR max vel: 2.18 cm2
AV Area VTI: 2.51 cm2
AV Area mean vel: 2.46 cm2
AV Mean grad: 4 mmHg
AV Peak grad: 9 mmHg
Ao pk vel: 1.5 m/s
Area-P 1/2: 2.99 cm2
S' Lateral: 2.4 cm

## 2021-05-25 NOTE — Progress Notes (Signed)
*  PRELIMINARY RESULTS* ?Echocardiogram ?2D Echocardiogram has been performed. ? ?Samuel Germany ?05/25/2021, 1:46 PM ?

## 2021-05-27 ENCOUNTER — Telehealth: Payer: Self-pay | Admitting: Cardiology

## 2021-05-27 NOTE — Telephone Encounter (Signed)
Patient was returning call to discuss Echo results  ?

## 2021-05-27 NOTE — Telephone Encounter (Signed)
Results discussed with patient, pcp copied 

## 2021-05-31 NOTE — Progress Notes (Deleted)
?Cardiology Office Note:   ? ?Date:  05/31/2021  ? ?ID:  [name], DOB 1950-04-16, MRN [id number] ? ?PCP:  Alanson Puls, [facility] ?  ?[facility] Providers ?Cardiologist:  None { ? ?Referring MD: [name], MD  ? ? ?History of Present Illness:   ? ?[name] is a 71 y.o. female with a hx of bipolar disorder, DMII, HTN, and prior CVA who presents to clinic for follow-up. ? ?Per review of the record, the patient was admitted  [facility] ED on 04/05/2018 after developing the chest pain while undergoing pulmonary function testing. She reported having episodes of discomfort for the past 6+ months along her entire precordium which could last for hours at a time and was not associated with exertion or positional changes. EKG showed no acute ischemic changes and cyclic troponin values were negative.  Echocardiogram was performed which showed a preserved EF of 60 to 65% with no regional wall motion abnormalities. No significant valve abnormalities identified either. An outpatient stress test was recommended for further evaluation. This was performed on 04/19/2018 and showed a small apical anterior septal defect which was thought to be most consistent with soft tissue attenuation and no definitive ischemia was identified. The study was overall low risk. ? ?Last saw Cadence Kathlen Mody, PA-C on 02/2021 where she reported worsening dyspnea as well as a prior episode of a "funny feeling" on the left side of her face. She thought she may have had a stroke, but did not seek medical care at that time. TTE 05/25/21 showed LVEF 60-65%, moderate LVH, grade 1DD, normal RV, small pericaridal effusion, RAP 74mHg. ? ?Today, **** ? ?Past Medical History:  ?Diagnosis Date  ? [place]   ? Cancer ([facility]   ? [place] ([place]   ? Hypertension   ? PONV (postoperative nausea and vomiting)   ? Stroke ([facility]   ? ? ?Past Surgical History:  ?Procedure Laterality Date  ? ABDOMINAL HYSTERECTOMY    ?  CHOLECYSTECTOMY    ? COLONOSCOPY WITH PROPOFOL N/A 10/06/2020  ? Procedure: COLONOSCOPY WITH PROPOFOL;  Surgeon: CEloise Harman DO;  [facility];  Service: Endoscopy;  Laterality: N/A;  8:15am  ? ORIF FEMUR FRACTURE Left 11/30/2016  ? Procedure: OPEN REDUCTION INTERNAL FIXATION (ORIF) DISTAL FEMUR FRACTURE;  Surgeon: SRod Can MD;  [place]  Service: [place]opedics;  Laterality: Left;  ? POLYPECTOMY  10/06/2020  ? Procedure: POLYPECTOMY INTESTINAL;  Surgeon: CEloise Harman DO;  [facility];  Service: Endoscopy;;  ? stroke    ? TONSILLECTOMY    ? ? ?Current Medications: ?No outpatient medications have been marked as taking for the 06/04/21 encounter (Appointment) with PFreada Bergeron MD.  ?  ? ?Allergies:   Ciprofloxacin, Latex, and Sulfa antibiotics  ? ?Social History  ? ?Socioeconomic History  ? Marital status: Married  ?  Spouse name: Not on file  ? Number of children: Not on file  ? Years of education: Not on file  ? Highest education level: Not on file  ?Occupational History  ? Not on file  ?Tobacco Use  ? Smoking status: Former  ?  Packs/day: 3.00  ?  Years: 20.00  ?  Pack years: 60.00  ?  Types: Cigarettes  ?  Quit date: 09/26/2000  ?  Years since quitting: 20.6  ? Smokeless tobacco: Never  ?Vaping Use  ? Vaping Use: Never used  ?Substance and Sexual Activity  ? Alcohol  use: No  ? Drug use: No  ? Sexual activity: Not Currently  ?Other Topics Concern  ? Not on file  ?Social History Narrative  ? Not on file  ? ?Social Determinants of Health  ? ?Financial Resource Strain: Not on file  ?Food Insecurity: Not on file  ?Transportation Needs: Not on file  ?Physical Activity: Not on file  ?Stress: Not on file  ?Social Connections: Not on file  ?  ? ?Family History: ?The patient's ***family history includes CAD in her brother and father. ? ?ROS:   ?Please see the history of present illness.    ?*** All other systems reviewed and are negative. ? ?EKGs/Labs/Other Studies  Reviewed:   ? ?The following studies were reviewed today: ?TTE 05/25/21: ?IMPRESSIONS  ? ? ? 1. Left ventricular ejection fraction, by estimation, is 60 to 65%. The  ?left ventricle has normal function. The left ventricle has no regional  ?wall motion abnormalities. There is moderate left ventricular hypertrophy.  ?Left ventricular diastolic  ?parameters are consistent with Grade I diastolic dysfunction (impaired  ?relaxation).  ? 2. Right ventricular systolic function is normal. The right ventricular  ?size is normal.  ? 3. A small pericardial effusion is present. The pericardial effusion is  ?circumferential. There is no evidence of cardiac tamponade.  ? 4. The mitral valve is normal in structure. No evidence of mitral valve  ?regurgitation. No evidence of mitral stenosis.  ? 5. The aortic valve is tricuspid. There is mild calcification of the  ?aortic valve. There is mild thickening of the aortic valve. Aortic valve  ?regurgitation is not visualized. No aortic stenosis is present.  ? 6. The inferior vena cava is dilated in size with <50% respiratory  ?variability, suggesting right atrial pressure of 15 mmHg. ? ?EKG:  EKG is *** ordered today.  The ekg ordered today demonstrates *** ? ?Recent Labs: ?03/05/2021: B Natriuretic Peptide 14.0; BUN 18; Creatinine, Ser 1.28; Potassium 3.7; Sodium 138 ?05/07/2021: Hemoglobin 14.9; Platelets 313  ?Recent Lipid Panel ?   ?Component Value Date/Time  ? CHOL 165 04/06/2018 0239  ? TRIG 333 (H) 04/06/2018 0239  ? HDL 44 04/06/2018 0239  ? CHOLHDL 3.8 04/06/2018 0239  ? VLDL 67 (H) 04/06/2018 0239  ? [place] 54 04/06/2018 0239  ? ? ? ?Risk Assessment/Calculations:   ?{Does this patient have ATRIAL FIBRILLATION?:[phone]} ? ?    ? ?Physical Exam:   ? ?VS:  There were no vitals taken for this visit.   ? ?Wt Readings from Last 3 Encounters:  ?05/07/21 268 lb 12.8 oz (121.9 kg)  ?03/05/21 265 lb (120.2 kg)  ?11/08/20 267 lb (121.1 kg)  ?  ? ?GEN: *** Well nourished, well developed  in no acute distress ?HEENT: Normal ?NECK: No JVD; No carotid bruits ?LYMPHATICS: No lymphadenopathy ?CARDIAC: ***RRR, no murmurs, rubs, gallops ?RESPIRATORY:  Clear to auscultation without rales, wheezing or rhonchi  ?ABDOMEN: Soft, non-tender, non-distended ?MUSCULOSKELETAL:  No edema; No deformity  ?SKIN: Warm and dry ?NEUROLOGIC:  Alert and oriented x 3 ?PSYCHIATRIC:  Normal affect  ? ?ASSESSMENT:   ? ?No diagnosis found. ?PLAN:   ? ?In order of problems listed above: ? ?#Suspected acute on chronic diastolic HF: ?#Dyspnea on Exertion: ? ?   ? ?{Are you ordering a CV Procedure (e.g. stress test, cath, DCCV, TEE, etc)?   Press F2        :[id number]}  ? ? ?Medication Adjustments/Labs and Tests Ordered: ?Current medicines are reviewed at length with the patient today.  Concerns regarding medicines are outlined above.  ?No orders of the defined types were placed in this encounter. ? ?No orders of the defined types were placed in this encounter. ? ? ?There are no Patient Instructions on file for this visit.  ? ?Signed, ?Freada Bergeron, MD  ?05/31/2021 3:42 PM    ?[place] ?

## 2021-06-04 ENCOUNTER — Ambulatory Visit: Payer: Medicare HMO | Admitting: Cardiology

## 2021-06-09 DIAGNOSIS — E1142 Type 2 diabetes mellitus with diabetic polyneuropathy: Secondary | ICD-10-CM | POA: Diagnosis not present

## 2021-06-09 DIAGNOSIS — Z1329 Encounter for screening for other suspected endocrine disorder: Secondary | ICD-10-CM | POA: Diagnosis not present

## 2021-06-09 DIAGNOSIS — Z130 Encounter for screening for diseases of the blood and blood-forming organs and certain disorders involving the immune mechanism: Secondary | ICD-10-CM | POA: Diagnosis not present

## 2021-06-09 DIAGNOSIS — E559 Vitamin D deficiency, unspecified: Secondary | ICD-10-CM | POA: Diagnosis not present

## 2021-06-09 DIAGNOSIS — I129 Hypertensive chronic kidney disease with stage 1 through stage 4 chronic kidney disease, or unspecified chronic kidney disease: Secondary | ICD-10-CM | POA: Diagnosis not present

## 2021-06-09 DIAGNOSIS — E785 Hyperlipidemia, unspecified: Secondary | ICD-10-CM | POA: Diagnosis not present

## 2021-06-12 ENCOUNTER — Ambulatory Visit: Payer: Medicare HMO | Admitting: Internal Medicine

## 2021-06-17 DIAGNOSIS — R0683 Snoring: Secondary | ICD-10-CM | POA: Diagnosis not present

## 2021-06-17 DIAGNOSIS — E6609 Other obesity due to excess calories: Secondary | ICD-10-CM | POA: Diagnosis not present

## 2021-06-17 DIAGNOSIS — R5383 Other fatigue: Secondary | ICD-10-CM | POA: Diagnosis not present

## 2021-06-17 DIAGNOSIS — E785 Hyperlipidemia, unspecified: Secondary | ICD-10-CM | POA: Diagnosis not present

## 2021-06-17 DIAGNOSIS — E119 Type 2 diabetes mellitus without complications: Secondary | ICD-10-CM | POA: Diagnosis not present

## 2021-07-01 ENCOUNTER — Ambulatory Visit: Payer: Medicare HMO | Admitting: Gastroenterology

## 2021-07-10 ENCOUNTER — Ambulatory Visit: Payer: Medicare HMO | Admitting: Internal Medicine

## 2021-07-23 ENCOUNTER — Other Ambulatory Visit: Payer: Self-pay | Admitting: Internal Medicine

## 2021-09-03 DIAGNOSIS — E785 Hyperlipidemia, unspecified: Secondary | ICD-10-CM | POA: Diagnosis not present

## 2021-09-03 DIAGNOSIS — E1142 Type 2 diabetes mellitus with diabetic polyneuropathy: Secondary | ICD-10-CM | POA: Diagnosis not present

## 2021-09-09 ENCOUNTER — Ambulatory Visit: Payer: Medicare HMO | Admitting: Internal Medicine

## 2021-09-09 DIAGNOSIS — I10 Essential (primary) hypertension: Secondary | ICD-10-CM | POA: Diagnosis not present

## 2021-09-09 DIAGNOSIS — N1831 Chronic kidney disease, stage 3a: Secondary | ICD-10-CM | POA: Diagnosis not present

## 2021-09-09 DIAGNOSIS — E119 Type 2 diabetes mellitus without complications: Secondary | ICD-10-CM | POA: Diagnosis not present

## 2021-09-09 DIAGNOSIS — M2550 Pain in unspecified joint: Secondary | ICD-10-CM | POA: Diagnosis not present

## 2021-09-09 DIAGNOSIS — Z79899 Other long term (current) drug therapy: Secondary | ICD-10-CM | POA: Diagnosis not present

## 2021-09-09 DIAGNOSIS — I129 Hypertensive chronic kidney disease with stage 1 through stage 4 chronic kidney disease, or unspecified chronic kidney disease: Secondary | ICD-10-CM | POA: Diagnosis not present

## 2021-09-09 DIAGNOSIS — E785 Hyperlipidemia, unspecified: Secondary | ICD-10-CM | POA: Diagnosis not present

## 2021-09-15 ENCOUNTER — Other Ambulatory Visit: Payer: Self-pay | Admitting: Internal Medicine

## 2021-09-15 NOTE — Telephone Encounter (Signed)
Sending in limited refill.  Needs to schedule follow-up with Dr. Abbey Chatters in the next month or so as she has not been seen in 1 year.

## 2021-09-16 ENCOUNTER — Encounter: Payer: Self-pay | Admitting: Internal Medicine

## 2021-09-16 NOTE — Telephone Encounter (Signed)
Phoned and advised the pt that her Rx was approved

## 2021-10-05 ENCOUNTER — Encounter: Payer: Self-pay | Admitting: Gastroenterology

## 2021-10-05 NOTE — Progress Notes (Unsigned)
Referring Provider: Alanson Puls The [facility] Primary Care Physician:  [facility] Primary GI Physician: Dr. Abbey Chatters  No chief complaint on file.   HPI:   [name] is a 71 y.o. female with history of chronic GERD, adenomatous colon polyps due for surveillance in 2027, presenting today ***     Past Medical History:  Diagnosis Date   Bipolar 1 disorder ([place])    Cancer ([place])    Diabetes mellitus without complication ([place])    Hypertension    PONV (postoperative nausea and vomiting)    Stroke [facility])     Past Surgical History:  Procedure Laterality Date   ABDOMINAL HYSTERECTOMY     CHOLECYSTECTOMY     COLONOSCOPY WITH PROPOFOL N/A 10/06/2020   Procedure: COLONOSCOPY WITH PROPOFOL;  Surgeon: Eloise Harman, DO;  [facility];  Service: Endoscopy;  Laterality: N/A;  8:15am   ORIF FEMUR FRACTURE Left 11/30/2016   Procedure: OPEN REDUCTION INTERNAL FIXATION (ORIF) DISTAL FEMUR FRACTURE;  Surgeon: Rod Can, MD;  Location: [place];  Service: Orthopedics;  Laterality: Left;   POLYPECTOMY  10/06/2020   Procedure: POLYPECTOMY INTESTINAL;  Surgeon: Eloise Harman, DO;  [facility];  Service: Endoscopy;;   stroke     TONSILLECTOMY      Current Outpatient Medications  Medication Sig Dispense Refill   albuterol (PROVENTIL HFA;VENTOLIN HFA) 108 (90 Base) MCG/ACT inhaler Inhale 2 puffs into the lungs every 8 (eight) hours as needed for shortness of breath or wheezing.  3   aspirin EC 81 MG tablet Take 81 mg by mouth daily.     BYDUREON 2 MG PEN Inject 2 mg as directed every Tuesday.  0   cyclobenzaprine (FLEXERIL) 10 MG tablet Take 1 tablet (10 mg total) by mouth 2 (two) times daily as needed for muscle spasms. 20 tablet 0   dicyclomine (BENTYL) 10 MG capsule TAKE 1 CAPSULE BY MOUTH 4 TIMES DAILY (BEFORE MEAL(S) AND AT BEDTIME) 120 capsule 0   famotidine (PEPCID) 20 MG tablet One after supper 30 tablet 11   gabapentin (NEURONTIN) 100 MG  capsule Take 100 mg by mouth 2 (two) times daily.     hydrochlorothiazide (HYDRODIURIL) 25 MG tablet Take 25 mg by mouth daily.  0   Ibuprofen-Acetaminophen (ADVIL DUAL ACTION) 125-250 MG TABS Take 2 tablets by mouth every 8 (eight) hours as needed (pain).     insulin detemir (LEVEMIR) 100 UNIT/ML injection Inject 0.18 mLs (18 Units total) into the skin at bedtime. 10 mL    irbesartan (AVAPRO) 300 MG tablet Take 1 tablet (300 mg total) by mouth daily. 30 tablet 11   Lidocaine (SALONPAS PAIN RELIEVING) 4 % PTCH Apply 1 each topically every 12 (twelve) hours. 15 patch 1   meclizine (ANTIVERT) 25 MG tablet Take 25 mg by mouth 2 (two) times daily as needed for dizziness.     metFORMIN (GLUCOPHAGE) 500 MG tablet Take 500 mg by mouth 2 (two) times daily with a meal.     metoprolol tartrate (LOPRESSOR) 50 MG tablet Take 50 mg by mouth 2 (two) times daily.  6   montelukast (SINGULAIR) 10 MG tablet Take 10 mg by mouth at bedtime.     nitroGLYCERIN (NITROSTAT) 0.4 MG SL tablet Place 1 tablet (0.4 mg total) under the tongue every 5 (five) minutes x 3 doses as needed for chest pain. 25 tablet 2   pantoprazole (PROTONIX) 40 MG tablet Take 40 mg by mouth daily.  3   polyethylene glycol-electrolytes (NULYTELY) 420 g solution As directed 4000 mL 0   promethazine (PHENERGAN) 25 MG tablet Take 25 mg by mouth 3 (three) times daily as needed for nausea or vomiting.  99   simvastatin (ZOCOR) 20 MG tablet Take 20 mg by mouth daily.     sodium chloride (OCEAN) 0.65 % SOLN nasal spray Place 1 spray into both nostrils as needed for congestion.     torsemide (DEMADEX) 5 MG tablet Take 5 mg by mouth daily.     VASCEPA 1 g capsule Take 2 g by mouth 2 (two) times daily.     No current facility-administered medications for this visit.    Allergies as of 10/07/2021 - Review Complete 05/07/2021  Allergen Reaction Noted   Ciprofloxacin Diarrhea and Nausea And Vomiting 09/30/2020   Latex Itching 11/30/2016   Sulfa  antibiotics Itching and Rash 11/29/2016    Family History  Problem Relation Age of Onset   CAD Father    CAD Brother     Social History   Socioeconomic History   Marital status: Married    Spouse name: Not on file   Number of children: Not on file   Years of education: Not on file   Highest education level: Not on file  Occupational History   Not on file  Tobacco Use   Smoking status: Former    Packs/day: 3.00    Years: 20.00    Total pack years: 60.00    Types: Cigarettes    Quit date: 09/26/2000    Years since quitting: 21.0   Smokeless tobacco: Never  Vaping Use   Vaping Use: Never used  Substance and Sexual Activity   Alcohol use: No   Drug use: No   Sexual activity: Not Currently  Other Topics Concern   Not on file  Social History Narrative   Not on file   Social Determinants of Health   Financial Resource Strain: Not on file  Food Insecurity: Not on file  Transportation Needs: Not on file  Physical Activity: Not on file  Stress: Not on file  Social Connections: Not on file    Review of Systems: Gen: Denies fever, chills, cold or flulike symptoms, presyncope, syncope. CV: Denies chest pain, palpitations. Resp: Denies dyspnea at rest, cough. GI: See HPI Heme: See HPI  Physical Exam: There were no vitals taken for this visit. General:   Alert and oriented. No distress noted. Pleasant and cooperative.  Head:  Normocephalic and atraumatic. Eyes:  Conjuctiva clear without scleral icterus. Heart:  S1, S2 present without murmurs appreciated. Lungs:  Clear to auscultation bilaterally. No wheezes, rales, or rhonchi. No distress.  Abdomen:  +BS, soft, non-tender and non-distended. No rebound or guarding. No HSM or masses noted. Msk:  Symmetrical without gross deformities. Normal posture. Extremities:  Without edema. Neurologic:  Alert and  oriented x4 Psych:  Normal mood and affect.    Assessment:     Plan:  ***   Aliene Altes,  PA-C [facility] 10/07/2021

## 2021-10-07 ENCOUNTER — Encounter: Payer: Self-pay | Admitting: Gastroenterology

## 2021-10-07 ENCOUNTER — Ambulatory Visit (INDEPENDENT_AMBULATORY_CARE_PROVIDER_SITE_OTHER): Payer: Medicare HMO | Admitting: Gastroenterology

## 2021-10-07 VITALS — BP 140/69 | HR 91 | Temp 97.8°F | Ht 63.0 in | Wt 269.0 lb

## 2021-10-07 DIAGNOSIS — R110 Nausea: Secondary | ICD-10-CM

## 2021-10-07 DIAGNOSIS — K5900 Constipation, unspecified: Secondary | ICD-10-CM

## 2021-10-07 DIAGNOSIS — R1031 Right lower quadrant pain: Secondary | ICD-10-CM

## 2021-10-07 NOTE — Patient Instructions (Signed)
Start Linzess 145 mcg every day 30 minutes before breakfast.  We are providing you with samples.  Please call with a progress report in 1-2 weeks.  Monitor your abdominal pain and nausea.  If you have any worsening symptoms, please let me know.  If your symptoms do not improve with better management of constipation, we will arrange for a CT of your abdomen/pelvis as well as blood work.  We will plan for routine follow-up in 3 months.  It was very nice to meet you today!  Aliene Altes, PA-C [facility]

## 2021-10-19 ENCOUNTER — Telehealth: Payer: Self-pay | Admitting: *Deleted

## 2021-10-19 NOTE — Telephone Encounter (Signed)
Spoke to pt, informed to come pick up samples and call in a couple weeks with an update. Pt voiced understanding.

## 2021-10-19 NOTE — Telephone Encounter (Signed)
Spoke to pt, she informed me that she was given Linzess 168mg at last OV and they are not working. She would like to increase the dose.

## 2021-10-19 NOTE — Telephone Encounter (Signed)
Please provide samples of Linzess 290 mcg x3. Have her call with a progress report in a couple of weeks.

## 2021-10-21 ENCOUNTER — Ambulatory Visit: Payer: Medicare HMO | Admitting: Internal Medicine

## 2021-11-03 DIAGNOSIS — Z6841 Body Mass Index (BMI) 40.0 and over, adult: Secondary | ICD-10-CM | POA: Diagnosis not present

## 2021-11-03 DIAGNOSIS — Z713 Dietary counseling and surveillance: Secondary | ICD-10-CM | POA: Diagnosis not present

## 2021-11-03 DIAGNOSIS — I10 Essential (primary) hypertension: Secondary | ICD-10-CM | POA: Diagnosis not present

## 2021-11-04 ENCOUNTER — Telehealth: Payer: Self-pay | Admitting: *Deleted

## 2021-11-04 ENCOUNTER — Other Ambulatory Visit: Payer: Self-pay | Admitting: Gastroenterology

## 2021-11-04 DIAGNOSIS — K581 Irritable bowel syndrome with constipation: Secondary | ICD-10-CM

## 2021-11-04 MED ORDER — LINACLOTIDE 290 MCG PO CAPS: 290.0000 ug | ORAL_CAPSULE | Freq: Every day | ORAL | 3 refills | Status: DC

## 2021-11-04 NOTE — Telephone Encounter (Signed)
Rx sent 

## 2021-11-04 NOTE — Telephone Encounter (Signed)
Pt called and states that Linzess 290 is working well for her and she would like a prescription sent to pharmacy.

## 2021-11-04 NOTE — Telephone Encounter (Signed)
Noted  

## 2021-11-30 ENCOUNTER — Institutional Professional Consult (permissible substitution): Payer: Medicare HMO | Admitting: Neurology

## 2021-12-02 ENCOUNTER — Ambulatory Visit: Payer: Medicare HMO | Admitting: Internal Medicine

## 2021-12-10 DIAGNOSIS — I10 Essential (primary) hypertension: Secondary | ICD-10-CM | POA: Diagnosis not present

## 2021-12-10 DIAGNOSIS — E7849 Other hyperlipidemia: Secondary | ICD-10-CM | POA: Diagnosis not present

## 2021-12-10 DIAGNOSIS — E119 Type 2 diabetes mellitus without complications: Secondary | ICD-10-CM | POA: Diagnosis not present

## 2021-12-14 ENCOUNTER — Other Ambulatory Visit: Payer: Self-pay | Admitting: Gastroenterology

## 2021-12-16 DIAGNOSIS — I129 Hypertensive chronic kidney disease with stage 1 through stage 4 chronic kidney disease, or unspecified chronic kidney disease: Secondary | ICD-10-CM | POA: Diagnosis not present

## 2021-12-16 DIAGNOSIS — E1142 Type 2 diabetes mellitus with diabetic polyneuropathy: Secondary | ICD-10-CM | POA: Diagnosis not present

## 2021-12-16 DIAGNOSIS — J3089 Other allergic rhinitis: Secondary | ICD-10-CM | POA: Diagnosis not present

## 2021-12-16 DIAGNOSIS — E1122 Type 2 diabetes mellitus with diabetic chronic kidney disease: Secondary | ICD-10-CM | POA: Diagnosis not present

## 2021-12-16 DIAGNOSIS — E7849 Other hyperlipidemia: Secondary | ICD-10-CM | POA: Diagnosis not present

## 2021-12-16 DIAGNOSIS — Z7984 Long term (current) use of oral hypoglycemic drugs: Secondary | ICD-10-CM | POA: Diagnosis not present

## 2021-12-16 DIAGNOSIS — R42 Dizziness and giddiness: Secondary | ICD-10-CM | POA: Diagnosis not present

## 2021-12-16 DIAGNOSIS — J449 Chronic obstructive pulmonary disease, unspecified: Secondary | ICD-10-CM | POA: Diagnosis not present

## 2021-12-16 DIAGNOSIS — N1831 Chronic kidney disease, stage 3a: Secondary | ICD-10-CM | POA: Diagnosis not present

## 2021-12-16 DIAGNOSIS — Z79899 Other long term (current) drug therapy: Secondary | ICD-10-CM | POA: Diagnosis not present

## 2021-12-24 DIAGNOSIS — R39198 Other difficulties with micturition: Secondary | ICD-10-CM | POA: Diagnosis not present

## 2021-12-24 DIAGNOSIS — N3090 Cystitis, unspecified without hematuria: Secondary | ICD-10-CM | POA: Diagnosis not present

## 2021-12-24 DIAGNOSIS — R300 Dysuria: Secondary | ICD-10-CM | POA: Diagnosis not present

## 2021-12-28 ENCOUNTER — Institutional Professional Consult (permissible substitution): Payer: Medicare HMO | Admitting: Neurology

## 2022-01-07 ENCOUNTER — Ambulatory Visit: Payer: Medicare HMO | Admitting: Gastroenterology

## 2022-01-20 ENCOUNTER — Ambulatory Visit: Payer: Medicare HMO | Admitting: Gastroenterology

## 2022-01-20 ENCOUNTER — Institutional Professional Consult (permissible substitution): Payer: Medicare HMO | Admitting: Neurology

## 2022-01-21 DIAGNOSIS — E119 Type 2 diabetes mellitus without complications: Secondary | ICD-10-CM | POA: Diagnosis not present

## 2022-01-21 DIAGNOSIS — R252 Cramp and spasm: Secondary | ICD-10-CM | POA: Diagnosis not present

## 2022-01-21 DIAGNOSIS — J4489 Other specified chronic obstructive pulmonary disease: Secondary | ICD-10-CM | POA: Diagnosis not present

## 2022-01-25 ENCOUNTER — Institutional Professional Consult (permissible substitution): Payer: Medicare HMO | Admitting: Neurology

## 2022-01-27 ENCOUNTER — Encounter: Payer: Self-pay | Admitting: Gastroenterology

## 2022-01-27 ENCOUNTER — Ambulatory Visit (INDEPENDENT_AMBULATORY_CARE_PROVIDER_SITE_OTHER): Payer: Medicare HMO | Admitting: Gastroenterology

## 2022-01-27 VITALS — BP 127/65 | HR 87 | Temp 97.1°F | Ht 63.0 in | Wt 264.3 lb

## 2022-01-27 DIAGNOSIS — K5900 Constipation, unspecified: Secondary | ICD-10-CM | POA: Diagnosis not present

## 2022-01-27 DIAGNOSIS — K219 Gastro-esophageal reflux disease without esophagitis: Secondary | ICD-10-CM

## 2022-01-27 NOTE — Patient Instructions (Signed)
I have decreased your dosage of Linzess to 145 mcg. Take this once each morning, 30 minutes before breakfast. Please call if this is not helpful!  We will see you in 6 months!  It was a pleasure to see you today. I want to create trusting relationships with patients to provide genuine, compassionate, and quality care. I value your feedback. If you receive a survey regarding your visit,  I greatly appreciate you taking time to fill this out.   Annitta Needs, PhD, ANP-BC [facility]

## 2022-01-27 NOTE — Progress Notes (Signed)
Gastroenterology Office Note     Primary Care Physician:  Alanson Puls, [facility]  Primary Gastroenterologist: Dr. Abbey Chatters    Chief Complaint   Chief Complaint  Patient presents with   Follow-up    Patient here today for a three month follow up on IBS with Constipation. She says she is not having any issues with runny stools now as she says stools look like muddy water. She is on Linzess 290 mcg daily,and she wants to decrease the mcg to 145 mcg and she would like some samples of this.      History of Present Illness   [name] is a 71 y.o. female presenting today in follow-up with a history of chronic GERD, adenomatous colon polyps due for surveillance in 2027, and constipation.   At last visit, she was started on Linzess 145 mcg daily, then increased to 290 mcg daily. She notes stool is now runny and has urgency. Would like to transition back to 145 mcg daily. No rectal bleeding. No N/V.   GERD controlled with pantoprazole once daily. Was given dicyclomine to take sparingly if abdominal cramps. She is taking this sparingly with good results.   Past Medical History:  Diagnosis Date   Bipolar 1 disorder ([place])    Cancer ([place])    Diabetes mellitus without complication ([place])    Hypertension    PONV (postoperative nausea and vomiting)    Stroke [facility])     Past Surgical History:  Procedure Laterality Date   ABDOMINAL HYSTERECTOMY     CHOLECYSTECTOMY     COLONOSCOPY WITH PROPOFOL N/A 10/06/2020   Surgeon: Eloise Harman, DO; nonbleeding internal hemorrhoids, 5 mm tubular adenoma removed, 3 mm hyperplastic polyp removed, otherwise normal exam.  Recommended 5-year surveillance.   ORIF FEMUR FRACTURE Left 11/30/2016   Procedure: OPEN REDUCTION INTERNAL FIXATION (ORIF) DISTAL FEMUR FRACTURE;  Surgeon: Rod Can, MD;  Location: [facility];  Service: Orthopedics;  Laterality: Left;   POLYPECTOMY  10/06/2020   Procedure: POLYPECTOMY INTESTINAL;  Surgeon: Eloise Harman, DO;  Location: [facility];  Service: Endoscopy;;   stroke     TONSILLECTOMY      Current Outpatient Medications  Medication Sig Dispense Refill   albuterol (2.5 MG/3ML) 0.083% NEBU 3 mL, albuterol (5 MG/ML) 0.5% NEBU 0.5 mL Inhale into the lungs every 12 (twelve) hours. As needed     albuterol (PROVENTIL HFA;VENTOLIN HFA) 108 (90 Base) MCG/ACT inhaler Inhale 2 puffs into the lungs every 8 (eight) hours as needed for shortness of breath or wheezing.  3   aspirin EC 81 MG tablet Take 81 mg by mouth daily.     BYDUREON 2 MG PEN Inject 2 mg as directed. Every Thursday.  0   cyclobenzaprine (FLEXERIL) 10 MG tablet Take 1 tablet (10 mg total) by mouth 2 (two) times daily as needed for muscle spasms. 20 tablet 0   dicyclomine (BENTYL) 10 MG capsule Take 1 capsule (10 mg total) by mouth 3 (three) times daily as needed (abdominal cramping or diarrhea. Hold in setting of constipation.). 120 capsule 1   famotidine (PEPCID) 20 MG tablet One after supper 30 tablet 11   fluticasone (FLONASE) 50 MCG/ACT nasal spray Place 2 sprays into both nostrils daily.     gabapentin (NEURONTIN) 100 MG capsule Take 100 mg by mouth 3 (three) times daily.     hydrochlorothiazide (HYDRODIURIL) 25 MG tablet Take 25 mg by mouth daily.  0   Ibuprofen-Acetaminophen (  ADVIL DUAL ACTION) 125-250 MG TABS Take 2 tablets by mouth every 8 (eight) hours as needed (pain).     insulin detemir (LEVEMIR) 100 UNIT/ML injection Inject 0.18 mLs (18 Units total) into the skin at bedtime. 10 mL    Insulin Pen Needle 31G X 5 MM MISC by Does not apply route.     irbesartan (AVAPRO) 300 MG tablet Take 1 tablet (300 mg total) by mouth daily. 30 tablet 11   Lidocaine (SALONPAS PAIN RELIEVING) 4 % PTCH Apply 1 each topically every 12 (twelve) hours. 15 patch 1   linaclotide (LINZESS) 290 MCG CAPS capsule Take 1 capsule (290 mcg total) by mouth daily before breakfast. 30 capsule 3   meclizine (ANTIVERT) 25 MG tablet Take 25 mg by mouth 2  (two) times daily as needed for dizziness.     metFORMIN (GLUCOPHAGE) 500 MG tablet Take 500 mg by mouth 2 (two) times daily with a meal.     Metoprolol Tartrate 75 MG TABS Take 75 mg by mouth 3 (three) times daily.  6   montelukast (SINGULAIR) 10 MG tablet Take 10 mg by mouth at bedtime.     nitroGLYCERIN (NITROSTAT) 0.4 MG SL tablet Place 1 tablet (0.4 mg total) under the tongue every 5 (five) minutes x 3 doses as needed for chest pain. 25 tablet 2   OVER THE COUNTER MEDICATION One touch Verio Reflect glucose machine.  One touch Verio Reflect glucose test strips 1-2 daily  One touch delica plus Lancets one lancet to check glucose bid.     pantoprazole (PROTONIX) 40 MG tablet Take 40 mg by mouth daily.  3   promethazine (PHENERGAN) 25 MG tablet Take 25 mg by mouth. Two times per day.  99   simvastatin (ZOCOR) 20 MG tablet Take 20 mg by mouth daily.     sodium chloride (OCEAN) 0.65 % SOLN nasal spray Place 1 spray into both nostrils as needed for congestion.     VASCEPA 1 g capsule Take 2 g by mouth 2 (two) times daily.     No current facility-administered medications for this visit.    Allergies as of 01/27/2022 - Review Complete 01/27/2022  Allergen Reaction Noted   Ciprofloxacin Diarrhea and Nausea And Vomiting 09/30/2020   Latex Itching 11/30/2016   Sulfa antibiotics Itching and Rash 11/29/2016    Family History  Problem Relation Age of Onset   CAD Father    CAD Brother     Social History   Socioeconomic History   Marital status: Married    Spouse name: Not on file   Number of children: Not on file   Years of education: Not on file   Highest education level: Not on file  Occupational History   Not on file  Tobacco Use   Smoking status: Former    Packs/day: 3.00    Years: 20.00    Total pack years: 60.00    Types: Cigarettes    Quit date: 09/26/2000    Years since quitting: 21.3   Smokeless tobacco: Never  Vaping Use   Vaping Use: Never used  Substance and Sexual  Activity   Alcohol use: No   Drug use: No   Sexual activity: Not Currently  Other Topics Concern   Not on file  Social History Narrative   Not on file   Social Determinants of Health   Financial Resource Strain: Not on file  Food Insecurity: Not on file  Transportation Needs: Not on file  Physical Activity:  Not on file  Stress: Not on file  Social Connections: Not on file  Intimate Partner Violence: Not on file     Review of Systems   Gen: Denies any fever, chills, fatigue, weight loss, lack of appetite.  CV: Denies chest pain, heart palpitations, peripheral edema, syncope.  Resp: Denies shortness of breath at rest or with exertion. Denies wheezing or cough.  GI: Denies dysphagia or odynophagia. Denies jaundice, hematemesis, fecal incontinence. GU : Denies urinary burning, urinary frequency, urinary hesitancy MS: Denies joint pain, muscle weakness, cramps, or limitation of movement.  Derm: Denies rash, itching, dry skin Psych: Denies depression, anxiety, memory loss, and confusion Heme: Denies bruising, bleeding, and enlarged lymph nodes.   Physical Exam   BP 127/65 (BP Location: Left Arm, Patient Position: Sitting, Cuff Size: Large)   Pulse 87   Temp (!) 97.1 F (36.2 C) (Temporal)   Ht '5\' 3"'$  (1.6 m)   Wt 264 lb 4.8 oz (119.9 kg)   BMI 46.82 kg/m  General:   Alert and oriented. Pleasant and cooperative. Well-nourished and well-developed.  Head:  Normocephalic and atraumatic. Eyes:  Without icterus Abdomen:  +BS, soft, non-tender and non-distended. No HSM noted. No guarding or rebound. No masses appreciated.  Rectal:  Deferred  Msk:  Symmetrical without gross deformities. Normal posture. Extremities:  Without edema. Neurologic:  Alert and  oriented x4;  grossly normal neurologically. Skin:  Intact without significant lesions or rashes. Psych:  Alert and cooperative. Normal mood and affect.   Assessment   [name] is a 71 y.o. female presenting today  in follow-up with a history of  chronic GERD, adenomatous colon polyps due for surveillance in 2027, and constipation.   Constipation: Linzess 290 mcg overshooting the mark. Will decrease to Linzess 145 mcg daily. May need to add Miralax to this to get to a "sweet spot".   GERD: doing well on PPI daily.    PLAN    Linzess 145 mcg samples provided Call with progress report Continue PPI daily 6 month follow-up    Annitta Needs, PhD, ANP-BC [facility] Gastroenterology

## 2022-02-24 DIAGNOSIS — H52223 Regular astigmatism, bilateral: Secondary | ICD-10-CM | POA: Diagnosis not present

## 2022-03-11 ENCOUNTER — Institutional Professional Consult (permissible substitution): Payer: Medicare HMO | Admitting: Neurology

## 2022-03-18 ENCOUNTER — Other Ambulatory Visit: Payer: Self-pay | Admitting: Gastroenterology

## 2022-03-18 DIAGNOSIS — K581 Irritable bowel syndrome with constipation: Secondary | ICD-10-CM

## 2022-03-25 DIAGNOSIS — Z6841 Body Mass Index (BMI) 40.0 and over, adult: Secondary | ICD-10-CM | POA: Diagnosis not present

## 2022-03-25 DIAGNOSIS — E1142 Type 2 diabetes mellitus with diabetic polyneuropathy: Secondary | ICD-10-CM | POA: Diagnosis not present

## 2022-03-25 DIAGNOSIS — E119 Type 2 diabetes mellitus without complications: Secondary | ICD-10-CM | POA: Diagnosis not present

## 2022-03-25 DIAGNOSIS — E7849 Other hyperlipidemia: Secondary | ICD-10-CM | POA: Diagnosis not present

## 2022-03-30 DIAGNOSIS — R42 Dizziness and giddiness: Secondary | ICD-10-CM | POA: Diagnosis not present

## 2022-03-30 DIAGNOSIS — E7849 Other hyperlipidemia: Secondary | ICD-10-CM | POA: Diagnosis not present

## 2022-03-30 DIAGNOSIS — E1142 Type 2 diabetes mellitus with diabetic polyneuropathy: Secondary | ICD-10-CM | POA: Diagnosis not present

## 2022-03-30 DIAGNOSIS — E1122 Type 2 diabetes mellitus with diabetic chronic kidney disease: Secondary | ICD-10-CM | POA: Diagnosis not present

## 2022-03-30 DIAGNOSIS — Z7984 Long term (current) use of oral hypoglycemic drugs: Secondary | ICD-10-CM | POA: Diagnosis not present

## 2022-03-30 DIAGNOSIS — K219 Gastro-esophageal reflux disease without esophagitis: Secondary | ICD-10-CM | POA: Diagnosis not present

## 2022-03-30 DIAGNOSIS — J3089 Other allergic rhinitis: Secondary | ICD-10-CM | POA: Diagnosis not present

## 2022-03-30 DIAGNOSIS — K581 Irritable bowel syndrome with constipation: Secondary | ICD-10-CM | POA: Diagnosis not present

## 2022-03-30 DIAGNOSIS — Z79899 Other long term (current) drug therapy: Secondary | ICD-10-CM | POA: Diagnosis not present

## 2022-03-30 DIAGNOSIS — I129 Hypertensive chronic kidney disease with stage 1 through stage 4 chronic kidney disease, or unspecified chronic kidney disease: Secondary | ICD-10-CM | POA: Diagnosis not present

## 2022-03-30 DIAGNOSIS — N1831 Chronic kidney disease, stage 3a: Secondary | ICD-10-CM | POA: Diagnosis not present

## 2022-03-30 DIAGNOSIS — M5459 Other low back pain: Secondary | ICD-10-CM | POA: Diagnosis not present

## 2022-04-05 DIAGNOSIS — E1122 Type 2 diabetes mellitus with diabetic chronic kidney disease: Secondary | ICD-10-CM | POA: Insufficient documentation

## 2022-04-05 DIAGNOSIS — M545 Low back pain, unspecified: Secondary | ICD-10-CM | POA: Insufficient documentation

## 2022-04-06 ENCOUNTER — Institutional Professional Consult (permissible substitution): Payer: Medicare HMO | Admitting: Neurology

## 2022-04-15 ENCOUNTER — Other Ambulatory Visit: Payer: Self-pay | Admitting: Internal Medicine

## 2022-04-15 ENCOUNTER — Other Ambulatory Visit: Payer: Self-pay | Admitting: Gastroenterology

## 2022-04-15 DIAGNOSIS — K581 Irritable bowel syndrome with constipation: Secondary | ICD-10-CM

## 2022-04-23 ENCOUNTER — Other Ambulatory Visit: Payer: Self-pay | Admitting: Internal Medicine

## 2022-04-26 ENCOUNTER — Institutional Professional Consult (permissible substitution): Payer: Medicare HMO | Admitting: Neurology

## 2022-04-26 ENCOUNTER — Telehealth: Payer: Self-pay | Admitting: *Deleted

## 2022-04-26 ENCOUNTER — Telehealth: Payer: Self-pay

## 2022-04-26 DIAGNOSIS — R058 Other specified cough: Secondary | ICD-10-CM

## 2022-04-26 MED ORDER — FAMOTIDINE 20 MG PO TABS: ORAL_TABLET | ORAL | 3 refills | Status: AC

## 2022-04-26 MED ORDER — IRBESARTAN 300 MG PO TABS: 300.0000 mg | ORAL_TABLET | Freq: Every day | ORAL | 0 refills | Status: DC

## 2022-04-26 NOTE — Telephone Encounter (Signed)
Refills sent.  ATC pt to notify of response and no answer. LVM letting her know to call back to make F/u appt  Nothing further needed

## 2022-04-26 NOTE — Telephone Encounter (Signed)
noted 

## 2022-04-26 NOTE — Telephone Encounter (Signed)
Documentation from the pt's insurance regarding her Rx for Dicyclomine Cap. It is not on her formulary but if this Rx is written for less than 120 caps (such as 30 caps) they will allow multiple fills. Documentation on your desk in the yellow folder

## 2022-04-26 NOTE — Telephone Encounter (Signed)
Patient called and states she is out of refills for irbesartan 300 mg and famotidine '20mg'$ . Patient uses [facility] in [place] and would like a call when prescription is in.   Patients last ov March of 2023.   Dr. Melvyn Novas are you okay with [place] refilling these for patient?

## 2022-04-26 NOTE — Telephone Encounter (Signed)
X 3 m only  then either needs ov with me or PCP(prefer latter) going forwrdd

## 2022-04-26 NOTE — Telephone Encounter (Signed)
Noted. She is actually on Linzess for constipation and takes dicyclomine very sparingly if cramping. She is aware that this can be counter productive if not careful; however, it has worked for her in the past. I am scanning into her chart. We don't need to adjust any medications as she does not need refill at this time.

## 2022-05-03 DIAGNOSIS — R059 Cough, unspecified: Secondary | ICD-10-CM | POA: Diagnosis not present

## 2022-05-03 DIAGNOSIS — J441 Chronic obstructive pulmonary disease with (acute) exacerbation: Secondary | ICD-10-CM | POA: Diagnosis not present

## 2022-05-03 DIAGNOSIS — R509 Fever, unspecified: Secondary | ICD-10-CM | POA: Diagnosis not present

## 2022-05-14 DIAGNOSIS — I10 Essential (primary) hypertension: Secondary | ICD-10-CM | POA: Diagnosis not present

## 2022-05-14 DIAGNOSIS — R42 Dizziness and giddiness: Secondary | ICD-10-CM | POA: Diagnosis not present

## 2022-05-14 DIAGNOSIS — E782 Mixed hyperlipidemia: Secondary | ICD-10-CM | POA: Diagnosis not present

## 2022-05-14 DIAGNOSIS — J441 Chronic obstructive pulmonary disease with (acute) exacerbation: Secondary | ICD-10-CM | POA: Diagnosis not present

## 2022-05-14 DIAGNOSIS — K219 Gastro-esophageal reflux disease without esophagitis: Secondary | ICD-10-CM | POA: Diagnosis not present

## 2022-06-07 ENCOUNTER — Ambulatory Visit: Payer: Medicare HMO | Admitting: Internal Medicine

## 2022-06-22 DIAGNOSIS — I10 Essential (primary) hypertension: Secondary | ICD-10-CM | POA: Diagnosis not present

## 2022-06-22 DIAGNOSIS — E785 Hyperlipidemia, unspecified: Secondary | ICD-10-CM | POA: Diagnosis not present

## 2022-06-22 DIAGNOSIS — K219 Gastro-esophageal reflux disease without esophagitis: Secondary | ICD-10-CM | POA: Diagnosis not present

## 2022-06-22 DIAGNOSIS — J309 Allergic rhinitis, unspecified: Secondary | ICD-10-CM | POA: Diagnosis not present

## 2022-06-22 DIAGNOSIS — Z0001 Encounter for general adult medical examination with abnormal findings: Secondary | ICD-10-CM | POA: Diagnosis not present

## 2022-06-22 DIAGNOSIS — K589 Irritable bowel syndrome without diarrhea: Secondary | ICD-10-CM | POA: Diagnosis not present

## 2022-06-22 DIAGNOSIS — R42 Dizziness and giddiness: Secondary | ICD-10-CM | POA: Diagnosis not present

## 2022-06-22 DIAGNOSIS — Z79899 Other long term (current) drug therapy: Secondary | ICD-10-CM | POA: Diagnosis not present

## 2022-06-22 DIAGNOSIS — E1142 Type 2 diabetes mellitus with diabetic polyneuropathy: Secondary | ICD-10-CM | POA: Diagnosis not present

## 2022-06-22 DIAGNOSIS — E876 Hypokalemia: Secondary | ICD-10-CM | POA: Diagnosis not present

## 2022-07-05 ENCOUNTER — Institutional Professional Consult (permissible substitution): Payer: Medicare HMO | Admitting: Neurology

## 2022-07-08 ENCOUNTER — Emergency Department (HOSPITAL_COMMUNITY)
Admission: EM | Admit: 2022-07-08 | Discharge: 2022-07-08 | Disposition: A | Discharge status: Home / Self Care | Payer: Medicare HMO | Attending: Emergency Medicine | Admitting: Emergency Medicine

## 2022-07-08 ENCOUNTER — Other Ambulatory Visit: Payer: Self-pay

## 2022-07-08 DIAGNOSIS — Z91040 Latex allergy status: Secondary | ICD-10-CM | POA: Diagnosis not present

## 2022-07-08 DIAGNOSIS — Z794 Long term (current) use of insulin: Secondary | ICD-10-CM | POA: Diagnosis not present

## 2022-07-08 DIAGNOSIS — Z7984 Long term (current) use of oral hypoglycemic drugs: Secondary | ICD-10-CM | POA: Diagnosis not present

## 2022-07-08 DIAGNOSIS — I10 Essential (primary) hypertension: Secondary | ICD-10-CM | POA: Insufficient documentation

## 2022-07-08 DIAGNOSIS — Z79899 Other long term (current) drug therapy: Secondary | ICD-10-CM | POA: Diagnosis not present

## 2022-07-08 DIAGNOSIS — E119 Type 2 diabetes mellitus without complications: Secondary | ICD-10-CM | POA: Diagnosis not present

## 2022-07-08 DIAGNOSIS — Z743 Need for continuous supervision: Secondary | ICD-10-CM | POA: Diagnosis not present

## 2022-07-08 DIAGNOSIS — Z7982 Long term (current) use of aspirin: Secondary | ICD-10-CM | POA: Insufficient documentation

## 2022-07-08 DIAGNOSIS — R040 Epistaxis: Secondary | ICD-10-CM | POA: Diagnosis not present

## 2022-07-08 MED ORDER — OXYMETAZOLINE HCL 0.05 % NA SOLN
1.0000 | Freq: Once | NASAL | Status: AC
Start: 2022-07-08 — End: 2022-07-08
  Administered 2022-07-08: 1 via NASAL
  Filled 2022-07-08: qty 30

## 2022-07-08 NOTE — ED Provider Notes (Signed)
[place] EMERGENCY DEPARTMENT AT [facility] Provider Note   CSN: [id number] Arrival date & time: 07/08/22  1832     History  No chief complaint on file.   [name] is a 72 y.o. female.  HPI   This patient is a [age]-year-old female, she takes baby aspirin but no other anticoagulants.  History of hypertension high cholesterol and diabetes.  Presents from home after having an acute onset of nosebleed, she does not know if it was left-sided or right-sided, and she did get a bit in her mouth and her throat, she has no complaints at this time.  The bleeding seems to stop.  She denies headache, visual changes, chest pain shortness of breath coughing or any other complaints.  Home Medications Prior to Admission medications   Medication Sig Start Date End Date Taking? Authorizing Provider  albuterol (2.5 MG/3ML) 0.083% NEBU 3 mL, albuterol (5 MG/ML) 0.5% NEBU 0.5 mL Inhale into the lungs every 12 (twelve) hours. As needed    [provider]  albuterol (PROVENTIL HFA;VENTOLIN HFA) 108 (90 Base) MCG/ACT inhaler Inhale 2 puffs into the lungs every 8 (eight) hours as needed for shortness of breath or wheezing. 12/13/17   [provider]  aspirin EC 81 MG tablet Take 81 mg by mouth daily.    [provider]  BYDUREON 2 MG PEN Inject 2 mg as directed. Every Thursday. 10/18/17   [provider]  cyclobenzaprine (FLEXERIL) 10 MG tablet Take 1 tablet (10 mg total) by mouth 2 (two) times daily as needed for muscle spasms. 11/08/20   Kommor, [place], MD  dicyclomine (BENTYL) 10 MG capsule Take 1 capsule (10 mg total) by mouth 3 (three) times daily as needed (abdominal cramping or diarrhea. Hold in setting of constipation.). 12/15/21   Letta Median, PA-C  famotidine (PEPCID) 20 MG tablet One after supper 04/26/22   Nyoka Cowden, MD  fluticasone [facility]) 50 MCG/ACT nasal spray Place 2 sprays into both nostrils daily.    [provider]   gabapentin (NEURONTIN) 100 MG capsule Take 100 mg by mouth 3 (three) times daily. 06/12/20   [provider]  hydrochlorothiazide (HYDRODIURIL) 25 MG tablet Take 25 mg by mouth daily. 11/06/17   [provider]  Ibuprofen-Acetaminophen (ADVIL DUAL ACTION) 125-250 MG TABS Take 2 tablets by mouth every 8 (eight) hours as needed (pain).    [provider]  insulin detemir (LEVEMIR) 100 UNIT/ML injection Inject 0.18 mLs (18 Units total) into the skin at bedtime. 04/27/18   Strader, Lennart Pall, PA-C  Insulin Pen Needle 31G X 5 MM MISC by Does not apply route.    [provider]  irbesartan (AVAPRO) 300 MG tablet Take 1 tablet (300 mg total) by mouth daily. 04/26/22   Nyoka Cowden, MD  Lidocaine (SALONPAS PAIN RELIEVING) 4 % PTCH Apply 1 each topically every 12 (twelve) hours. 11/08/20   Kommor, Wyn Forster, MD  linaclotide [facility]) 290 MCG CAPS capsule TAKE 1 CAPSULE BY MOUTH ONCE DAILY BEFORE BREAKFAST 04/15/22   Gelene Mink, NP  meclizine (ANTIVERT) 25 MG tablet Take 25 mg by mouth 2 (two) times daily as needed for dizziness. 03/16/18   [provider]  metFORMIN (GLUCOPHAGE) 500 MG tablet Take 500 mg by mouth 2 (two) times daily with a meal.    [provider]  Metoprolol Tartrate 75 MG TABS Take 75 mg by mouth 3 (three) times daily. 08/30/17   [provider]  montelukast (SINGULAIR) 10 MG tablet Take 10 mg by mouth at bedtime.    [provider]  nitroGLYCERIN (NITROSTAT) 0.4 MG SL tablet Place 1 tablet (0.4 mg total) under the tongue every 5 (five) minutes x 3 doses as needed for chest pain. 04/27/18   Strader, Lennart Pall, PA-C  OVER THE COUNTER MEDICATION One touch Verio Reflect glucose machine.  One touch Verio Reflect glucose test strips 1-2 daily  One touch delica plus Lancets one lancet to check glucose bid.    [provider]  pantoprazole (PROTONIX) 40 MG tablet Take 40 mg by mouth daily. 11/28/17   [provider]  promethazine (PHENERGAN) 25 MG tablet Take 25 mg by mouth. Two times per day. 12/02/17   [provider]  simvastatin (ZOCOR) 20 MG tablet Take 20 mg by mouth daily. 02/10/21   [provider]  sodium chloride (OCEAN) 0.65 % SOLN nasal spray Place 1 spray into both nostrils as needed for congestion.    [provider]  VASCEPA 1 g capsule Take 2 g by mouth 2 (two) times daily. 06/17/20   [provider]      Allergies    Ciprofloxacin, Latex, and Sulfa antibiotics    Review of Systems   Review of Systems  All other systems reviewed and are negative.   Physical Exam Updated Vital Signs There were no vitals taken for this visit. Physical Exam Vitals and nursing note reviewed.  Constitutional:      General: She is not in acute distress.    Appearance: She is well-developed.  HENT:     Head: Normocephalic and atraumatic.     Nose:     Comments: Dried blood in the bilateral nares, dried blood in the back of the throat, no active bleeding, nasal speculum on otoscope used to explore the entire anterior nasal chamber, no active bleeding is seen    Mouth/Throat:     Pharynx: No oropharyngeal exudate.  Eyes:     General: No scleral icterus.       Right eye: No discharge.        Left eye: No discharge.     Conjunctiva/sclera: Conjunctivae normal.     Pupils: Pupils are equal, round, and reactive to light.  Neck:     Thyroid: No thyromegaly.     Vascular: No JVD.  Cardiovascular:     Rate and Rhythm: Normal rate and regular rhythm.     Heart sounds: Normal heart sounds. No murmur heard.    No friction rub. No gallop.  Pulmonary:     Effort: Pulmonary effort is normal. No respiratory distress.     Breath sounds: Normal breath sounds. No wheezing or rales.  Abdominal:     General: Bowel sounds are normal. There is no distension.     Palpations: Abdomen is soft. There is no mass.     Tenderness: There is no abdominal tenderness.   Musculoskeletal:        General: No tenderness. Normal range of motion.     Cervical back: Normal range of motion and neck supple.     Right lower leg: No edema.     Left lower leg: No edema.  Lymphadenopathy:     Cervical: No cervical adenopathy.  Skin:    General: Skin is warm and dry.     Findings: No erythema or rash.  Neurological:     Mental Status: She is alert.     Coordination: Coordination normal.  Psychiatric:  Behavior: Behavior normal.     ED Results / Procedures / Treatments   Labs (all labs ordered are listed, but only abnormal results are displayed) Labs Reviewed - No data to display  EKG None  Radiology No results found.  Procedures Procedures    Medications Ordered in ED Medications - No data to display  ED Course/ Medical Decision Making/ A&P Clinical Course as of 07/08/22 2109  Thu Jul 08, 2022  1943 Rechecked at 7:40 PM, no ongoing bleeding [BM]  2107 Checked again at 8:00 and then at 9:00, still no bleeding, Afrin given, seems to have resolved, vital signs normal [BM]    Clinical Course User Index [BM] Eber Hong, MD                             Medical Decision Making Risk OTC drugs.    This patient presents to the ED for concern of nosebleed differential diagnosis includes spontaneous epistaxis, she has been sneezing so this may be related to some mild trauma to the nose, no other injuries    Additional history obtained:  Additional history obtained from medical record External records from outside source obtained and reviewed including multiple office visits for diabetes, hypertension, follows closely with her doctor   Lab Tests:  I Ordered, and personally interpreted labs.  The pertinent results include: Not indicated   Imaging Studies ordered:  Not indicated  Medicines ordered and prescription drug management:  I ordered medication including Afrin for Afrin Reevaluation of the patient after these medicines  showed that the patient solved I have reviewed the patients home medicines and have made adjustments as needed   Problem List / ED Course:  Patient had multiple different evaluations throughout her course over several hours, her blood pressure normalized to 127/66 without medications, 1 spray of Afrin in each nostril with complete resolution of bleeding, she has no headache, no chest pain or shortness of breath and is not having any difficulty at this time, she is stable for discharge   Social Determinants of Health:             Final Clinical Impression(s) / ED Diagnoses Final diagnoses:  None    Rx / DC Orders ED Discharge Orders     None         Eber Hong, MD 07/08/22 2109

## 2022-07-08 NOTE — ED Notes (Signed)
Per EMS, Afrin was used x4 on each nostril with little to no success

## 2022-07-08 NOTE — ED Triage Notes (Signed)
BIB by [employer]. Pt takes BP meds regularly. Meds have been changed a few times between doctors. EMS VS BP 210/160

## 2022-07-08 NOTE — Discharge Instructions (Signed)
If you continue to have nosebleeds please use 1 spray of Afrin in each nostril, hold pressure on your nose until the bleeding stops, if it continues to bleed you may need to return to the ER.

## 2022-07-13 ENCOUNTER — Telehealth: Payer: Self-pay

## 2022-07-13 NOTE — Telephone Encounter (Signed)
Transition Care Management Follow-up Telephone Call Date of discharge and from where: [name] 5/16 How have you been since you were released from the hospital? [facility] but some dizzy spells Any questions or concerns? No  Items Reviewed: Did the pt receive and understand the discharge instructions provided? Yes  Medications obtained and verified? No  Other? No  Any new allergies since your discharge? No  Dietary orders reviewed? No Do you have support at home? Yes     Follow up appointments reviewed:  PCP Hospital f/u appt confirmed? No  Scheduled to see  on  @ . Specialist Hospital f/u appt confirmed? No  Scheduled to see  on  @ . Are transportation arrangements needed? No  If their condition worsens, is the pt aware to call PCP or go to the Emergency Dept.? Yes Was the patient provided with contact information for the PCP's office or ED? Yes Was to pt encouraged to call back with questions or concerns? Yes

## 2022-07-27 ENCOUNTER — Ambulatory Visit: Payer: Medicare HMO | Admitting: Internal Medicine

## 2022-07-27 DIAGNOSIS — I10 Essential (primary) hypertension: Secondary | ICD-10-CM | POA: Diagnosis not present

## 2022-07-27 DIAGNOSIS — K219 Gastro-esophageal reflux disease without esophagitis: Secondary | ICD-10-CM | POA: Diagnosis not present

## 2022-07-27 DIAGNOSIS — J4489 Other specified chronic obstructive pulmonary disease: Secondary | ICD-10-CM | POA: Diagnosis not present

## 2022-07-29 ENCOUNTER — Ambulatory Visit: Payer: Medicare HMO | Admitting: Gastroenterology

## 2022-08-09 ENCOUNTER — Emergency Department (HOSPITAL_COMMUNITY): Payer: Medicare HMO

## 2022-08-09 ENCOUNTER — Encounter (HOSPITAL_COMMUNITY): Payer: Self-pay

## 2022-08-09 ENCOUNTER — Other Ambulatory Visit: Payer: Self-pay

## 2022-08-09 ENCOUNTER — Observation Stay (HOSPITAL_COMMUNITY)
Admission: EM | Admit: 2022-08-09 | Discharge: 2022-08-10 | Disposition: A | Discharge status: Home Health | Payer: Medicare HMO | Attending: Family Medicine | Admitting: Family Medicine

## 2022-08-09 DIAGNOSIS — R200 Anesthesia of skin: Principal | ICD-10-CM

## 2022-08-09 DIAGNOSIS — I639 Cerebral infarction, unspecified: Secondary | ICD-10-CM

## 2022-08-09 DIAGNOSIS — Z794 Long term (current) use of insulin: Secondary | ICD-10-CM | POA: Insufficient documentation

## 2022-08-09 DIAGNOSIS — R4781 Slurred speech: Secondary | ICD-10-CM | POA: Diagnosis not present

## 2022-08-09 DIAGNOSIS — Z7984 Long term (current) use of oral hypoglycemic drugs: Secondary | ICD-10-CM | POA: Diagnosis not present

## 2022-08-09 DIAGNOSIS — I10 Essential (primary) hypertension: Secondary | ICD-10-CM | POA: Diagnosis not present

## 2022-08-09 DIAGNOSIS — Z87891 Personal history of nicotine dependence: Secondary | ICD-10-CM | POA: Insufficient documentation

## 2022-08-09 DIAGNOSIS — F319 Bipolar disorder, unspecified: Secondary | ICD-10-CM | POA: Diagnosis present

## 2022-08-09 DIAGNOSIS — Z8673 Personal history of transient ischemic attack (TIA), and cerebral infarction without residual deficits: Secondary | ICD-10-CM | POA: Diagnosis not present

## 2022-08-09 DIAGNOSIS — Z7982 Long term (current) use of aspirin: Secondary | ICD-10-CM | POA: Insufficient documentation

## 2022-08-09 DIAGNOSIS — Z79899 Other long term (current) drug therapy: Secondary | ICD-10-CM | POA: Insufficient documentation

## 2022-08-09 DIAGNOSIS — E119 Type 2 diabetes mellitus without complications: Secondary | ICD-10-CM

## 2022-08-09 DIAGNOSIS — R29818 Other symptoms and signs involving the nervous system: Secondary | ICD-10-CM | POA: Diagnosis not present

## 2022-08-09 DIAGNOSIS — R29810 Facial weakness: Secondary | ICD-10-CM | POA: Diagnosis not present

## 2022-08-09 DIAGNOSIS — R2990 Unspecified symptoms and signs involving the nervous system: Secondary | ICD-10-CM

## 2022-08-09 LAB — COMPREHENSIVE METABOLIC PANEL
ALT: 25 U/L (ref 0–44)
AST: 31 U/L (ref 15–41)
Albumin: 3.9 g/dL (ref 3.5–5.0)
Alkaline Phosphatase: 67 U/L (ref 38–126)
Anion gap: 11 (ref 5–15)
BUN: 17 mg/dL (ref 8–23)
CO2: 23 mmol/L (ref 22–32)
Calcium: 9.1 mg/dL (ref 8.9–10.3)
Chloride: 102 mmol/L (ref 98–111)
Creatinine, Ser: 1 mg/dL (ref 0.44–1.00)
GFR, Estimated: >60 mL/min (ref >60)
Glucose, Bld: 194 mg/dL — ABNORMAL HIGH (ref 70–99)
Potassium: 3.5 mmol/L (ref 3.5–5.1)
Sodium: 136 mmol/L (ref 135–145)
Total Bilirubin: 0.5 mg/dL (ref 0.3–1.2)
Total Protein: 7 g/dL (ref 6.5–8.1)

## 2022-08-09 LAB — URINALYSIS, ROUTINE W REFLEX MICROSCOPIC
Bilirubin Urine: NEGATIVE
Glucose, UA: NEGATIVE mg/dL
Ketones, ur: NEGATIVE mg/dL
Nitrite: POSITIVE — AB
Protein, ur: NEGATIVE mg/dL
Specific Gravity, Urine: 1.006 (ref 1.005–1.030)
pH: 5 (ref 5.0–8.0)

## 2022-08-09 LAB — CBC
HCT: 39.2 % (ref 36.0–46.0)
Hemoglobin: 13.3 g/dL (ref 12.0–15.0)
MCH: 31.3 pg (ref 26.0–34.0)
MCHC: 33.9 g/dL (ref 30.0–36.0)
MCV: 92.2 fL (ref 80.0–100.0)
Platelets: 306 10*3/uL (ref 150–400)
RBC: 4.25 MIL/uL (ref 3.87–5.11)
RDW: 12.7 % (ref 11.5–15.5)
WBC: 8.5 10*3/uL (ref 4.0–10.5)
nRBC: 0 % (ref 0.0–0.2)

## 2022-08-09 LAB — DIFFERENTIAL
Abs Immature Granulocytes: 0.02 10*3/uL (ref 0.00–0.07)
Basophils Absolute: 0.1 10*3/uL (ref 0.0–0.1)
Basophils Relative: 1 %
Eosinophils Absolute: 0.4 10*3/uL (ref 0.0–0.5)
Eosinophils Relative: 5 %
Immature Granulocytes: 0 %
Lymphocytes Relative: 29 %
Lymphs Abs: 2.5 10*3/uL (ref 0.7–4.0)
Monocytes Absolute: 0.6 10*3/uL (ref 0.1–1.0)
Monocytes Relative: 7 %
Neutro Abs: 4.9 10*3/uL (ref 1.7–7.7)
Neutrophils Relative %: 58 %

## 2022-08-09 LAB — I-STAT CHEM 8, ED
BUN: 16 mg/dL (ref 8–23)
Calcium, Ion: 1.2 mmol/L (ref 1.15–1.40)
Chloride: 102 mmol/L (ref 98–111)
Creatinine, Ser: 1 mg/dL (ref 0.44–1.00)
Glucose, Bld: 191 mg/dL — ABNORMAL HIGH (ref 70–99)
HCT: 39 % (ref 36.0–46.0)
Hemoglobin: 13.3 g/dL (ref 12.0–15.0)
Potassium: 3.5 mmol/L (ref 3.5–5.1)
Sodium: 139 mmol/L (ref 135–145)
TCO2: 26 mmol/L (ref 22–32)

## 2022-08-09 LAB — PROTIME-INR
INR: 1.1 (ref 0.8–1.2)
Prothrombin Time: 14.4 seconds (ref 11.4–15.2)

## 2022-08-09 LAB — RAPID URINE DRUG SCREEN, HOSP PERFORMED
Amphetamines: NOT DETECTED
Barbiturates: NOT DETECTED
Benzodiazepines: NOT DETECTED
Cocaine: NOT DETECTED
Opiates: NOT DETECTED
Tetrahydrocannabinol: NOT DETECTED

## 2022-08-09 LAB — CBG MONITORING, ED: Glucose-Capillary: 184 mg/dL — ABNORMAL HIGH (ref 70–99)

## 2022-08-09 LAB — GLUCOSE, CAPILLARY: Glucose-Capillary: 100 mg/dL — ABNORMAL HIGH (ref 70–99)

## 2022-08-09 LAB — APTT: aPTT: 23 seconds — ABNORMAL LOW (ref 24–36)

## 2022-08-09 LAB — ETHANOL: Alcohol, Ethyl (B): <10 mg/dL (ref <10)

## 2022-08-09 MED ORDER — ACETAMINOPHEN 325 MG PO TABS
650.0000 mg | ORAL_TABLET | ORAL | Status: DC | PRN
  Administered 2022-08-10: 650 mg via ORAL
  Filled 2022-08-09: qty 2

## 2022-08-09 MED ORDER — ACETAMINOPHEN 650 MG RE SUPP: 650.0000 mg | RECTAL | Status: DC | PRN

## 2022-08-09 MED ORDER — ASPIRIN 325 MG PO TABS
325.0000 mg | ORAL_TABLET | Freq: Once | ORAL | Status: AC
  Administered 2022-08-09: 325 mg via ORAL
  Filled 2022-08-09: qty 1

## 2022-08-09 MED ORDER — STROKE: EARLY STAGES OF RECOVERY BOOK: Freq: Once | Status: AC

## 2022-08-09 MED ORDER — ATORVASTATIN CALCIUM 40 MG PO TABS
40.0000 mg | ORAL_TABLET | Freq: Every day | ORAL | Status: DC
  Administered 2022-08-09 – 2022-08-10 (×2): 40 mg via ORAL
  Filled 2022-08-09 (×2): qty 1

## 2022-08-09 MED ORDER — BASAGLAR KWIKPEN 100 UNIT/ML ~~LOC~~ SOPN: 12.0000 [IU] | PEN_INJECTOR | Freq: Every day | SUBCUTANEOUS | Status: DC

## 2022-08-09 MED ORDER — IOHEXOL 350 MG/ML SOLN
80.0000 mL | Freq: Once | INTRAVENOUS | Status: AC | PRN
  Administered 2022-08-09: 75 mL via INTRAVENOUS

## 2022-08-09 MED ORDER — POTASSIUM CHLORIDE 20 MEQ PO PACK
40.0000 meq | PACK | Freq: Once | ORAL | Status: AC
  Administered 2022-08-09: 40 meq via ORAL
  Filled 2022-08-09: qty 2

## 2022-08-09 MED ORDER — GABAPENTIN 300 MG PO CAPS
300.0000 mg | ORAL_CAPSULE | Freq: Three times a day (TID) | ORAL | Status: DC
  Administered 2022-08-09 – 2022-08-10 (×2): 300 mg via ORAL
  Filled 2022-08-09 (×2): qty 1

## 2022-08-09 MED ORDER — INSULIN ASPART 100 UNIT/ML IJ SOLN: 0.0000 [IU] | Freq: Every day | INTRAMUSCULAR | Status: DC

## 2022-08-09 MED ORDER — SENNOSIDES-DOCUSATE SODIUM 8.6-50 MG PO TABS: 1.0000 | ORAL_TABLET | Freq: Every evening | ORAL | Status: DC | PRN

## 2022-08-09 MED ORDER — ENOXAPARIN SODIUM 40 MG/0.4ML IJ SOSY
40.0000 mg | PREFILLED_SYRINGE | INTRAMUSCULAR | Status: DC
  Administered 2022-08-09: 40 mg via SUBCUTANEOUS
  Filled 2022-08-09: qty 0.4

## 2022-08-09 MED ORDER — INSULIN GLARGINE-YFGN 100 UNIT/ML ~~LOC~~ SOLN
12.0000 [IU] | Freq: Every day | SUBCUTANEOUS | Status: DC
  Filled 2022-08-09 (×2): qty 0.12

## 2022-08-09 MED ORDER — ACETAMINOPHEN 160 MG/5ML PO SOLN: 650.0000 mg | ORAL | Status: DC | PRN

## 2022-08-09 MED ORDER — ASPIRIN 81 MG PO TBEC
81.0000 mg | DELAYED_RELEASE_TABLET | Freq: Every day | ORAL | Status: DC
  Administered 2022-08-10: 81 mg via ORAL
  Filled 2022-08-09: qty 1

## 2022-08-09 MED ORDER — INSULIN ASPART 100 UNIT/ML IJ SOLN
0.0000 [IU] | Freq: Three times a day (TID) | INTRAMUSCULAR | Status: DC
  Administered 2022-08-10 (×2): 3 [IU] via SUBCUTANEOUS

## 2022-08-09 MED ORDER — SODIUM CHLORIDE 0.9 % IV SOLN
1.0000 g | Freq: Once | INTRAVENOUS | Status: AC
  Administered 2022-08-09: 1 g via INTRAVENOUS
  Filled 2022-08-09: qty 10

## 2022-08-09 NOTE — Assessment & Plan Note (Signed)
Stable. -Allow for permissive hypertension, hold HCTZ 25 mg, irbesartan 300, metoprolol 75 -As needed labetalol for systolic greater than 200

## 2022-08-09 NOTE — ED Notes (Signed)
Pt back from CT

## 2022-08-09 NOTE — Assessment & Plan Note (Signed)
-   Resume Lantus at reduced dose 12 u nits at bedtime( Home dose 18u) - SSI- M -Hold metformin - HgbA1c

## 2022-08-09 NOTE — ED Triage Notes (Signed)
Pt comes in with lt side facial droop starting approximately 3 days ago. Pt states she had intermittent nose bleed from the lt nostril when she noticed the facial droop and it continues to get worse since. Pt states "feels like when they numb your mouth for toothache." Pt states numbness from the lt side of face, down lt arm, and numbness bilaterally legs.

## 2022-08-09 NOTE — ED Notes (Signed)
Patient transported to CT 

## 2022-08-09 NOTE — H&P (Signed)
History and Physical    WILLAMENA [name] QMV:[id number] DOB: 31-Jan-1951 DOA: 08/09/2022  PCP: [facility] Ort [facility]   Patient coming from: Home  I have personally briefly reviewed patient's old medical records in [facility]  Chief Complaint: Left sided numbness  HPI: [name] is a 72 y.o. female with medical history significant for hypertension, diabetes, bipolar disorder, stroke, bipolar disorder. Patient presented to ED with left facial droop for the past 3 days, numbness to the left side of her face, with numbness to upper and lower extremities.  She also reports weakness to her left leg, feeling like her leg is heavy.  She reports she has had difficulty grasping objects with her left hand-she is not sure if this is from numbness or weakness.  She reports change in speech over the past 3 days because she is unable to move the left side of her mouth. At baseline she ambulates with a cane.  ED Course: Stable vitals.  UDS unremarkable.  Head CT unremarkable.  MRI brain negative for acute intracranial abnormality.  EKG shows sinus rhythm. EDP talked to neurologist Dr. Selina Cooley, recommended admission for clinical stroke symptoms.  Patient may have suffered an MRI negative stroke. UA obtained in the ED, with positive nitrites small leukocytes, IV ceftriaxone also started for possible UTI.  Review of Systems: As per HPI all other systems reviewed and negative.  Past Medical History:  Diagnosis Date   Bipolar 1 disorder (HCC)    Cancer (HCC)    Diabetes mellitus without complication (HCC)    Hypertension    PONV (postoperative nausea and vomiting)    Stroke [facility])     Past Surgical History:  Procedure Laterality Date   ABDOMINAL HYSTERECTOMY     CHOLECYSTECTOMY     COLONOSCOPY WITH PROPOFOL N/A 10/06/2020   Surgeon: Lanelle Bal, DO; nonbleeding internal hemorrhoids, 5 mm tubular adenoma removed, 3 mm hyperplastic polyp removed, otherwise normal exam.  Recommended  5-year surveillance.   ORIF FEMUR FRACTURE Left 11/30/2016   Procedure: OPEN REDUCTION INTERNAL FIXATION (ORIF) DISTAL FEMUR FRACTURE;  Surgeon: Samson Frederic, MD;  Location: [facility] OR;  Service: Orthopedics;  Laterality: Left;   POLYPECTOMY  10/06/2020   Procedure: POLYPECTOMY INTESTINAL;  Surgeon: Lanelle Bal, DO;  Location: [facility] END[facility] SUITE;  Service: Endoscopy;;   stroke     TONSILLECTOMY       reports that she quit smoking about 21 years ago. Her smoking use included cigarettes. She has a 60.00 pack-year smoking history. She has never used smokeless tobacco. She reports that she does not drink alcohol and does not use drugs.  Allergies  Allergen Reactions   Ciprofloxacin Diarrhea and Nausea And Vomiting    stomach pain   Latex Itching   Sulfa Antibiotics Itching and Rash    Family History  Problem Relation Age of Onset   CAD Father    CAD Brother     Prior to Admission medications   Medication Sig Start Date End Date Taking? Authorizing Provider  albuterol (2.5 MG/3ML) 0.083% NEBU 3 mL, albuterol (5 MG/ML) 0.5% NEBU 0.5 mL Inhale into the lungs every 12 (twelve) hours. As needed    [provider]  albuterol (PROVENTIL HFA;VENTOLIN HFA) 108 (90 Base) MCG/ACT inhaler Inhale 2 puffs into the lungs every 8 (eight) hours as needed for shortness of breath or wheezing. 12/13/17   [provider]  aspirin EC 81 MG tablet Take 81 mg by mouth daily.    [provider]  BYDUREON 2 MG PEN Inject 2 mg as directed. Every Sunday. 10/18/17   [provider]  cyclobenzaprine (FLEXERIL) 10 MG tablet Take 1 tablet (10 mg total) by mouth 2 (two) times daily as needed for muscle spasms. 11/08/20   Kommor, Madison, MD  dicyclomine (BENTYL) 10 MG capsule Take 1 capsule (10 mg total) by mouth 3 (three) times daily as needed (abdominal cramping or diarrhea. Hold in setting of constipation.). 12/15/21   Letta Median, PA-C  famotidine (PEPCID) 20 MG tablet One  after supper Patient taking differently: Take 20 mg by mouth daily. 04/26/22   Nyoka Cowden, MD  fluticasone (FLONASE) 50 MCG/ACT nasal spray Place 2 sprays into both nostrils daily.    [provider]  gabapentin (NEURONTIN) 300 MG capsule Take 300 mg by mouth 3 (three) times daily. 07/05/22   [provider]  hydrochlorothiazide (HYDRODIURIL) 25 MG tablet Take 25 mg by mouth daily. 11/06/17   [provider]  Ibuprofen-Acetaminophen (ADVIL DUAL ACTION) 125-250 MG TABS Take 2 tablets by mouth every 8 (eight) hours as needed (pain).    [provider]  Insulin Glargine (BASAGLAR KWIKPEN) 100 UNIT/ML Inject 18 Units into the skin at bedtime. 06/24/22   [provider]  Insulin Pen Needle 31G X 5 MM MISC by Does not apply route.    [provider]  irbesartan (AVAPRO) 300 MG tablet Take 1 tablet (300 mg total) by mouth daily. 04/26/22   Nyoka Cowden, MD  Lidocaine (SALONPAS PAIN RELIEVING) 4 % PTCH Apply 1 each topically every 12 (twelve) hours. 11/08/20   Kommor, Madison, MD  linaclotide (LINZESS) 290 MCG CAPS capsule TAKE 1 CAPSULE BY MOUTH ONCE DAILY BEFORE BREAKFAST Patient taking differently: Take 290 mcg by mouth daily before breakfast. 04/15/22   Gelene Mink, NP  meclizine (ANTIVERT) 25 MG tablet Take 25 mg by mouth 2 (two) times daily as needed for dizziness. 03/16/18   [provider]  metFORMIN (GLUCOPHAGE) 500 MG tablet Take 500 mg by mouth 2 (two) times daily with a meal.    [provider]  Metoprolol Tartrate 75 MG TABS Take 75 mg by mouth 3 (three) times daily. 08/30/17   [provider]  montelukast (SINGULAIR) 10 MG tablet Take 10 mg by mouth at bedtime.    [provider]  nitroGLYCERIN (NITROSTAT) 0.4 MG SL tablet Place 1 tablet (0.4 mg total) under the tongue every 5 (five) minutes x 3 doses as needed for chest pain. 04/27/18   Strader, Lennart Pall, PA-C  OVER THE COUNTER MEDICATION One touch Verio  Reflect glucose machine.  One touch Verio Reflect glucose test strips 1-2 daily  One touch delica plus Lancets one lancet to check glucose bid.    [provider]  pantoprazole (PROTONIX) 40 MG tablet Take 40 mg by mouth daily. 11/28/17   [provider]  promethazine (PHENERGAN) 25 MG tablet Take 25 mg by mouth. Two times per day. 12/02/17   [provider]  simvastatin (ZOCOR) 20 MG tablet Take 20 mg by mouth daily. 02/10/21   [provider]  sodium chloride (OCEAN) 0.65 % SOLN nasal spray Place 1 spray into both nostrils as needed for congestion.    [provider]  VASCEPA 1 g capsule Take 2 g by mouth 2 (two) times daily. 06/17/20   [provider]    Physical Exam: Vitals:   08/09/22 1250 08/09/22 1445 08/09/22 1500 08/09/22 1545  BP: Marland Kitchen)  128/52 (!) 131/57 (!) 114/56 (!) 142/63  Pulse: 79 81 77 80  Resp: (!) 22 (!) 22 (!) 21 (!) 21  Temp:      TempSrc:      SpO2: 94% 95% 95% 93%  Weight:      Height:        Constitutional:  calm, comfortable Vitals:   08/09/22 1250 08/09/22 1445 08/09/22 1500 08/09/22 1545  BP: (!) 128/52 (!) 131/57 (!) 114/56 (!) 142/63  Pulse: 79 81 77 80  Resp: (!) 22 (!) 22 (!) 21 (!) 21  Temp:      TempSrc:      SpO2: 94% 95% 95% 93%  Weight:      Height:       Eyes: PERRL, lids and conjunctivae normal ENMT: Mucous membranes are moist.   Neck: normal, supple, no masses, no thyromegaly Respiratory: clear to auscultation bilaterally, no wheezing, no crackles. Normal respiratory effort. No accessory muscle use.  Cardiovascular: Regular rate and rhythm, no murmurs / rubs / gallops. No extremity edema.  Extremities warm Abdomen: obese, no tenderness, no masses palpated. No hepatosplenomegaly. Bowel sounds positive.  Musculoskeletal: no clubbing / cyanosis. No joint deformity upper and lower extremities.  Skin: no rashes, lesions, ulcers. No induration Neurologic:  Neurological:     Mental  Status: She is alert.     GCS: GCS eye subscore is 4. GCS verbal subscore is 5. GCS motor subscore is 6.     Comments: Mental Status:  Alert, oriented, thought content appropriate, able to give a coherent history.  Speech slightly slurred, likely due to weakness to the left side of her face,  Cranial Nerves:  II:  Peripheral visual fields grossly normal, pupils equal, round, reactive to light III,IV, VI: ptosis not present, extra-ocular motions intact bilaterally  V,VII: Flattening of left nasolabial fold, subjective reduced sensation to the left side of her face, Sparing her forehead, she is able to move and wrinkle her forehead VIII: hearing grossly normal to voice  X:  XI:  Motor:  Normal tone.  4+/5 strength bilateral lower extremities, left lower extremity weaker than right in keep ing legs up against resistance.  Strong Grip strength bilaterally Sensory: Subjectively reduced sensation to left side of face, upper extremity and lower extremity Cerebellar:  CV: distal pulses palpable throughout  .  Psychiatric: Normal judgment and insight. Alert and oriented x 3. Normal mood.   Labs on Admission: I have personally reviewed following labs and imaging studies  CBC: Recent Labs  Lab 08/09/22 1212 08/09/22 1220  WBC 8.5  --   NEUTROABS 4.9  --   HGB 13.3 13.3  HCT 39.2 39.0  MCV 92.2  --   PLT 306  --    Basic Metabolic Panel: Recent Labs  Lab 08/09/22 1212 08/09/22 1220  NA 136 139  K 3.5 3.5  CL 102 102  CO2 23  --   GLUCOSE 194* 191*  BUN 17 16  CREATININE 1.00 1.00  CALCIUM 9.1  --    Liver Function Tests: Recent Labs  Lab 08/09/22 1212  AST 31  ALT 25  ALKPHOS 67  BILITOT 0.5  PROT 7.0  ALBUMIN 3.9   Coagulation Profile: Recent Labs  Lab 08/09/22 1212  INR 1.1   CBG: Recent Labs  Lab 08/09/22 1211  GLUCAP 184*    Urine analysis:    Component Value Date/Time   COLORURINE YELLOW 08/09/2022 1332   APPEARANCEUR CLEAR 08/09/2022 1332    LABSPEC 1.006  08/09/2022 1332   PHURINE 5.0 08/09/2022 1332   GLUCOSEU NEGATIVE 08/09/2022 1332   HGBUR SMALL (A) 08/09/2022 1332   BILIRUBINUR NEGATIVE 08/09/2022 1332   KETONESUR NEGATIVE 08/09/2022 1332   PROTEINUR NEGATIVE 08/09/2022 1332   UROBILINOGEN 0.2 03/29/2010 0931   NITRITE POSITIVE (A) 08/09/2022 1332   LEUKOCYTESUR SMALL (A) 08/09/2022 1332    Radiological Exams on Admission: MR BRAIN WO CONTRAST  Result Date: 08/09/2022 CLINICAL DATA:  Neuro deficit, acute, stroke suspected. Left facial droop. Numbness in the left face, left arm, and both legs. EXAM: MRI HEAD WITHOUT CONTRAST TECHNIQUE: Multiplanar, multiecho pulse sequences of the brain and surrounding structures were obtained without intravenous contrast. COMPARISON:  Head CT 08/09/2022 FINDINGS: Brain: There is no evidence of an acute infarct, intracranial hemorrhage, mass, midline shift, or extra-axial fluid collection. Small T2 hyperintensities in the cerebral white matter bilaterally are nonspecific but compatible with mild chronic small vessel ischemic disease. The ventricles and sulci are within normal limits for age. Vascular: Major intracranial vascular flow voids are preserved. Skull and upper cervical spine: No suspicious marrow lesion. Sinuses/Orbits: Unremarkable orbits. Paranasal sinuses and mastoid air cells are clear. Other: None. IMPRESSION: 1. No acute intracranial abnormality. 2. Mild chronic small vessel ischemic disease. Electronically Signed   By: Sebastian Ache M.D.   On: 08/09/2022 14:16   CT HEAD WO CONTRAST  Result Date: 08/09/2022 CLINICAL DATA:  Provided history: Neuro deficit, acute, stroke suspected. Additional history provided: Left-sided facial droop. EXAM: CT HEAD WITHOUT CONTRAST TECHNIQUE: Contiguous axial images were obtained from the base of the skull through the vertex without intravenous contrast. RADIATION DOSE REDUCTION: This exam was performed according to the departmental dose-optimization  program which includes automated exposure control, adjustment of the mA and/or kV according to patient size and/or use of iterative reconstruction technique. COMPARISON:  Head CT 03/31/2010. FINDINGS: Brain: There is no acute intracranial hemorrhage. No demarcated cortical infarct. No extra-axial fluid collection. No evidence of an intracranial mass. No midline shift. Vascular: No hyperdense vessel. Atherosclerotic calcifications. Skull: No fracture or aggressive osseous lesion. Sinuses/Orbits: No mass or acute finding within the imaged orbits. No significant paranasal sinus disease at the imaged levels. IMPRESSION: No evidence of an acute intracranial abnormality. Electronically Signed   By: Jackey Loge D.O.   On: 08/09/2022 13:27    EKG: Independently reviewed.  Sinus rhythm, rate 81, QTc 422 no significant surgical changes from prior.  Assessment/Plan Principal Problem:   Left sided numbness Active Problems:   Morbid obesity (HCC)   Essential hypertension   Bipolar 1 disorder (HCC)   Diabetes mellitus without complication (HCC)   Assessment and Plan: * Left sided numbness Left facial droop obvious on exam, with sparing of the forehead and slightly slurred speech, left-sided numbness-exam subjectively, she has numbness to her left face, left upper and lower extremity.  Good grip strength bilateral upper extremity, mildly reduced resistance against gravity to left lower extremity compared to right.  CT negative for acute abnormality, MRI negative for acute stroke. - EDP talked to neurology, recommended admission for clinical stroke symptoms, patient may have suffered a MRI negative stroke -Neurology consult inpatient -CTA head and neck pending - Lipid panel - Hgba1c -Aspirin 325 x 1 -Echocardiogram, - Swallow Eval - PT, OT, ST  -CTA neck done, deferred carotids -Allow for permissive hypertension -Change simvastatin to atorvastatin 40 mg daily -UA with positive nitrites and small  leukocytes, denies urinary symptoms, IV ceftriaxone given in ED, holding off on further antibiotics with lack  of symptoms, urine cultures were not obtained.  Diabetes mellitus without complication (HCC) - Resume Lantus at reduced dose 12 u nits at bedtime( Home dose 18u) - SSI- M -Hold metformin - HgbA1c  Bipolar 1 disorder (HCC) -   Essential hypertension Stable. -Allow for permissive hypertension, hold HCTZ 25 mg, irbesartan 300, metoprolol 75 -As needed labetalol for systolic greater than 200   DVT prophylaxis: Lovenox Code Status: FULL Code-confirmed with patient at bedside Family Communication: None at bedside Disposition Plan: ~ 2 days Consults called: Neurology Admission status: Obs tele    Author: Onnie Boer, MD 08/09/2022 6:52 PM  For on call review [email or web address].

## 2022-08-09 NOTE — Assessment & Plan Note (Addendum)
Left facial droop obvious on exam, with sparing of the forehead and slightly slurred speech, left-sided numbness-exam subjectively, she has numbness to her left face, left upper and lower extremity.  Good grip strength bilateral upper extremity, mildly reduced resistance against gravity to left lower extremity compared to right.  CT negative for acute abnormality, MRI negative for acute stroke. - EDP talked to neurology, recommended admission for clinical stroke symptoms, patient may have suffered a MRI negative stroke -Neurology consult inpatient -CTA head and neck pending - Lipid panel - Hgba1c -Aspirin 325 x 1 -Echocardiogram, - Swallow Eval - PT, OT, ST  -CTA neck done, deferred carotids -Allow for permissive hypertension -Change simvastatin to atorvastatin 40 mg daily -UA with positive nitrites and small leukocytes, denies urinary symptoms, IV ceftriaxone given in ED, holding off on further antibiotics with lack of symptoms, urine cultures were not obtained.

## 2022-08-09 NOTE — ED Provider Notes (Signed)
[facility] AT [facility] Provider Note   CSN: [id number] Arrival date & time: 08/09/22  1144     History  Chief Complaint  Patient presents with   Facial Droop    [name] is a 72 y.o. female.  HPI Patient presents for strokelike symptoms.  Medical history includes HTN, GERD, CVA, bipolar disorder.  She states that she has had a remote stroke approximately 30 years ago.  She denies any residual deficits from that prior stroke.  Currently she is on 81 mg aspirin but not on any other anticoagulation or antiplatelet meds.  3 days ago, she had onset of left facial numbness and weakness.  She also describes left arm numbness and bilateral leg numbness.  Symptoms have been persistent for the past 3 days.      Home Medications Prior to Admission medications   Medication Sig Start Date End Date Taking? Authorizing Provider  albuterol (2.5 MG/3ML) 0.083% NEBU 3 mL, albuterol (5 MG/ML) 0.5% NEBU 0.5 mL Inhale into the lungs every 12 (twelve) hours. As needed    [provider]  albuterol (PROVENTIL HFA;VENTOLIN HFA) 108 (90 Base) MCG/ACT inhaler Inhale 2 puffs into the lungs every 8 (eight) hours as needed for shortness of breath or wheezing. 12/13/17   [provider]  aspirin EC 81 MG tablet Take 81 mg by mouth daily.    [provider]  BYDUREON 2 MG PEN Inject 2 mg as directed. Every Sunday. 10/18/17   [provider]  cyclobenzaprine (FLEXERIL) 10 MG tablet Take 1 tablet (10 mg total) by mouth 2 (two) times daily as needed for muscle spasms. 11/08/20   Kommor, Madison, MD  dicyclomine (BENTYL) 10 MG capsule Take 1 capsule (10 mg total) by mouth 3 (three) times daily as needed (abdominal cramping or diarrhea. Hold in setting of constipation.). 12/15/21   Letta Median, PA-C  famotidine (PEPCID) 20 MG tablet One after supper Patient taking differently: Take 20 mg by mouth daily. 04/26/22   Nyoka Cowden, MD   fluticasone (FLONASE) 50 MCG/ACT nasal spray Place 2 sprays into both nostrils daily.    [provider]  gabapentin (NEURONTIN) 300 MG capsule Take 300 mg by mouth 3 (three) times daily. 07/05/22   [provider]  hydrochlorothiazide (HYDRODIURIL) 25 MG tablet Take 25 mg by mouth daily. 11/06/17   [provider]  Ibuprofen-Acetaminophen (ADVIL DUAL ACTION) 125-250 MG TABS Take 2 tablets by mouth every 8 (eight) hours as needed (pain).    [provider]  Insulin Glargine (BASAGLAR KWIKPEN) 100 UNIT/ML Inject 18 Units into the skin at bedtime. 06/24/22   [provider]  Insulin Pen Needle 31G X 5 MM MISC by Does not apply route.    [provider]  irbesartan (AVAPRO) 300 MG tablet Take 1 tablet (300 mg total) by mouth daily. 04/26/22   Nyoka Cowden, MD  Lidocaine (SALONPAS PAIN RELIEVING) 4 % PTCH Apply 1 each topically every 12 (twelve) hours. 11/08/20   Kommor, Madison, MD  linaclotide (LINZESS) 290 MCG CAPS capsule TAKE 1 CAPSULE BY MOUTH ONCE DAILY BEFORE BREAKFAST Patient taking differently: Take 290 mcg by mouth daily before breakfast. 04/15/22   Gelene Mink, NP  meclizine (ANTIVERT) 25 MG tablet Take 25 mg by mouth 2 (two) times daily as needed for dizziness. 03/16/18   [provider]  metFORMIN (GLUCOPHAGE) 500 MG tablet Take 500 mg by mouth 2 (two) times daily with a meal.  [provider]  Metoprolol Tartrate 75 MG TABS Take 75 mg by mouth 3 (three) times daily. 08/30/17   [provider]  montelukast (SINGULAIR) 10 MG tablet Take 10 mg by mouth at bedtime.    [provider]  nitroGLYCERIN (NITROSTAT) 0.4 MG SL tablet Place 1 tablet (0.4 mg total) under the tongue every 5 (five) minutes x 3 doses as needed for chest pain. 04/27/18   Strader, Lennart Pall, PA-C  OVER THE COUNTER MEDICATION One touch Verio Reflect glucose machine.  One touch Verio Reflect glucose test strips 1-2 daily  One touch  delica plus Lancets one lancet to check glucose bid.    [provider]  pantoprazole (PROTONIX) 40 MG tablet Take 40 mg by mouth daily. 11/28/17   [provider]  promethazine (PHENERGAN) 25 MG tablet Take 25 mg by mouth. Two times per day. 12/02/17   [provider]  simvastatin (ZOCOR) 20 MG tablet Take 20 mg by mouth daily. 02/10/21   [provider]  sodium chloride (OCEAN) 0.65 % SOLN nasal spray Place 1 spray into both nostrils as needed for congestion.    [provider]  VASCEPA 1 g capsule Take 2 g by mouth 2 (two) times daily. 06/17/20   [provider]      Allergies    Ciprofloxacin, Latex, and Sulfa antibiotics    Review of Systems   Review of Systems  Neurological:  Positive for facial asymmetry, weakness and numbness.  All other systems reviewed and are negative.   Physical Exam Updated Vital Signs BP (!) 117/93   Pulse 79   Temp 97.6 F (36.4 C) (Oral)   Resp 18   Ht 5\' 3"  (1.6 m)   Wt 121.1 kg   SpO2 92%   BMI 47.28 kg/m  Physical Exam Vitals and nursing note reviewed.  Constitutional:      General: She is not in acute distress.    Appearance: Normal appearance. She is well-developed. She is not toxic-appearing or diaphoretic.  HENT:     Head: Normocephalic and atraumatic.     Right Ear: External ear normal.     Left Ear: External ear normal.     Nose: Nose normal.     Mouth/Throat:     Mouth: Mucous membranes are moist.  Eyes:     Conjunctiva/sclera: Conjunctivae normal.  Cardiovascular:     Rate and Rhythm: Normal rate and regular rhythm.     Heart sounds: No murmur heard. Pulmonary:     Effort: Pulmonary effort is normal. No respiratory distress.  Abdominal:     General: There is no distension.     Tenderness: There is no abdominal tenderness.  Musculoskeletal:        General: No swelling. Normal range of motion.     Cervical back: Normal range of motion and neck supple.  Skin:    General:  Skin is warm and dry.     Coloration: Skin is not jaundiced or pale.  Neurological:     Mental Status: She is alert.     Cranial Nerves: Facial asymmetry present.     Sensory: Sensory deficit present.     Motor: Weakness and pronator drift present.     Coordination: Coordination is intact. Finger-Nose-Finger Test normal.  Psychiatric:        Mood and Affect: Mood normal.        Behavior: Behavior normal.     ED Results / Procedures / Treatments   Labs (  all labs ordered are listed, but only abnormal results are displayed) Labs Reviewed  APTT - Abnormal; Notable for the following components:      Result Value   aPTT 23 (*)    All other components within normal limits  COMPREHENSIVE METABOLIC PANEL - Abnormal; Notable for the following components:   Glucose, Bld 194 (*)    All other components within normal limits  URINALYSIS, ROUTINE W REFLEX MICROSCOPIC - Abnormal; Notable for the following components:   Hgb urine dipstick SMALL (*)    Nitrite POSITIVE (*)    Leukocytes,Ua SMALL (*)    Bacteria, UA FEW (*)    All other components within normal limits  I-STAT CHEM 8, ED - Abnormal; Notable for the following components:   Glucose, Bld 191 (*)    All other components within normal limits  CBG MONITORING, ED - Abnormal; Notable for the following components:   Glucose-Capillary 184 (*)    All other components within normal limits  PROTIME-INR  CBC  DIFFERENTIAL  ETHANOL  RAPID URINE DRUG SCREEN, HOSP PERFORMED    EKG EKG Interpretation  Date/Time:  Monday August 09 2022 11:55:51 EDT Ventricular Rate:  81 PR Interval:  171 QRS Duration: 89 QT Interval:  363 QTC Calculation: 422 R Axis:   15 Text Interpretation: Sinus rhythm Low voltage, precordial leads Confirmed by Gloris Manchester ([id number]) on 08/09/2022 2:09:30 PM  Radiology MR BRAIN WO CONTRAST  Result Date: 08/09/2022 CLINICAL DATA:  Neuro deficit, acute, stroke suspected. Left facial droop. Numbness in the left face,  left arm, and both legs. EXAM: MRI HEAD WITHOUT CONTRAST TECHNIQUE: Multiplanar, multiecho pulse sequences of the brain and surrounding structures were obtained without intravenous contrast. COMPARISON:  Head CT 08/09/2022 FINDINGS: Brain: There is no evidence of an acute infarct, intracranial hemorrhage, mass, midline shift, or extra-axial fluid collection. Small T2 hyperintensities in the cerebral white matter bilaterally are nonspecific but compatible with mild chronic small vessel ischemic disease. The ventricles and sulci are within normal limits for age. Vascular: Major intracranial vascular flow voids are preserved. Skull and upper cervical spine: No suspicious marrow lesion. Sinuses/Orbits: Unremarkable orbits. Paranasal sinuses and mastoid air cells are clear. Other: None. IMPRESSION: 1. No acute intracranial abnormality. 2. Mild chronic small vessel ischemic disease. Electronically Signed   By: Sebastian Ache M.D.   On: 08/09/2022 14:16   CT HEAD WO CONTRAST  Result Date: 08/09/2022 CLINICAL DATA:  Provided history: Neuro deficit, acute, stroke suspected. Additional history provided: Left-sided facial droop. EXAM: CT HEAD WITHOUT CONTRAST TECHNIQUE: Contiguous axial images were obtained from the base of the skull through the vertex without intravenous contrast. RADIATION DOSE REDUCTION: This exam was performed according to the departmental dose-optimization program which includes automated exposure control, adjustment of the mA and/or kV according to patient size and/or use of iterative reconstruction technique. COMPARISON:  Head CT 03/31/2010. FINDINGS: Brain: There is no acute intracranial hemorrhage. No demarcated cortical infarct. No extra-axial fluid collection. No evidence of an intracranial mass. No midline shift. Vascular: No hyperdense vessel. Atherosclerotic calcifications. Skull: No fracture or aggressive osseous lesion. Sinuses/Orbits: No mass or acute finding within the imaged orbits. No  significant paranasal sinus disease at the imaged levels. IMPRESSION: No evidence of an acute intracranial abnormality. Electronically Signed   By: Jackey Loge D.O.   On: 08/09/2022 13:27    Procedures Procedures    Medications Ordered in ED Medications  iohexol (OMNIPAQUE) 350 MG/ML injection 80 mL (has no administration in time range)  cefTRIAXone (  ROCEPHIN) 1 g in sodium chloride 0.9 % 100 mL IVPB (1 g Intravenous New Bag/Given 08/09/22 1637)    ED Course/ Medical Decision Making/ A&P                             Medical Decision Making Amount and/or Complexity of Data Reviewed Labs: ordered. Radiology: ordered.  Risk Decision regarding hospitalization.   This patient presents to the ED for concern of strokelike symptoms, this involves an extensive number of treatment options, and is a complaint that carries with it a high risk of complications and morbidity.  The differential diagnosis includes CVA, metabolic derangements, infection, dissection   Co morbidities that complicate the patient evaluation  HTN, GERD, CVA, bipolar disorder   Additional history obtained:  Additional history obtained from N/A External records from outside source obtained and reviewed including EMR   Lab Tests:  I Ordered, and personally interpreted labs.  The pertinent results include: Unremarkable serum lab work.  Urinalysis consistent with UTI.   Imaging Studies ordered:  I ordered imaging studies including CT head, MRI brain, CTA head and neck I independently visualized and interpreted imaging which showed no acute findings on Noncon CT or MRI.  CTA pending at time of admission. I agree with the radiologist interpretation   Cardiac Monitoring: / EKG:  The patient was maintained on a cardiac monitor.  I personally viewed and interpreted the cardiac monitored which showed an underlying rhythm of: Sinus rhythm   Consultations Obtained:  I requested consultation with the  neurologist, Dr. Selina Cooley,  and discussed lab and imaging findings as well as pertinent plan - they recommend: Admission for stroke workup   Problem List / ED Course / Critical interventions / Medication management  Patient presents for 3 days of left-sided facial weakness and numbness in addition to some left hemibody numbness.  She reports a remote stroke of approximately 30 years ago with no residual deficits in the past.  She is currently on a baby aspirin but no other anticoagulation or antiplatelet medicines.  On arrival in the ED, she has a clear left-sided facial droop with sparing of the forehead.  Further neuroexam findings are mild left arm pronator drift and diminished sensation in left hemibody.  Given the timeline, no code stroke was initiated.  CT and MRI studies were ordered.  Lab work was initiated.  No acute findings were identified on noncontrasted CT scan of head.  Interestingly, MRI was also negative for acute findings.  This raises concern for possible dissection.  CTA of head and neck were ordered.  I discussed with neurologist on-call, Dr. Selina Cooley, who does recommend admission for clinical stroke symptoms.  Patient may have suffered a MRI negative stroke.  Although serum lab work was unremarkable, urinalysis did show evidence of UTI.  Ceftriaxone was ordered.  Patient was admitted to medicine for further management. I ordered medication including ceftriaxone for UTI Reevaluation of the patient after these medicines showed that the patient stayed the same I have reviewed the patients home medicines and have made adjustments as needed   Social Determinants of Health:  Has PCP        Final Clinical Impression(s) / ED Diagnoses Final diagnoses:  Stroke-like symptoms    Rx / DC Orders ED Discharge Orders     None         Gloris Manchester, MD 08/09/22 [id number]

## 2022-08-09 NOTE — ED Notes (Signed)
Patient transported to MRI 

## 2022-08-10 ENCOUNTER — Other Ambulatory Visit (HOSPITAL_COMMUNITY): Payer: Self-pay | Admitting: *Deleted

## 2022-08-10 ENCOUNTER — Observation Stay (HOSPITAL_BASED_OUTPATIENT_CLINIC_OR_DEPARTMENT_OTHER): Payer: Medicare HMO

## 2022-08-10 DIAGNOSIS — I639 Cerebral infarction, unspecified: Secondary | ICD-10-CM | POA: Diagnosis not present

## 2022-08-10 DIAGNOSIS — R200 Anesthesia of skin: Secondary | ICD-10-CM | POA: Diagnosis not present

## 2022-08-10 DIAGNOSIS — I6389 Other cerebral infarction: Secondary | ICD-10-CM | POA: Diagnosis not present

## 2022-08-10 DIAGNOSIS — E119 Type 2 diabetes mellitus without complications: Secondary | ICD-10-CM | POA: Diagnosis not present

## 2022-08-10 LAB — HEMOGLOBIN A1C
Hgb A1c MFr Bld: 6.4 % — ABNORMAL HIGH (ref 4.8–5.6)
Mean Plasma Glucose: 136.98 mg/dL

## 2022-08-10 LAB — ECHOCARDIOGRAM COMPLETE
AR max vel: 2.02 cm2
AV Area VTI: 1.94 cm2
AV Area mean vel: 2.36 cm2
AV Mean grad: 4 mmHg
AV Peak grad: 8 mmHg
Ao pk vel: 1.41 m/s
Area-P 1/2: 2.8 cm2
Height: 63 in
S' Lateral: 2.4 cm
Weight: 4172.87 oz

## 2022-08-10 LAB — GLUCOSE, CAPILLARY
Glucose-Capillary: 156 mg/dL — ABNORMAL HIGH (ref 70–99)
Glucose-Capillary: 173 mg/dL — ABNORMAL HIGH (ref 70–99)
Glucose-Capillary: 124 mg/dL — ABNORMAL HIGH (ref 70–99)

## 2022-08-10 LAB — LIPID PANEL
Cholesterol: 131 mg/dL (ref 0–200)
HDL: 41 mg/dL (ref >40)
LDL Cholesterol: 59 mg/dL (ref 0–99)
Total CHOL/HDL Ratio: 3.2 RATIO
Triglycerides: 157 mg/dL — ABNORMAL HIGH (ref <150)
VLDL: 31 mg/dL (ref 0–40)

## 2022-08-10 LAB — HIV ANTIBODY (ROUTINE TESTING W REFLEX): HIV Screen 4th Generation wRfx: NONREACTIVE

## 2022-08-10 MED ORDER — ASPIRIN EC 81 MG PO TBEC: 81.0000 mg | DELAYED_RELEASE_TABLET | Freq: Every day | ORAL | 0 refills | Status: DC

## 2022-08-10 MED ORDER — ASPIRIN 81 MG PO TBEC: 81.0000 mg | DELAYED_RELEASE_TABLET | Freq: Every day | ORAL | Status: DC

## 2022-08-10 MED ORDER — ALBUTEROL SULFATE HFA 108 (90 BASE) MCG/ACT IN AERS: 2.0000 | INHALATION_SPRAY | Freq: Three times a day (TID) | RESPIRATORY_TRACT | 3 refills | Status: AC | PRN

## 2022-08-10 MED ORDER — CLOPIDOGREL BISULFATE 75 MG PO TABS
75.0000 mg | ORAL_TABLET | Freq: Every day | ORAL | Status: DC
  Administered 2022-08-10: 75 mg via ORAL
  Filled 2022-08-10: qty 1

## 2022-08-10 MED ORDER — CLOPIDOGREL BISULFATE 75 MG PO TABS: 75.0000 mg | ORAL_TABLET | Freq: Every day | ORAL | 11 refills | Status: DC

## 2022-08-10 MED ORDER — ATORVASTATIN CALCIUM 40 MG PO TABS: 40.0000 mg | ORAL_TABLET | Freq: Every day | ORAL | 11 refills | Status: AC

## 2022-08-10 MED ORDER — POTASSIUM CHLORIDE ER 10 MEQ PO TBCR: 10.0000 meq | EXTENDED_RELEASE_TABLET | Freq: Every day | ORAL | 0 refills | Status: DC

## 2022-08-10 MED ORDER — HYDROCHLOROTHIAZIDE 25 MG PO TABS: 12.5000 mg | ORAL_TABLET | Freq: Every day | ORAL | 1 refills | Status: DC

## 2022-08-10 MED ORDER — ALBUTEROL SULFATE (2.5 MG/3ML) 0.083% IN NEBU: 2.5000 mg | INHALATION_SOLUTION | RESPIRATORY_TRACT | 2 refills | Status: DC | PRN

## 2022-08-10 NOTE — Evaluation (Addendum)
Occupational Therapy Evaluation Patient Details Name: [name] MRN: [id number] DOB: 19-Mar-1950 Today's Date: 08/10/2022   History of Present Illness [name] is a 72 y.o. female with medical history significant for hypertension, diabetes, bipolar disorder, stroke, bipolar disorder.  Patient presented to ED with left facial droop for the past 3 days, numbness to the left side of her face, with numbness to upper and lower extremities.  She also reports weakness to her left leg, feeling like her leg is heavy.  She reports she has had difficulty grasping objects with her left hand-she is not sure if this is from numbness or weakness.  She reports change in speech over the past 3 days because she is unable to move the left side of her mouth. At baseline she ambulates with a cane. (per MD)   Clinical Impression   Pt agreeable to OT and PT co-evaluation. Pt is mostly independent at baseline with use of cane primarily.  Pt appears to be near baseline function with reports of frequent falls on her steps. Today pt required supervision to mod I level for assist for ambulation. Pt completes seated ADL's well without assist. Pt used RW at times due to reports of dizziness. Pt is not recommended for further acute OT services and will be discharged to care of the nursing staff for the remaining length of stay. Home Health OT recommended for safety assessment due to reports of frequent falls.      Recommendations for follow up therapy are one component of a multi-disciplinary discharge planning process, led by the attending physician.  Recommendations may be updated based on patient status, additional functional criteria and insurance authorization.   Assistance Recommended at Discharge PRN  Patient can return home with the following Help with stairs or ramp for entrance        Equipment Recommendations  None recommended by OT           Precautions / Restrictions Precautions Precautions:  Fall Restrictions Weight Bearing Restrictions: No      Mobility Bed Mobility Overal bed mobility: Modified Independent             General bed mobility comments: Use of bed rail.    Transfers Overall transfer level: Needs assistance Equipment used: Straight cane Transfers: Sit to/from Stand, Bed to chair/wheelchair/BSC Sit to Stand: Modified independent (Device/Increase time) Stand pivot transfers: Modified independent (Device/Increase time)                Balance Overall balance assessment: Needs assistance Sitting-balance support: No upper extremity supported, Feet supported Sitting balance-Leahy Scale: Good Sitting balance - Comments: seated at EOB   Standing balance support: Single extremity supported, During functional activity Standing balance-Leahy Scale: Fair Standing balance comment: using cane                           ADL either performed or assessed with clinical judgement   ADL Overall ADL's : Needs assistance/impaired     Grooming: Modified independent;Supervision/safety;Standing;Wash/dry hands Grooming Details (indicate cue type and reason): PT able to wash her hands at the sink without assist.     Lower Body Bathing: Modified independent;Sitting/lateral leans       Lower Body Dressing: Modified independent;Sitting/lateral leans Lower Body Dressing Details (indicate cue type and reason): Able to don shoes seated at EOB. Toilet Transfer: Supervision/safety;Modified Independent;Ambulation (cane) Toilet Transfer Details (indicate cue type and reason): Pt able to ambulate to the toilet from the  chair and back. Toileting- Clothing Manipulation and Hygiene: Modified independent;Independent;Sit to/from stand Toileting - Clothing Manipulation Details (indicate cue type and reason): Pt completed peri-care without assist.     Functional mobility during ADLs: Supervision/safety;Rolling walker (2 wheels)       Vision Baseline  Vision/History: 1 Wears glasses Ability to See in Adequate Light: 1 Impaired Patient Visual Report: Blurring of vision (Has since resolved.) Vision Assessment?: No apparent visual deficits Additional Comments: Outside of baseline issues. Mild L lateral strabismus; likely at baseline.     Perception     Praxis      Pertinent Vitals/Pain Pain Assessment Pain Assessment: 0-10 Pain Score: 7  Pain Location: chest and abdomen Pain Descriptors / Indicators: Aching Pain Intervention(s): Limited activity within patient's tolerance, Monitored during session, Repositioned     Hand Dominance Right   Extremity/Trunk Assessment Upper Extremity Assessment Upper Extremity Assessment: Generalized weakness; Mild L elbow extension weakness but not very significant.    Lower Extremity Assessment Lower Extremity Assessment: Defer to PT evaluation   Cervical / Trunk Assessment Cervical / Trunk Assessment: Normal   Communication Communication Communication: HOH   Cognition Arousal/Alertness: Awake/alert Behavior During Therapy: WFL for tasks assessed/performed Overall Cognitive Status: Within Functional Limits for tasks assessed                                                        Home Living Family/patient expects to be discharged to:: Private residence Living Arrangements: Other relatives (granddaughter) Available Help at Discharge: Family;Available 24 hours/day Type of Home: Other(Comment) ("double wide") Home Access: Stairs to enter [employer] of Steps: 5 to 6 Entrance Stairs-Rails: Right;Left Home Layout: One level     Bathroom Shower/Tub: Chief Strategy Officer: Standard (Places BSC over toilet.) Bathroom Accessibility: No   Home Equipment: Agricultural consultant (2 wheels);Cane - single point;BSC/3in1;Tub bench          Prior Functioning/Environment Prior Level of Function : Independent/Modified Independent;History of Falls (last  six months)             Mobility Comments: [place] and short distance community ambulator with SPC mostly; RW used if pt is having a rough day; uses scooter in grocery store. ADLs Comments: Independent ADL; has not done much cleaning, but does cook. Pt drives.                                Co-evaluation PT/OT/SLP Co-Evaluation/Treatment: Yes Reason for Co-Treatment: To address functional/ADL transfers   OT goals addressed during session: ADL's and self-care      AM-PAC OT "6 Clicks" Daily Activity     Outcome Measure Help from another person eating meals?: None Help from another person taking care of personal grooming?: None Help from another person toileting, which includes using toliet, bedpan, or urinal?: None Help from another person bathing (including washing, rinsing, drying)?: None Help from another person to put on and taking off regular upper body clothing?: None Help from another person to put on and taking off regular lower body clothing?: None 6 Click Score: 24   End of Session Equipment Utilized During Treatment: Rolling walker (2 wheels);Other (comment) (cane)  Activity Tolerance: Patient tolerated treatment well Patient left: in chair;with call bell/phone within reach  OT Visit Diagnosis:  Unsteadiness on feet (R26.81);Other abnormalities of gait and mobility (R26.89);Muscle weakness (generalized) (M62.81);Other symptoms and signs involving the nervous system (R29.898)                Time: 1610-9604 OT Time Calculation (min): 27 min Charges:  OT General Charges $OT Visit: 1 Visit OT Evaluation $OT Eval Low Complexity: 1 Low  Myrel Rappleye OT, MOT   Danie Chandler 08/10/2022, 9:35 AM

## 2022-08-10 NOTE — Plan of Care (Signed)
  Problem: Acute Rehab PT Goals(only PT should resolve) Goal: Pt Will Go Supine/Side To Sit Outcome: Progressing Flowsheets (Taken 08/10/2022 1219) Pt will go Supine/Side to Sit: Independently Goal: Patient Will Transfer Sit To/From Stand Outcome: Progressing Flowsheets (Taken 08/10/2022 1219) Patient will transfer sit to/from stand: with modified independence Goal: Pt Will Transfer Bed To Chair/Chair To Bed Outcome: Progressing Flowsheets (Taken 08/10/2022 1219) Pt will Transfer Bed to Chair/Chair to Bed: with modified independence Goal: Pt Will Ambulate Outcome: Progressing Flowsheets (Taken 08/10/2022 1219) Pt will Ambulate:  100 feet  with modified independence  with rolling walker   12:19 PM, 08/10/22 Ocie Bob, MPT Physical Therapist with [facility] [phone] office [phone] mobile phone

## 2022-08-10 NOTE — Progress Notes (Signed)
Patient rested some through the night, PRN tylenol given for complaints of pain.

## 2022-08-10 NOTE — Evaluation (Signed)
Physical Therapy Evaluation Patient Details Name: [name] MRN: [id number] DOB: 10-Aug-1950 Today's Date: 08/10/2022  History of Present Illness  [name] is a 72 y.o. female with medical history significant for hypertension, diabetes, bipolar disorder, stroke, bipolar disorder.  Patient presented to ED with left facial droop for the past 3 days, numbness to the left side of her face, with numbness to upper and lower extremities.  She also reports weakness to her left leg, feeling like her leg is heavy.  She reports she has had difficulty grasping objects with her left hand-she is not sure if this is from numbness or weakness.  She reports change in speech over the past 3 days because she is unable to move the left side of her mouth. At baseline she ambulates with a cane.   Clinical Impression  Patient demonstrates good return for sitting up at bedside, transferring to/from chair and commode in bathroom, ambulating short distances using SPC, but required use of RW for longer distances due to c/o fatigue.  Patient able to go up/down steps without loss of balance using SPC and on side rail and tolerated sitting up in chair after therapy.  Patient follow with w/c for safety due to c/o dizziness during gait training.  Patient tolerated sitting up in chair after therapy.  Patient will benefit from continued skilled physical therapy in hospital and recommended venue below to increase strength, balance, endurance for safe ADLs and gait.         Recommendations for follow up therapy are one component of a multi-disciplinary discharge planning process, led by the attending physician.  Recommendations may be updated based on patient status, additional functional criteria and insurance authorization.  Follow Up Recommendations       Assistance Recommended at Discharge PRN  Patient can return home with the following  Help with stairs or ramp for entrance;Assistance with cooking/housework;A  little help with walking and/or transfers;A little help with bathing/dressing/bathroom    Equipment Recommendations None recommended by PT  Recommendations for Other Services       Functional Status Assessment Patient has had a recent decline in their functional status and demonstrates the ability to make significant improvements in function in a reasonable and predictable amount of time.     Precautions / Restrictions Precautions Precautions: Fall Restrictions Weight Bearing Restrictions: No      Mobility  Bed Mobility Overal bed mobility: Modified Independent             General bed mobility comments: Use of bed rail.    Transfers Overall transfer level: Needs assistance Equipment used: Straight cane Transfers: Sit to/from Stand, Bed to chair/wheelchair/BSC Sit to Stand: Modified independent (Device/Increase time) Stand pivot transfers: Modified independent (Device/Increase time)         General transfer comment: demonstrates good return for transferring to/from chair and commode in bathroom    Ambulation/Gait Ambulation/Gait assistance: Supervision Gait Distance (Feet): 75 Feet Assistive device: Rolling walker (2 wheels), Straight cane Gait Pattern/deviations: Decreased step length - right, Decreased step length - left, Decreased stride length, Step-to pattern Gait velocity: decreased     General Gait Details: slow labored cadence using SPC with mostly step-to pattern without loss of balance, patient preferred to use RW for longer distances due to fatigue with good return for use demonstrated  Stairs Stairs: Yes Stairs assistance: Supervision, Min guard Stair Management: One rail Left, One rail Right, Step to pattern Number of Stairs: 3 General stair comments: demonstrates fair/good return for  going up/down steps using 1 side rail and SPC without loss of balance  Wheelchair Mobility    Modified Rankin (Stroke Patients Only)       Balance Overall  balance assessment: Needs assistance Sitting-balance support: Feet supported, No upper extremity supported Sitting balance-Leahy Scale: Good Sitting balance - Comments: seated at EOB   Standing balance support: During functional activity, Single extremity supported Standing balance-Leahy Scale: Fair Standing balance comment: fair using SPC, fair/good using RW                             Pertinent Vitals/Pain Pain Assessment Pain Assessment: 0-10 Pain Score: 7  Pain Location: chest and abdomen Pain Descriptors / Indicators: Aching Pain Intervention(s): Limited activity within patient's tolerance, Monitored during session, Repositioned    Home Living Family/patient expects to be discharged to:: Private residence Living Arrangements: Other relatives Available Help at Discharge: Family;Available 24 hours/day Type of Home: Other(Comment) Home Access: Stairs to enter Entrance Stairs-Rails: Right;Left Entrance Stairs-Number of Steps: 5 to 6   Home Layout: One level Home Equipment: Agricultural consultant (2 wheels);Cane - single point;BSC/3in1;Tub bench      Prior Function Prior Level of Function : Independent/Modified Independent;History of Falls (last six months)             Mobility Comments: [place] and short distance community ambulator with SPC; RW used if pt is having a rought day; uses scooter in grocery store. ADLs Comments: Independent ADL; has not done much cleaning, but does cook. Pt drives.     Hand Dominance   Dominant Hand: Right    Extremity/Trunk Assessment   Upper Extremity Assessment Upper Extremity Assessment: Defer to OT evaluation    Lower Extremity Assessment Lower Extremity Assessment: Generalized weakness    Cervical / Trunk Assessment Cervical / Trunk Assessment: Normal  Communication   Communication: HOH  Cognition Arousal/Alertness: Awake/alert Behavior During Therapy: WFL for tasks assessed/performed Overall Cognitive Status:  Within Functional Limits for tasks assessed                                          General Comments      Exercises     Assessment/Plan    PT Assessment Patient needs continued PT services  PT Problem List Decreased strength;Decreased activity tolerance;Decreased balance;Decreased mobility       PT Treatment Interventions DME instruction;Gait training;Stair training;Functional mobility training;Therapeutic activities;Therapeutic exercise;Patient/family education;Balance training    PT Goals (Current goals can be found in the Care Plan section)  Acute Rehab PT Goals Patient Stated Goal: return home with family/friends to assist PT Goal Formulation: With patient Time For Goal Achievement: 08/12/22 Potential to Achieve Goals: Good    Frequency Min 3X/week     Co-evaluation PT/OT/SLP Co-Evaluation/Treatment: Yes Reason for Co-Treatment: To address functional/ADL transfers PT goals addressed during session: Mobility/safety with mobility;Balance;Proper use of DME OT goals addressed during session: ADL's and self-care       AM-PAC PT "6 Clicks" Mobility  Outcome Measure Help needed turning from your back to your side while in a flat bed without using bedrails?: None Help needed moving from lying on your back to sitting on the side of a flat bed without using bedrails?: None Help needed moving to and from a bed to a chair (including a wheelchair)?: None Help needed standing up from a chair using your  arms (e.g., wheelchair or bedside chair)?: None Help needed to walk in hospital room?: A Little Help needed climbing 3-5 steps with a railing? : A Little 6 Click Score: 22    End of Session   Activity Tolerance: Patient tolerated treatment well;Patient limited by fatigue Patient left: in chair;with call bell/phone within reach Nurse Communication: Mobility status PT Visit Diagnosis: Unsteadiness on feet (R26.81);Other abnormalities of gait and mobility  (R26.89);Muscle weakness (generalized) (M62.81)    Time: 1610-9604 PT Time Calculation (min) (ACUTE ONLY): 25 min   Charges:   PT Evaluation $PT Eval Moderate Complexity: 1 Mod PT Treatments $Therapeutic Activity: 23-37 mins        12:18 PM, 08/10/22 Ocie Bob, MPT Physical Therapist with [facility] 336 [phone] office [phone] mobile phone

## 2022-08-10 NOTE — Progress Notes (Signed)
*  PRELIMINARY RESULTS* Echocardiogram 2D Echocardiogram has been performed. Patient accidentally pulled I.V. out.  Stacey Drain 08/10/2022, 4:15 PM

## 2022-08-10 NOTE — Discharge Instructions (Signed)
1)Please take Aspirin 81 mg daily along with Plavix 75 mg daily for 21 days then after that STOP the Aspirin and  continue ONLY Plavix 75 mg daily indefinitely--for  stroke Prevention   2)Avoid ibuprofen/Advil/Aleve/Motrin/Goody Powders/Naproxen/BC powders/Meloxicam/Diclofenac/Indomethacin and other Nonsteroidal anti-inflammatory medications as these will make you more likely to bleed and can cause stomach ulcers, can also cause Kidney problems.   3)follow up with neurologist Dr. Pearlean Brownie in about 4 weeks as advised

## 2022-08-10 NOTE — Discharge Summary (Signed)
[name], is a 72 y.o. female  DOB April 19, 1950  MRN [id number].  Admission date:  08/09/2022  Admitting Physician  Onnie Boer, MD  Discharge Date:  08/10/2022   Primary MD  Pllc, [facility]  Recommendations for primary care physician for things to follow:   1)Please take Aspirin 81 mg daily along with Plavix 75 mg daily for 21 days then after that STOP the Aspirin and  continue ONLY Plavix 75 mg daily indefinitely--for  stroke Prevention   2)Avoid ibuprofen/Advil/Aleve/Motrin/Goody Powders/Naproxen/BC powders/Meloxicam/Diclofenac/Indomethacin and other Nonsteroidal anti-inflammatory medications as these will make you more likely to bleed and can cause stomach ulcers, can also cause Kidney problems.   3)follow up with neurologist Dr. Pearlean Brownie in about 4 weeks as advised  Admission Diagnosis  Left sided numbness [R20.0] Stroke-like symptoms [R29.90]   Discharge Diagnosis  Left sided numbness [R20.0] Stroke-like symptoms [R29.90]    Principal Problem:   Left sided numbness Active Problems:   Morbid obesity (HCC)   Essential hypertension   Bipolar 1 disorder (HCC)   Diabetes mellitus without complication (HCC)      Past Medical History:  Diagnosis Date   Bipolar 1 disorder (HCC)    Cancer (HCC)    Diabetes mellitus without complication (HCC)    Hypertension    PONV (postoperative nausea and vomiting)    Stroke [facility])     Past Surgical History:  Procedure Laterality Date   ABDOMINAL HYSTERECTOMY     CHOLECYSTECTOMY     COLONOSCOPY WITH PROPOFOL N/A 10/06/2020   Surgeon: Lanelle Bal, DO; nonbleeding internal hemorrhoids, 5 mm tubular adenoma removed, 3 mm hyperplastic polyp removed, otherwise normal exam.  Recommended 5-year surveillance.   ORIF FEMUR FRACTURE Left 11/30/2016   Procedure: OPEN REDUCTION INTERNAL FIXATION (ORIF) DISTAL FEMUR FRACTURE;  Surgeon: Samson Frederic, MD;  Location: [facility] OR;  Service: Orthopedics;  Laterality: Left;   POLYPECTOMY  10/06/2020   Procedure: POLYPECTOMY INTESTINAL;  Surgeon: Lanelle Bal, DO;  Location: [facility] END[facility] SUITE;  Service: Endoscopy;;   stroke     TONSILLECTOMY         HPI  from the history and physical done on the day of admission:     Chief Complaint: Left sided numbness   HPI: [name] is a 72 y.o. female with medical history significant for hypertension, diabetes, bipolar disorder, stroke, bipolar disorder. Patient presented to ED with left facial droop for the past 3 days, numbness to the left side of her face, with numbness to upper and lower extremities.  She also reports weakness to her left leg, feeling like her leg is heavy.  She reports she has had difficulty grasping objects with her left hand-she is not sure if this is from numbness or weakness.  She reports change in speech over the past 3 days because she is unable to move the left side of her mouth. At baseline she ambulates with a cane.   ED Course: Stable vitals.  UDS unremarkable.  Head CT unremarkable.  MRI  brain negative for acute intracranial abnormality.  EKG shows sinus rhythm. EDP talked to neurologist Dr. Selina Cooley, recommended admission for clinical stroke symptoms.  Patient may have suffered an MRI negative stroke. UA obtained in the ED, with positive nitrites small leukocytes, IV ceftriaxone also started for possible UTI.   Review of Systems: As per HPI all other systems reviewed and negative.     Hospital Course:    Assessment and Plan: 1) Left sided numbness/MRI negative stroke Left facial droop obvious on exam, with sparing of the forehead and slightly slurred speech, left-sided numbness-exam subjectively, she has numbness to her left face, left upper and lower extremity.  Good grip strength bilateral upper extremity, mildly reduced resistance against gravity to left lower extremity compared to right.  CT negative for  acute abnormality, MRI negative for acute stroke. - EDP talked to neurology, recommended admission for clinical stroke symptoms, patient may have suffered a MRI negative stroke -CTA head and neck without LVO or high-grade stenosis - Lipid panel-with LDL of 59, HDL of 41 - Hgba1c--6.4 -Aspirin 325 x 1 -Echocardiogram with EF of 65 to 70% and grade 1 diastolic dysfunction, no aortic stenosis --Change simvastatin to atorvastatin 40 mg daily -UA with positive nitrites and small leukocytes, denies urinary symptoms, IV ceftriaxone given in ED, holding off on further antibiotics with lack of symptoms, urine cultures were not obtained. -Discussed with neurologist okay to discharge home on aspirin 81 mg daily along with Plavix 75 mg daily for 21 days then after that STOP the Aspirin and  continue ONLY Plavix 75 mg daily indefinitely--for secondary stroke Prevention -- (Per The multicenter SAMMPRIS trial) follow up with neurologist Dr. Pearlean Brownie in about 4 weeks as advised -30-day event/heart monitor requested  Diabetes mellitus without complication (HCC) -A1c 6.4 reflecting excellent diabetic control PTA -Continue PTA insulin regimen  Essential hypertension Stable. -Initially allowed permissive hypertension -Okay to resume PTA meds--please see discharge med rec  Discharge Condition: stable  Follow UP   Follow-up Information     [facility] Follow up.   Why: Will contact you to schedule home health visits.        Micki Riley, MD Follow up in 1 month(s).   Specialties: Neurology, Radiology Why: Stroke follow-up Contact information: [address] [place] [address] [phone]                 Consults obtained -neurology   Diet and Activity recommendation:  As advised  Discharge Instructions    Discharge Instructions     Ambulatory referral to Neurology   Complete by: As directed    An appointment is requested in approximately: 3-4 weeks    Call MD for:  difficulty breathing, headache or visual disturbances   Complete by: As directed    Call MD for:  persistant dizziness or light-headedness   Complete by: As directed    Call MD for:  persistant nausea and vomiting   Complete by: As directed    Call MD for:  temperature >100.4   Complete by: As directed    Diet - low sodium heart healthy   Complete by: As directed    Diet Carb Modified   Complete by: As directed    Discharge instructions   Complete by: As directed    1)Please take Aspirin 81 mg daily along with Plavix 75 mg daily for 21 days then after that STOP the Aspirin and  continue ONLY Plavix 75 mg daily indefinitely--for  stroke Prevention  2)Avoid ibuprofen/Advil/Aleve/Motrin/Goody Powders/Naproxen/BC powders/Meloxicam/Diclofenac/Indomethacin and other Nonsteroidal anti-inflammatory medications as these will make you more likely to bleed and can cause stomach ulcers, can also cause Kidney problems.   3)follow up with neurologist Dr. Pearlean Brownie in about 4 weeks as advised   Increase activity slowly   Complete by: As directed         Discharge Medications     Allergies as of 08/10/2022       Reactions   Ciprofloxacin Diarrhea, Nausea And Vomiting   stomach pain   Latex Itching   Sulfa Antibiotics Itching, Rash        Medication List     STOP taking these medications    Advil Dual Action 125-250 MG Tabs Generic drug: Ibuprofen-Acetaminophen   albuterol (2.5 MG/3ML) 0.083% NEBU 3 mL, albuterol (5 MG/ML) 0.5% NEBU 0.5 mL Replaced by: albuterol (2.5 MG/3ML) 0.083% nebulizer solution You also have another medication with the same name that you need to continue taking as instructed.   simvastatin 20 MG tablet Commonly known as: ZOCOR       TAKE these medications    albuterol 108 (90 Base) MCG/ACT inhaler Commonly known as: VENTOLIN HFA Inhale 2 puffs into the lungs every 8 (eight) hours as needed for shortness of breath or wheezing. What changed:   Another medication with the same name was added. Make sure you understand how and when to take each. Another medication with the same name was removed. Continue taking this medication, and follow the directions you see here.   albuterol (2.5 MG/3ML) 0.083% nebulizer solution Commonly known as: PROVENTIL Take 3 mLs (2.5 mg total) by nebulization every 4 (four) hours as needed for wheezing or shortness of breath. What changed: You were already taking a medication with the same name, and this prescription was added. Make sure you understand how and when to take each. Replaces: albuterol (2.5 MG/3ML) 0.083% NEBU 3 mL, albuterol (5 MG/ML) 0.5% NEBU 0.5 mL   aspirin EC 81 MG tablet Take 1 tablet (81 mg total) by mouth daily with breakfast. Please take Aspirin 81 mg daily along with Plavix 75 mg daily for 21 days then after that STOP the Aspirin and  continue ONLY Plavix 75 mg daily indefinitely--for  stroke Prevention What changed:  when to take this additional instructions   atorvastatin 40 MG tablet Commonly known as: Lipitor Take 1 tablet (40 mg total) by mouth daily.   Basaglar KwikPen 100 UNIT/ML Inject 18 Units into the skin at bedtime.   Bydureon 2 MG Pen Generic drug: Exenatide ER Inject 2 mg as directed. Every Sunday.   clopidogrel 75 MG tablet Commonly known as: Plavix Take 1 tablet (75 mg total) by mouth daily. Please take Aspirin 81 mg daily along with Plavix 75 mg daily for 21 days then after that STOP the Aspirin and  continue ONLY Plavix 75 mg daily indefinitely--for  stroke Prevention   cyclobenzaprine 10 MG tablet Commonly known as: FLEXERIL Take 1 tablet (10 mg total) by mouth 2 (two) times daily as needed for muscle spasms.   dicyclomine 10 MG capsule Commonly known as: BENTYL Take 1 capsule (10 mg total) by mouth 3 (three) times daily as needed (abdominal cramping or diarrhea. Hold in setting of constipation.).   famotidine 20 MG tablet Commonly known as:  Pepcid One after supper What changed:  how much to take how to take this when to take this additional instructions   fluticasone 50 MCG/ACT nasal spray Commonly known as: FLONASE Place  2 sprays into both nostrils daily.   gabapentin 300 MG capsule Commonly known as: NEURONTIN Take 300 mg by mouth 3 (three) times daily.   hydrochlorothiazide 25 MG tablet Commonly known as: HYDRODIURIL Take 0.5 tablets (12.5 mg total) by mouth daily. What changed: how much to take   Insulin Pen Needle 31G X 5 MM Misc by Does not apply route.   irbesartan 300 MG tablet Commonly known as: AVAPRO Take 1 tablet (300 mg total) by mouth daily.   Linzess 290 MCG Caps capsule Generic drug: linaclotide TAKE 1 CAPSULE BY MOUTH ONCE DAILY BEFORE BREAKFAST What changed: See the new instructions.   meclizine 25 MG tablet Commonly known as: ANTIVERT Take 25 mg by mouth 2 (two) times daily as needed for dizziness.   metFORMIN 500 MG tablet Commonly known as: GLUCOPHAGE Take 500 mg by mouth 2 (two) times daily with a meal.   Metoprolol Tartrate 75 MG Tabs Take 75 mg by mouth 3 (three) times daily.   montelukast 10 MG tablet Commonly known as: SINGULAIR Take 10 mg by mouth at bedtime.   nitroGLYCERIN 0.4 MG SL tablet Commonly known as: NITROSTAT Place 1 tablet (0.4 mg total) under the tongue every 5 (five) minutes x 3 doses as needed for chest pain.   OVER THE COUNTER MEDICATION One touch Verio Reflect glucose machine.  One touch Verio Reflect glucose test strips 1-2 daily  One touch delica plus Lancets one lancet to check glucose bid.   pantoprazole 40 MG tablet Commonly known as: PROTONIX Take 40 mg by mouth daily.   potassium chloride 10 MEQ tablet Commonly known as: KLOR-CON Take 1 tablet (10 mEq total) by mouth daily. Take While taking HCTZ   promethazine 25 MG tablet Commonly known as: PHENERGAN Take 25 mg by mouth 2 (two) times daily as needed for vomiting or nausea. Two times  per day.   sodium chloride 0.65 % Soln nasal spray Commonly known as: OCEAN Place 1 spray into both nostrils as needed for congestion.   Vascepa 1 g capsule Generic drug: icosapent Ethyl Take 2 g by mouth 2 (two) times daily.        Major procedures and Radiology Reports - PLEASE review detailed and final reports for all details, in brief -   ECHOCARDIOGRAM COMPLETE  Result Date: 08/10/2022    ECHOCARDIOGRAM REPORT   Patient Name:   [name] [facility] Date of Exam: 08/10/2022 Medical Rec #:  [id number]        Height:       63.0 in Accession #:    [id number]       Weight:       260.8 lb Date of Birth:  Jul 01, 1950         BSA:          2.165 m Patient Age:    71 years         BP:           155/72 mmHg Patient Gender: F                HR:           78 bpm. [facility] Procedure: 2D Echo, Cardiac Doppler and Color Doppler Indications:    Stroke I63.9  History:        Patient has prior history of Echocardiogram examinations, most                 recent 05/25/2021. Risk Factors:Hypertension, Diabetes and  Former                 Smoker.  Sonographer:    Celesta Gentile RCS Referring Phys: [phone] Heloise Beecham Jalani Rominger IMPRESSIONS  1. Left ventricular ejection fraction, by estimation, is 65 to 70%. The left ventricle has normal function. Left ventricular endocardial border not optimally defined to evaluate regional wall motion. There is moderate asymmetric left ventricular hypertrophy of the septal segment. Left ventricular diastolic parameters are consistent with Grade I diastolic dysfunction (impaired relaxation).  2. Right ventricular systolic function is normal. The right ventricular size is normal. Tricuspid regurgitation signal is inadequate for assessing PA pressure.  3. The mitral valve is grossly normal. Trivial mitral valve regurgitation.  4. The aortic valve is tricuspid. Aortic valve regurgitation is not visualized. No aortic stenosis is present. Aortic valve mean gradient measures 4.0 mmHg.  Aortic valve Vmax measures 1.41 m/s.  5. The inferior vena cava is normal in size with <50% respiratory variability, suggesting right atrial pressure of 8 mmHg. Comparison(s): Prior images reviewed side by side. LVEF vigorous at 65-70%. FINDINGS  Left Ventricle: Left ventricular ejection fraction, by estimation, is 65 to 70%. The left ventricle has normal function. Left ventricular endocardial border not optimally defined to evaluate regional wall motion. The left ventricular internal cavity size was normal in size. There is moderate asymmetric left ventricular hypertrophy of the septal segment. Left ventricular diastolic parameters are consistent with Grade I diastolic dysfunction (impaired relaxation). Right Ventricle: The right ventricular size is normal. No increase in right ventricular wall thickness. Right ventricular systolic function is normal. Tricuspid regurgitation signal is inadequate for assessing PA pressure. Left Atrium: Left atrial size was normal in size. Right Atrium: Right atrial size was normal in size. Pericardium: There is no evidence of pericardial effusion. Presence of epicardial fat layer. Mitral Valve: The mitral valve is grossly normal. Mild mitral annular calcification. Trivial mitral valve regurgitation. Tricuspid Valve: The tricuspid valve is grossly normal. Tricuspid valve regurgitation is trivial. Aortic Valve: The aortic valve is tricuspid. There is mild aortic valve annular calcification. Aortic valve regurgitation is not visualized. No aortic stenosis is present. Aortic valve mean gradient measures 4.0 mmHg. Aortic valve peak gradient measures 8.0 mmHg. Aortic valve area, by VTI measures 1.94 cm. Pulmonic Valve: The pulmonic valve was grossly normal. Pulmonic valve regurgitation is trivial. Aorta: The aortic root is normal in size and structure. Venous: The inferior vena cava is normal in size with less than 50% respiratory variability, suggesting right atrial pressure of 8 mmHg.  IAS/Shunts: No atrial level shunt detected by color flow Doppler.  LEFT VENTRICLE PLAX 2D LVIDd:         3.30 cm   Diastology LVIDs:         2.40 cm   LV e' medial:    6.31 cm/s LV PW:         1.20 cm   LV E/e' medial:  7.8 LV IVS:        1.30 cm   LV e' lateral:   6.85 cm/s LVOT diam:     1.80 cm   LV E/e' lateral: 7.2 LV SV:         53 LV SV Index:   24 LVOT Area:     2.54 cm  RIGHT VENTRICLE RV S prime:     12.70 cm/s TAPSE (M-mode): 1.7 cm LEFT ATRIUM           Index        RIGHT  ATRIUM           Index LA diam:      3.20 cm 1.48 cm/m   RA Area:     11.50 cm LA Vol (A2C): 38.4 ml 17.74 ml/m  RA Volume:   23.70 ml  10.95 ml/m LA Vol (A4C): 36.1 ml 16.68 ml/m  AORTIC VALVE AV Area (Vmax):    2.02 cm AV Area (Vmean):   2.36 cm AV Area (VTI):     1.94 cm AV Vmax:           141.00 cm/s AV Vmean:          84.100 cm/s AV VTI:            0.271 m AV Peak Grad:      8.0 mmHg AV Mean Grad:      4.0 mmHg LVOT Vmax:         112.00 cm/s LVOT Vmean:        78.100 cm/s LVOT VTI:          0.207 m LVOT/AV VTI ratio: 0.76  AORTA Ao Root diam: 3.30 cm MITRAL VALVE MV Area (PHT): 2.80 cm     SHUNTS MV Decel Time: 271 msec     Systemic VTI:  0.21 m MV E velocity: 49.30 cm/s   Systemic Diam: 1.80 cm MV A velocity: 101.00 cm/s MV E/A ratio:  0.49 Nona Dell MD Electronically signed by Nona Dell MD Signature Date/Time: 08/10/2022/3:37:17 PM    Final    CT ANGIO HEAD NECK W WO CM  Result Date: 08/09/2022 CLINICAL DATA:  Left-sided facial droop for 3 days EXAM: CT ANGIOGRAPHY HEAD AND NECK WITH AND WITHOUT CONTRAST TECHNIQUE: Multidetector CT imaging of the head and neck was performed using the standard protocol during bolus administration of intravenous contrast. Multiplanar CT image reconstructions and MIPs were obtained to evaluate the vascular anatomy. Carotid stenosis measurements (when applicable) are obtained utilizing NASCET criteria, using the distal internal carotid diameter as the denominator. RADIATION  DOSE REDUCTION: This exam was performed according to the departmental dose-optimization program which includes automated exposure control, adjustment of the mA and/or kV according to patient size and/or use of iterative reconstruction technique. CONTRAST:  75mL OMNIPAQUE IOHEXOL 350 MG/ML SOLN COMPARISON:  None Available. FINDINGS: CTA NECK FINDINGS SKELETON: There is no bony spinal canal stenosis. No lytic or blastic lesion. OTHER NECK: Normal pharynx, larynx and major salivary glands. No cervical lymphadenopathy. Unremarkable thyroid gland. UPPER CHEST: No pneumothorax or pleural effusion. No nodules or masses. AORTIC ARCH: There is calcific atherosclerosis of the aortic arch. There is no aneurysm, dissection or hemodynamically significant stenosis of the visualized portion of the aorta. Conventional 3 vessel aortic branching pattern. The visualized proximal subclavian arteries are widely patent. RIGHT CAROTID SYSTEM: Normal without aneurysm, dissection or stenosis. LEFT CAROTID SYSTEM: Normal without aneurysm, dissection or stenosis. VERTEBRAL ARTERIES: Left dominant configuration. Both origins are clearly patent. There is no dissection, occlusion or flow-limiting stenosis to the skull base (V1-V3 segments). CTA HEAD FINDINGS POSTERIOR CIRCULATION: --Vertebral arteries: Normal V4 segments. --Inferior cerebellar arteries: Normal. --Basilar artery: Normal. --Superior cerebellar arteries: Normal. --Posterior cerebral arteries (PCA): Normal. ANTERIOR CIRCULATION: --Intracranial internal carotid arteries: Normal. --Anterior cerebral arteries (ACA): Normal. Both A1 segments are present. Patent anterior communicating artery (a-comm). --Middle cerebral arteries (MCA): Normal. VENOUS SINUSES: As permitted by contrast timing, patent. ANATOMIC VARIANTS: None Review of the MIP images confirms the above findings. IMPRESSION: 1. No emergent large vessel occlusion or high-grade stenosis of the  intracranial arteries. 2. Normal  CTA of the neck. Aortic atherosclerosis (ICD10-I70.0). Electronically Signed   By: Deatra Robinson M.D.   On: 08/09/2022 19:28   MR BRAIN WO CONTRAST  Result Date: 08/09/2022 CLINICAL DATA:  Neuro deficit, acute, stroke suspected. Left facial droop. Numbness in the left face, left arm, and both legs. EXAM: MRI HEAD WITHOUT CONTRAST TECHNIQUE: Multiplanar, multiecho pulse sequences of the brain and surrounding structures were obtained without intravenous contrast. COMPARISON:  Head CT 08/09/2022 FINDINGS: Brain: There is no evidence of an acute infarct, intracranial hemorrhage, mass, midline shift, or extra-axial fluid collection. Small T2 hyperintensities in the cerebral white matter bilaterally are nonspecific but compatible with mild chronic small vessel ischemic disease. The ventricles and sulci are within normal limits for age. Vascular: Major intracranial vascular flow voids are preserved. Skull and upper cervical spine: No suspicious marrow lesion. Sinuses/Orbits: Unremarkable orbits. Paranasal sinuses and mastoid air cells are clear. Other: None. IMPRESSION: 1. No acute intracranial abnormality. 2. Mild chronic small vessel ischemic disease. Electronically Signed   By: Sebastian Ache M.D.   On: 08/09/2022 14:16   CT HEAD WO CONTRAST  Result Date: 08/09/2022 CLINICAL DATA:  Provided history: Neuro deficit, acute, stroke suspected. Additional history provided: Left-sided facial droop. EXAM: CT HEAD WITHOUT CONTRAST TECHNIQUE: Contiguous axial images were obtained from the base of the skull through the vertex without intravenous contrast. RADIATION DOSE REDUCTION: This exam was performed according to the departmental dose-optimization program which includes automated exposure control, adjustment of the mA and/or kV according to patient size and/or use of iterative reconstruction technique. COMPARISON:  Head CT 03/31/2010. FINDINGS: Brain: There is no acute intracranial hemorrhage. No demarcated cortical  infarct. No extra-axial fluid collection. No evidence of an intracranial mass. No midline shift. Vascular: No hyperdense vessel. Atherosclerotic calcifications. Skull: No fracture or aggressive osseous lesion. Sinuses/Orbits: No mass or acute finding within the imaged orbits. No significant paranasal sinus disease at the imaged levels. IMPRESSION: No evidence of an acute intracranial abnormality. Electronically Signed   By: Jackey Loge D.O.   On: 08/09/2022 13:27    Today   Subjective    [name] today has no new complaints No fever  Or chills   No Nausea, Vomiting or Diarrhea         Patient has been seen and examined prior to discharge   Objective   Blood pressure 136/81, pulse 90, temperature 98.7 F (37.1 C), temperature source Oral, resp. rate 18, height 5\' 3"  (1.6 m), weight 118.3 kg, SpO2 93 %.   Intake/Output Summary (Last 24 hours) at 08/10/2022 1738 Last data filed at 08/10/2022 1300 Gross per 24 hour  Intake 700 ml  Output 1500 ml  Net -800 ml   Exam Gen:- Awake Alert, no acute distress  HEENT:- [place].AT, No sclera icterus Neck-Supple Neck,No JVD,.  Lungs-  CTAB , good air movement bilaterally CV- S1, S2 normal, regular Abd-  +ve B.Sounds, Abd Soft, No tenderness,    Extremity/Skin:- No  edema,   good pulses Psych-affect is appropriate, oriented x3 Neuro-speech improving, left-sided facial weakness and sensory concerns improving , overall improving sensory deficits to left side of face, upper extremity and lower extremity  no tremors    Data Review   CBC w Diff:  Lab Results  Component Value Date   WBC 8.5 08/09/2022   HGB 13.3 08/09/2022   HGB 14.9 05/07/2021   HCT 39.0 08/09/2022   HCT 42.7 05/07/2021   PLT 306 08/09/2022   PLT  313 05/07/2021   LYMPHOPCT 29 08/09/2022   MONOPCT 7 08/09/2022   EOSPCT 5 08/09/2022   BASOPCT 1 08/09/2022    CMP:  Lab Results  Component Value Date   NA 139 08/09/2022   K 3.5 08/09/2022   CL 102 08/09/2022   CO2  23 08/09/2022   BUN 16 08/09/2022   CREATININE 1.00 08/09/2022   PROT 7.0 08/09/2022   ALBUMIN 3.9 08/09/2022   BILITOT 0.5 08/09/2022   ALKPHOS 67 08/09/2022   AST 31 08/09/2022   ALT 25 08/09/2022  .  Total Discharge time is about 33 minutes  Shon Hale M.D on 08/10/2022 at 5:38 PM  Go to [email or web address] -  for contact info  [facility] - Office  [phone]

## 2022-08-10 NOTE — Evaluation (Signed)
Speech Language Pathology Evaluation Patient Details Name: [name] MRN: [id number] DOB: 19-Aug-1950 Today's Date: 08/10/2022 Time: 4098-1191 SLP Time Calculation (min) (ACUTE ONLY): 14 min  Problem List:  Patient Active Problem List   Diagnosis Date Noted   Left sided numbness 08/09/2022   Constipation 10/07/2021   Right lower quadrant abdominal pain 10/07/2021   Nausea without vomiting 10/07/2021   Upper airway cough syndrome 05/07/2021   GERD (gastroesophageal reflux disease) 09/04/2020   Colon cancer screening 09/04/2020   Chest pain 04/05/2018   Essential hypertension 11/30/2016   Bipolar 1 disorder (HCC) 11/30/2016   Diabetes mellitus without complication (HCC) 11/30/2016   Closed fracture of left distal femur (HCC) 11/30/2016   Other fracture of left femur, initial encounter for closed fracture (HCC) 11/29/2016   Morbid obesity (HCC) 11/29/2016   Past Medical History:  Past Medical History:  Diagnosis Date   Bipolar 1 disorder (HCC)    Cancer (HCC)    Diabetes mellitus without complication (HCC)    Hypertension    PONV (postoperative nausea and vomiting)    Stroke [facility])    Past Surgical History:  Past Surgical History:  Procedure Laterality Date   ABDOMINAL HYSTERECTOMY     CHOLECYSTECTOMY     COLONOSCOPY WITH PROPOFOL N/A 10/06/2020   Surgeon: Earnest Bailey K, DO; nonbleeding internal hemorrhoids, 5 mm tubular adenoma removed, 3 mm hyperplastic polyp removed, otherwise normal exam.  Recommended 5-year surveillance.   ORIF FEMUR FRACTURE Left 11/30/2016   Procedure: OPEN REDUCTION INTERNAL FIXATION (ORIF) DISTAL FEMUR FRACTURE;  Surgeon: Samson Frederic, MD;  Location: [facility] OR;  Service: Orthopedics;  Laterality: Left;   POLYPECTOMY  10/06/2020   Procedure: POLYPECTOMY INTESTINAL;  Surgeon: Lanelle Bal, DO;  Location: [facility] END[facility] SUITE;  Service: Endoscopy;;   stroke     TONSILLECTOMY     HPI:  [name] is a 72 y.o. female with past medical  history of hypertension, diabetes, hyperlipidemia, bipolar disorder, Hard of Hearing since birth who presented with left-sided facial droop and left-sided weakness.  Patient states sometime around noon on Friday she noticed that when she was drinking water it was coming out of the left side of her face.  She also noticed that she was dropping things from her left hand and her left leg felt weak as well as numbness on the left side.  Symptoms did not improve over the weekend therefore patient finally came to the emergency room 08/09/22. MRI did not show CVA, however neurology reports as a result of abrupt onset of symptoms which have persisted Pt did have an acute ischemic stroke, most likely MRI negative stroke.  Assessment / Plan / Recommendation Clinical Impression  Speech and language evaluation completed while Pt was sitting upright in bed. Pt presents with mild dysarthria characterized by decreased articulatory precision and slurred speech - note Pt has been hard of hearing her whole life and has slight speech differences at baseline d/t this. Intelligibility is only slightly reduced (75-100% intelltigible). Pt's cognition is largely intact and brief screen reveals memory and thought organization to be intact (with delay Pt recalled 5/5 words). Pt reports managing her finances independently and that she still drives. Pt's granddaughter lives with her but is unable to assist as she reportedly has her own health issues. Recommend HH ST to evaluate and treat dysarthria as indicated and to provide more in depth cognitive asessement to ensure safety and optimal independent functioning upon return home. Pt was agreeable to recommendations. There are no  further ST needs noted acutely, ST will sign off. Thank you for this referral.    SLP Assessment  SLP Recommendation/Assessment: All further Speech Lanaguage Pathology  needs can be addressed in the next venue of care SLP Visit Diagnosis: Dysarthria and  anarthria (R47.1)    Recommendations for follow up therapy are one component of a multi-disciplinary discharge planning process, led by the attending physician.  Recommendations may be updated based on patient status, additional functional criteria and insurance authorization.    Follow Up Recommendations  Home health SLP                SLP Evaluation Cognition  Overall Cognitive Status: Within Functional Limits for tasks assessed Memory: Appears intact       Comprehension  Auditory Comprehension Overall Auditory Comprehension: Appears within functional limits for tasks assessed Yes/No Questions: Within Functional Limits Commands: Within Functional Limits Conversation: Simple    Expression Verbal Expression Overall Verbal Expression: Appears within functional limits for tasks assessed   Oral / Motor  Oral Motor/Sensory Function Overall Oral Motor/Sensory Function: Mild impairment Facial ROM: Reduced left Facial Symmetry: Abnormal symmetry left Facial Strength: Reduced left Facial Sensation: Within Functional Limits Lingual ROM: Within Functional Limits Motor Speech Overall Motor Speech: Impaired Articulation: Impaired Level of Impairment: Word Intelligibility: Intelligibility reduced Word: 75-100% accurate Phrase: 75-100% accurate Sentence: 75-100% accurate Conversation: 75-100% accurate            Minette Manders H. Romie Levee, CCC-SLP Speech Language Pathologist   Georgetta Haber 08/10/2022, 4:12 PM

## 2022-08-10 NOTE — TOC Transition Note (Signed)
Transition of Care [facility]) - CM/SW Discharge Note   Patient Details  Name: [name] MRN: [id number] Date of Birth: Jul 05, 1950  Transition of Care [facility]) CM/SW Contact:  Karn Cassis, LCSW Phone Number: 08/10/2022, 1:22 PM   Clinical Narrative: Pt d/c today. PT/OT recommending home health. Discussed with pt who is agreeable. No preference on agency. Referred to Sarah with [facility] aware of d/c today. Pt updated. Home health orders in.       Final next level of care: Home w Home Health Services Barriers to Discharge: Barriers Resolved   Patient Goals and CMS Choice   Choice offered to / list presented to : Patient  Discharge Placement                      Patient and family notified of of transfer: 08/10/22  Discharge Plan and Services Additional resources added to the After Visit Summary for                            [facility] Arranged: PT, OT HH Agency: Other - See comment Cindie Laroche) Date HH Agency Contacted: 08/10/22 Time HH Agency Contacted: 1322 Representative spoke with at [facility] Agency: Maralyn Sago  Social Determinants of Health (SDOH) Interventions SDOH Screenings   Food Insecurity: No Food Insecurity (08/09/2022)  Housing: Low Risk  (08/09/2022)  Transportation Needs: No Transportation Needs (08/09/2022)  Utilities: Not At Risk (08/09/2022)  Tobacco Use: Medium Risk (08/09/2022)     Readmission Risk Interventions     No data to display

## 2022-08-10 NOTE — Consult Note (Addendum)
I connected with  TORIANA MCMANAMON on 08/10/22 by a video enabled telemedicine application and verified that I am speaking with the correct person using two identifiers.   I discussed the limitations of evaluation and management by telemedicine. The patient expressed understanding and agreed to proceed.  Location of patient: [facility] Location of his: [facility]  Neurology Consultation Reason for Consult: Left-sided weakness Referring Physician: Dr Kennis Carina  CC: Left-sided weakness  History is obtained from: Patient, chart review  HPI: OAKLAND COURTEMANCHE is a 72 y.o. female with past medical history of hypertension, diabetes, hyperlipidemia, bipolar disorder who presented with left-sided facial droop and left-sided weakness.  Patient states sometime around noon on Friday she noticed that when she was drinking water it was coming out of the left side of her face.  She also noticed that she was dropping things from her left hand and her left leg felt weak as well as numbness on the left side.  Symptoms did not improve over the weekend therefore patient finally came to the emergency room yesterday.  Reports taking aspirin 2 mg daily.  Denies similar symptoms in the past.  Last known normal: 08/06/2022 around noon No tPA as outside window No thrombectomy as no large vessel occlusion Event happened at home mRS 0  ROS: All other systems reviewed and negative except as noted in the HPI.  Past Medical History:  Diagnosis Date   Bipolar 1 disorder (HCC)    Cancer (HCC)    Diabetes mellitus without complication (HCC)    Hypertension    PONV (postoperative nausea and vomiting)    Stroke [facility])     Family History  Problem Relation Age of Onset   CAD Father    CAD Brother     Social History:  reports that she quit smoking about 21 years ago. Her smoking use included cigarettes. She has a 60.00 pack-year smoking history. She has never used smokeless tobacco. She  reports that she does not drink alcohol and does not use drugs.   Medications Prior to Admission  Medication Sig Dispense Refill Last Dose   albuterol (2.5 MG/3ML) 0.083% NEBU 3 mL, albuterol (5 MG/ML) 0.5% NEBU 0.5 mL Inhale into the lungs every 12 (twelve) hours. As needed   08/09/2022   albuterol (PROVENTIL HFA;VENTOLIN HFA) 108 (90 Base) MCG/ACT inhaler Inhale 2 puffs into the lungs every 8 (eight) hours as needed for shortness of breath or wheezing.  3 08/09/2022   aspirin EC 81 MG tablet Take 81 mg by mouth daily.   08/09/2022   BYDUREON 2 MG PEN Inject 2 mg as directed. Every Sunday.  0 08/08/2022   cyclobenzaprine (FLEXERIL) 10 MG tablet Take 1 tablet (10 mg total) by mouth 2 (two) times daily as needed for muscle spasms. 20 tablet 0 08/09/2022   dicyclomine (BENTYL) 10 MG capsule Take 1 capsule (10 mg total) by mouth 3 (three) times daily as needed (abdominal cramping or diarrhea. Hold in setting of constipation.). 120 capsule 1 UNK   famotidine (PEPCID) 20 MG tablet One after supper (Patient taking differently: Take 20 mg by mouth daily.) 30 tablet 3 08/09/2022   fluticasone (FLONASE) 50 MCG/ACT nasal spray Place 2 sprays into both nostrils daily.   08/09/2022   gabapentin (NEURONTIN) 300 MG capsule Take 300 mg by mouth 3 (three) times daily.   08/09/2022   hydrochlorothiazide (HYDRODIURIL) 25 MG tablet Take 25 mg by mouth daily.  0 08/09/2022   Ibuprofen-Acetaminophen (ADVIL DUAL  ACTION) 125-250 MG TABS Take 2 tablets by mouth every 8 (eight) hours as needed (pain).   UNK   Insulin Glargine (BASAGLAR KWIKPEN) 100 UNIT/ML Inject 18 Units into the skin at bedtime.   08/08/2022   irbesartan (AVAPRO) 300 MG tablet Take 1 tablet (300 mg total) by mouth daily. 90 tablet 0 08/09/2022   linaclotide (LINZESS) 290 MCG CAPS capsule TAKE 1 CAPSULE BY MOUTH ONCE DAILY BEFORE BREAKFAST (Patient taking differently: Take 290 mcg by mouth daily before breakfast.) 90 capsule 3 08/09/2022   meclizine (ANTIVERT) 25 MG  tablet Take 25 mg by mouth 2 (two) times daily as needed for dizziness.   UNK   metFORMIN (GLUCOPHAGE) 500 MG tablet Take 500 mg by mouth 2 (two) times daily with a meal.   08/09/2022   Metoprolol Tartrate 75 MG TABS Take 75 mg by mouth 3 (three) times daily.  6 08/09/2022 at 0900   montelukast (SINGULAIR) 10 MG tablet Take 10 mg by mouth at bedtime.   08/08/2022   pantoprazole (PROTONIX) 40 MG tablet Take 40 mg by mouth daily.  3 08/09/2022   promethazine (PHENERGAN) 25 MG tablet Take 25 mg by mouth 2 (two) times daily as needed for vomiting or nausea. Two times per day.  99 Past Month   simvastatin (ZOCOR) 20 MG tablet Take 20 mg by mouth daily.   08/09/2022   sodium chloride (OCEAN) 0.65 % SOLN nasal spray Place 1 spray into both nostrils as needed for congestion.   UNK   VASCEPA 1 g capsule Take 2 g by mouth 2 (two) times daily.   08/09/2022   Insulin Pen Needle 31G X 5 MM MISC by Does not apply route.   UNK   nitroGLYCERIN (NITROSTAT) 0.4 MG SL tablet Place 1 tablet (0.4 mg total) under the tongue every 5 (five) minutes x 3 doses as needed for chest pain. 25 tablet 2 UNK   OVER THE COUNTER MEDICATION One touch Verio Reflect glucose machine.  One touch Verio Reflect glucose test strips 1-2 daily  One touch delica plus Lancets one lancet to check glucose bid.         Exam: Current vital signs: BP 136/81 (BP Location: Left Arm)   Pulse 90   Temp 98.7 F (37.1 C) (Oral)   Resp 18   Ht 5\' 3"  (1.6 m)   Wt 118.3 kg   SpO2 93%   BMI 46.20 kg/m  Vital signs in last 24 hours: Temp:  [97.6 F (36.4 C)-98.9 F (37.2 C)] 98.7 F (37.1 C) (06/18 0331) Pulse Rate:  [77-90] 90 (06/18 0331) Resp:  [18-24] 18 (06/18 0331) BP: (114-145)/(52-93) 136/81 (06/18 0331) SpO2:  [92 %-98 %] 93 % (06/18 0331) Weight:  [118.3 kg-121.1 kg] 118.3 kg (06/17 1827)   Physical Exam  Constitutional: Appears well-developed and well-nourished.  Psych: Affect appropriate to situation Neuro: AOx3, no aphasia,  mild to moderate dysarthria, cranial nerves II to XII appear grossly intact except left facial droop, antigravity strength without drift in all 4 extremities, decree sensation to light touch in left upper extremity, FTN intact bilaterally  NIHSS  4  INPUTS: 1A: Level of consciousness --> 0 = Alert; keenly responsive 1B: Ask month and age --> 0 = Both questions right 1C: 'Blink eyes' & 'squeeze hands' --> 0 = Performs both tasks 2: Horizontal extraocular movements --> 0 = Normal 3: Visual fields --> 0 = No visual loss 4: Facial palsy --> 2 = Partial paralysis (lower face) 5A: Left  arm motor drift --> 0 = No drift for 10 seconds 5B: Right arm motor drift --> 0 = No drift for 10 seconds 6A: Left leg motor drift --> 0 = No drift for 5 seconds 6B: Right leg motor drift --> 0 = No drift for 5 seconds 7: Limb Ataxia --> 0 = No ataxia 8: Sensation --> 1 = Mild-moderate loss: can sense being touched  9: Language/aphasia --> 0 = Normal; no aphasia 10: Dysarthria --> 1 = Mild-moderate dysarthria: slurring but can be understood 11: Extinction/inattention --> 0 = No abnormality   I have reviewed labs in epic and the results pertinent to this consultation are: CBC:  Recent Labs  Lab 08/09/22 1212 08/09/22 1220  WBC 8.5  --   NEUTROABS 4.9  --   HGB 13.3 13.3  HCT 39.2 39.0  MCV 92.2  --   PLT 306  --     Basic Metabolic Panel:  Lab Results  Component Value Date   NA 139 08/09/2022   K 3.5 08/09/2022   CO2 23 08/09/2022   GLUCOSE 191 (H) 08/09/2022   BUN 16 08/09/2022   CREATININE 1.00 08/09/2022   CALCIUM 9.1 08/09/2022   GFRNONAA >60 08/09/2022   GFRAA >60 04/05/2018   Lipid Panel:  Lab Results  Component Value Date   LDLCALC 59 08/10/2022   HgbA1c:  Lab Results  Component Value Date   HGBA1C 6.4 (H) 08/10/2022   Urine Drug Screen:     Component Value Date/Time   LABOPIA NONE DETECTED 08/09/2022 1532   COCAINSCRNUR NONE DETECTED 08/09/2022 1532   LABBENZ NONE  DETECTED 08/09/2022 1532   AMPHETMU NONE DETECTED 08/09/2022 1532   THCU NONE DETECTED 08/09/2022 1532   LABBARB NONE DETECTED 08/09/2022 1532    Alcohol Level     Component Value Date/Time   ETH <10 08/09/2022 1212     I have reviewed the images obtained:  CT Head without contrast 08/09/2022: No evidence of an acute intracranial abnormality.   CT angio Head and Neck with contrast 08/09/2022: No emergent large vessel occlusion or high-grade stenosis of the intracranial arteries. Normal CTA of the neck.  MRI Brain wo contrast 08/09/2022:  No acute intracranial abnormality. Mild chronic small vessel ischemic disease.  ASSESSMENT/PLAN: 72 year old female presented with sudden onset of left facial droop and left-sided weakness as well as numbness.  Acute ischemic stroke -Patient had abrupt onset of symptoms which have since persisted.  Even MRI brain did not show any stroke, this is most likely MRI negative stroke. -Etiology: Most likely small vessel disease  Recommendations: -Patient is already on aspirin 81 mg daily.  Therefore would recommend aspirin 81 mg daily and Plavix 75 mg daily for 3 weeks followed by Plavix 75 mg daily only -Continue atorvastatin 40 mg daily -TTE report is pending.  If negative, recommend 30-day event monitor although low suspicion -Goal blood pressure: Normotension -PT/OT/speech and swallow eval -Stroke education including BEFAST -Follow-up with Dr. Pearlean Brownie in 4 weeks (order placed) -Discussed plan with Dr. Mariea Clonts via secure chat  Thank you for allowing [place] to participate in the care of this patient. If you have any further questions, please contact  me or neurohospitalist.   Lindie Spruce [facility] neurohospitalist

## 2022-08-11 ENCOUNTER — Telehealth: Payer: Self-pay | Admitting: *Deleted

## 2022-08-11 DIAGNOSIS — I639 Cerebral infarction, unspecified: Secondary | ICD-10-CM

## 2022-08-11 NOTE — Telephone Encounter (Signed)
Pt enrolled in [employer] and order placed.  

## 2022-08-11 NOTE — Telephone Encounter (Signed)
-----   Message from Shon Hale, MD sent at 08/10/2022  5:26 PM EDT ----- Regarding: 30 day heart monitor  -Patient had acute stroke and needs a 30-day heart monitor -- Thank U  Shon Hale, MD

## 2022-08-13 DIAGNOSIS — E119 Type 2 diabetes mellitus without complications: Secondary | ICD-10-CM | POA: Diagnosis not present

## 2022-08-13 DIAGNOSIS — E7849 Other hyperlipidemia: Secondary | ICD-10-CM | POA: Diagnosis not present

## 2022-08-13 DIAGNOSIS — I10 Essential (primary) hypertension: Secondary | ICD-10-CM | POA: Diagnosis not present

## 2022-08-17 DIAGNOSIS — Z7984 Long term (current) use of oral hypoglycemic drugs: Secondary | ICD-10-CM | POA: Diagnosis not present

## 2022-08-17 DIAGNOSIS — I129 Hypertensive chronic kidney disease with stage 1 through stage 4 chronic kidney disease, or unspecified chronic kidney disease: Secondary | ICD-10-CM | POA: Diagnosis not present

## 2022-08-17 DIAGNOSIS — Z794 Long term (current) use of insulin: Secondary | ICD-10-CM | POA: Diagnosis not present

## 2022-08-17 DIAGNOSIS — Z79899 Other long term (current) drug therapy: Secondary | ICD-10-CM | POA: Diagnosis not present

## 2022-08-17 DIAGNOSIS — N390 Urinary tract infection, site not specified: Secondary | ICD-10-CM | POA: Diagnosis not present

## 2022-08-17 DIAGNOSIS — N1832 Chronic kidney disease, stage 3b: Secondary | ICD-10-CM | POA: Diagnosis not present

## 2022-08-17 DIAGNOSIS — E785 Hyperlipidemia, unspecified: Secondary | ICD-10-CM | POA: Diagnosis not present

## 2022-08-17 DIAGNOSIS — E1142 Type 2 diabetes mellitus with diabetic polyneuropathy: Secondary | ICD-10-CM | POA: Diagnosis not present

## 2022-08-17 DIAGNOSIS — Z8673 Personal history of transient ischemic attack (TIA), and cerebral infarction without residual deficits: Secondary | ICD-10-CM | POA: Diagnosis not present

## 2022-08-19 ENCOUNTER — Ambulatory Visit: Payer: Medicare HMO | Admitting: Gastroenterology

## 2022-08-23 DIAGNOSIS — I639 Cerebral infarction, unspecified: Secondary | ICD-10-CM

## 2022-08-23 HISTORY — DX: Cerebral infarction, unspecified: I63.9

## 2022-08-24 ENCOUNTER — Telehealth: Payer: Self-pay | Admitting: Cardiology

## 2022-08-24 ENCOUNTER — Telehealth: Payer: Self-pay | Admitting: *Deleted

## 2022-08-24 NOTE — Telephone Encounter (Signed)
Patient had [employer] cardiac event monitor ordered by our [place] office on 08/11/22.  She has received her monitor but is very confused about how to start it.  I offered to give instruction over the phone but she did not feel comfortable doing that because of her confusion and also that she is hard of hearing. She would like to schedule an appointment for a nurse visit at [facility] [place] to have her monitor applied and be given instructions.  She is hard of hearing but does read lips, which, should help her understand instructions.  Please call patient to schedule.

## 2022-08-24 NOTE — Telephone Encounter (Signed)
Pt is needing help with her device that was ordered. Please advise.

## 2022-08-24 NOTE — Telephone Encounter (Signed)
Nurse apt made to apply monitor

## 2022-08-27 ENCOUNTER — Ambulatory Visit: Payer: Medicare HMO | Admitting: Internal Medicine

## 2022-08-27 ENCOUNTER — Ambulatory Visit: Payer: Medicare HMO

## 2022-08-27 DIAGNOSIS — G464 Cerebellar stroke syndrome: Secondary | ICD-10-CM | POA: Diagnosis not present

## 2022-08-30 ENCOUNTER — Emergency Department (HOSPITAL_COMMUNITY)
Admission: EM | Admit: 2022-08-30 | Discharge: 2022-08-30 | Disposition: A | Discharge status: Home / Self Care | Payer: Medicare HMO | Attending: Emergency Medicine | Admitting: Emergency Medicine

## 2022-08-30 ENCOUNTER — Emergency Department (INDEPENDENT_AMBULATORY_CARE_PROVIDER_SITE_OTHER): Payer: Medicare HMO

## 2022-08-30 ENCOUNTER — Encounter (HOSPITAL_COMMUNITY): Payer: Self-pay | Admitting: *Deleted

## 2022-08-30 ENCOUNTER — Other Ambulatory Visit: Payer: Self-pay

## 2022-08-30 DIAGNOSIS — Z91040 Latex allergy status: Secondary | ICD-10-CM | POA: Insufficient documentation

## 2022-08-30 DIAGNOSIS — R040 Epistaxis: Secondary | ICD-10-CM | POA: Diagnosis not present

## 2022-08-30 DIAGNOSIS — R739 Hyperglycemia, unspecified: Secondary | ICD-10-CM | POA: Diagnosis not present

## 2022-08-30 DIAGNOSIS — I959 Hypotension, unspecified: Secondary | ICD-10-CM | POA: Diagnosis not present

## 2022-08-30 DIAGNOSIS — Z794 Long term (current) use of insulin: Secondary | ICD-10-CM | POA: Diagnosis not present

## 2022-08-30 DIAGNOSIS — I639 Cerebral infarction, unspecified: Secondary | ICD-10-CM | POA: Diagnosis not present

## 2022-08-30 DIAGNOSIS — R000 Tachycardia, unspecified: Secondary | ICD-10-CM | POA: Diagnosis not present

## 2022-08-30 DIAGNOSIS — Z7902 Long term (current) use of antithrombotics/antiplatelets: Secondary | ICD-10-CM | POA: Diagnosis not present

## 2022-08-30 DIAGNOSIS — Z7982 Long term (current) use of aspirin: Secondary | ICD-10-CM | POA: Diagnosis not present

## 2022-08-30 DIAGNOSIS — Z743 Need for continuous supervision: Secondary | ICD-10-CM | POA: Diagnosis not present

## 2022-08-30 DIAGNOSIS — D72829 Elevated white blood cell count, unspecified: Secondary | ICD-10-CM | POA: Insufficient documentation

## 2022-08-30 DIAGNOSIS — R58 Hemorrhage, not elsewhere classified: Secondary | ICD-10-CM | POA: Diagnosis not present

## 2022-08-30 LAB — BASIC METABOLIC PANEL
Anion gap: 9 (ref 5–15)
BUN: 19 mg/dL (ref 8–23)
CO2: 24 mmol/L (ref 22–32)
Calcium: 9.1 mg/dL (ref 8.9–10.3)
Chloride: 102 mmol/L (ref 98–111)
Creatinine, Ser: 1.01 mg/dL — ABNORMAL HIGH (ref 0.44–1.00)
GFR, Estimated: >60 mL/min (ref >60)
Glucose, Bld: 166 mg/dL — ABNORMAL HIGH (ref 70–99)
Potassium: 3.4 mmol/L — ABNORMAL LOW (ref 3.5–5.1)
Sodium: 135 mmol/L (ref 135–145)

## 2022-08-30 LAB — CBC WITH DIFFERENTIAL/PLATELET
Abs Immature Granulocytes: 0.03 10*3/uL (ref 0.00–0.07)
Basophils Absolute: 0.1 10*3/uL (ref 0.0–0.1)
Basophils Relative: 1 %
Eosinophils Absolute: 0.3 10*3/uL (ref 0.0–0.5)
Eosinophils Relative: 2 %
HCT: 40 % (ref 36.0–46.0)
Hemoglobin: 13.3 g/dL (ref 12.0–15.0)
Immature Granulocytes: 0 %
Lymphocytes Relative: 16 %
Lymphs Abs: 1.9 10*3/uL (ref 0.7–4.0)
MCH: 30.6 pg (ref 26.0–34.0)
MCHC: 33.3 g/dL (ref 30.0–36.0)
MCV: 92.2 fL (ref 80.0–100.0)
Monocytes Absolute: 0.6 10*3/uL (ref 0.1–1.0)
Monocytes Relative: 5 %
Neutro Abs: 9 10*3/uL — ABNORMAL HIGH (ref 1.7–7.7)
Neutrophils Relative %: 76 %
Platelets: 322 10*3/uL (ref 150–400)
RBC: 4.34 MIL/uL (ref 3.87–5.11)
RDW: 13.2 % (ref 11.5–15.5)
WBC: 11.9 10*3/uL — ABNORMAL HIGH (ref 4.0–10.5)
nRBC: 0 % (ref 0.0–0.2)

## 2022-08-30 LAB — PROTIME-INR
INR: 1.1 (ref 0.8–1.2)
Prothrombin Time: 14.1 seconds (ref 11.4–15.2)

## 2022-08-30 MED ORDER — OXYMETAZOLINE HCL 0.05 % NA SOLN
1.0000 | Freq: Once | NASAL | Status: AC
  Administered 2022-08-30: 1 via NASAL
  Filled 2022-08-30: qty 30

## 2022-08-30 MED ORDER — CEPHALEXIN 500 MG PO CAPS: 500.0000 mg | ORAL_CAPSULE | Freq: Two times a day (BID) | ORAL | 0 refills | Status: DC

## 2022-08-30 NOTE — ED Provider Notes (Signed)
EMERGENCY DEPARTMENT AT [facility] Provider Note   CSN: [id number] Arrival date & time: 08/30/22  0957     History  Chief Complaint  Patient presents with   Epistaxis    [name] is a 72 y.o. female.  She is on Plavix.  Complaining of nosebleed that started acutely about an hour prior to arrival.  Presents via EMS who gave her some Afrin without improvement.  She is spitting up some blood also.  Denies any trauma.  History of nosebleed.  The history is provided by the patient and the EMS personnel.  Epistaxis Location:  Bilateral Severity:  Severe Duration:  1 hour Timing:  Constant Progression:  Unchanged Chronicity:  Recurrent Relieved by:  Nothing Worsened by:  Nothing Ineffective treatments:  Applying pressure and vasoconstrictors Associated symptoms: blood in oropharynx   Associated symptoms: no fever        Home Medications Prior to Admission medications   Medication Sig Start Date End Date Taking? Authorizing Provider  albuterol (PROVENTIL) (2.5 MG/3ML) 0.083% nebulizer solution Take 3 mLs (2.5 mg total) by nebulization every 4 (four) hours as needed for wheezing or shortness of breath. 08/10/22 08/10/23  Shon Hale, MD  albuterol (VENTOLIN HFA) 108 (90 Base) MCG/ACT inhaler Inhale 2 puffs into the lungs every 8 (eight) hours as needed for shortness of breath or wheezing. 08/10/22   Shon Hale, MD  aspirin EC 81 MG tablet Take 1 tablet (81 mg total) by mouth daily with breakfast. Please take Aspirin 81 mg daily along with Plavix 75 mg daily for 21 days then after that STOP the Aspirin and  continue ONLY Plavix 75 mg daily indefinitely--for  stroke Prevention 08/10/22   Shon Hale, MD  atorvastatin (LIPITOR) 40 MG tablet Take 1 tablet (40 mg total) by mouth daily. 08/10/22 08/10/23  Shon Hale, MD  BYDUREON 2 MG PEN Inject 2 mg as directed. Every Sunday. 10/18/17   [provider]  clopidogrel (PLAVIX) 75 MG tablet  Take 1 tablet (75 mg total) by mouth daily. Please take Aspirin 81 mg daily along with Plavix 75 mg daily for 21 days then after that STOP the Aspirin and  continue ONLY Plavix 75 mg daily indefinitely--for  stroke Prevention 08/10/22 08/10/23  Shon Hale, MD  cyclobenzaprine (FLEXERIL) 10 MG tablet Take 1 tablet (10 mg total) by mouth 2 (two) times daily as needed for muscle spasms. 11/08/20   Kommor, [place], MD  dicyclomine (BENTYL) 10 MG capsule Take 1 capsule (10 mg total) by mouth 3 (three) times daily as needed (abdominal cramping or diarrhea. Hold in setting of constipation.). 12/15/21   Letta Median, PA-C  famotidine (PEPCID) 20 MG tablet One after supper Patient taking differently: Take 20 mg by mouth daily. 04/26/22   Nyoka Cowden, MD  fluticasone (FLONASE) 50 MCG/ACT nasal spray Place 2 sprays into both nostrils daily.    [provider]  gabapentin (NEURONTIN) 300 MG capsule Take 300 mg by mouth 3 (three) times daily. 07/05/22   [provider]  hydrochlorothiazide (HYDRODIURIL) 25 MG tablet Take 0.5 tablets (12.5 mg total) by mouth daily. 08/10/22   Shon Hale, MD  Insulin Glargine (BASAGLAR KWIKPEN) 100 UNIT/ML Inject 18 Units into the skin at bedtime. 06/24/22   [provider]  Insulin Pen Needle 31G X 5 MM MISC by Does not apply route.    [provider]  irbesartan (AVAPRO) 300 MG tablet Take 1 tablet (300 mg total) by mouth daily.  04/26/22   Nyoka Cowden, MD  linaclotide (LINZESS) 290 MCG CAPS capsule TAKE 1 CAPSULE BY MOUTH ONCE DAILY BEFORE BREAKFAST Patient taking differently: Take 290 mcg by mouth daily before breakfast. 04/15/22   Gelene Mink, NP  meclizine (ANTIVERT) 25 MG tablet Take 25 mg by mouth 2 (two) times daily as needed for dizziness. 03/16/18   [provider]  metFORMIN (GLUCOPHAGE) 500 MG tablet Take 500 mg by mouth 2 (two) times daily with a meal.    [provider]  Metoprolol Tartrate 75 MG TABS  Take 75 mg by mouth 3 (three) times daily. 08/30/17   [provider]  montelukast (SINGULAIR) 10 MG tablet Take 10 mg by mouth at bedtime.    [provider]  nitroGLYCERIN (NITROSTAT) 0.4 MG SL tablet Place 1 tablet (0.4 mg total) under the tongue every 5 (five) minutes x 3 doses as needed for chest pain. 04/27/18   Strader, Lennart Pall, PA-C  OVER THE COUNTER MEDICATION One touch Verio Reflect glucose machine.  One touch Verio Reflect glucose test strips 1-2 daily  One touch delica plus Lancets one lancet to check glucose bid.    [provider]  pantoprazole (PROTONIX) 40 MG tablet Take 40 mg by mouth daily. 11/28/17   [provider]  potassium chloride (KLOR-CON) 10 MEQ tablet Take 1 tablet (10 mEq total) by mouth daily. Take While taking HCTZ 08/10/22   Shon Hale, MD  promethazine (PHENERGAN) 25 MG tablet Take 25 mg by mouth 2 (two) times daily as needed for vomiting or nausea. Two times per day. 12/02/17   [provider]  sodium chloride (OCEAN) 0.65 % SOLN nasal spray Place 1 spray into both nostrils as needed for congestion.    [provider]  VASCEPA 1 g capsule Take 2 g by mouth 2 (two) times daily. 06/17/20   [provider]      Allergies    Ciprofloxacin, Latex, and Sulfa antibiotics    Review of Systems   Review of Systems  Constitutional:  Negative for fever.  HENT:  Positive for nosebleeds.     Physical Exam Updated Vital Signs BP (!) 119/103 (BP Location: Left Arm)   Pulse (!) 108   Resp (!) 22   Ht 5\' 3"  (1.6 m)   Wt 118.3 kg   SpO2 97%   BMI 46.20 kg/m  Physical Exam Vitals and nursing note reviewed.  Constitutional:      General: She is not in acute distress.    Appearance: Normal appearance. She is well-developed.  HENT:     Head: Normocephalic and atraumatic.     Nose:     Comments: He is having brisk nosebleed on the left with clot, some oozing on the right. Eyes:      Conjunctiva/sclera: Conjunctivae normal.  Cardiovascular:     Rate and Rhythm: Regular rhythm. Tachycardia present.     Heart sounds: No murmur heard. Pulmonary:     Effort: Pulmonary effort is normal. No respiratory distress.     Breath sounds: Normal breath sounds. No stridor. No wheezing.  Abdominal:     Palpations: Abdomen is soft.     Tenderness: There is no abdominal tenderness. There is no guarding or rebound.  Musculoskeletal:        General: No tenderness or deformity. Normal range of motion.     Cervical back: Neck supple.  Skin:    General: Skin is warm and dry.  Neurological:  General: No focal deficit present.     Mental Status: She is alert.     GCS: GCS eye subscore is 4. GCS verbal subscore is 5. GCS motor subscore is 6.     ED Results / Procedures / Treatments   Labs (all labs ordered are listed, but only abnormal results are displayed) Labs Reviewed  BASIC METABOLIC PANEL - Abnormal; Notable for the following components:      Result Value   Potassium 3.4 (*)    Glucose, Bld 166 (*)    Creatinine, Ser 1.01 (*)    All other components within normal limits  CBC WITH DIFFERENTIAL/PLATELET - Abnormal; Notable for the following components:   WBC 11.9 (*)    Neutro Abs 9.0 (*)    All other components within normal limits  PROTIME-INR    EKG None  Radiology No results found.  Procedures .Epistaxis Management  Date/Time: 08/30/2022 5:26 PM  Performed by: Terrilee Files, MD Authorized by: Terrilee Files, MD   Consent:    Consent obtained:  Verbal   Consent given by:  Patient   Risks, benefits, and alternatives were discussed: yes     Risks discussed:  Bleeding, infection, nasal injury and pain   Alternatives discussed:  Delayed treatment, referral and observation Universal protocol:    Procedure explained and questions answered to patient or proxy's satisfaction: yes     Patient identity confirmed:  Verbally with patient Anesthesia:     Anesthesia method:  None Procedure details:    Treatment site:  L anterior   Treatment method:  Nasal balloon   Treatment complexity:  Extensive   Treatment episode: initial   Post-procedure details:    Assessment:  Bleeding stopped   Procedure completion:  Tolerated well, no immediate complications     Medications Ordered in ED Medications  oxymetazoline (AFRIN) 0.05 % nasal spray 1 spray (1 spray Each Nare Given 08/30/22 1021)    ED Course/ Medical Decision Making/ A&P Clinical Course as of 08/30/22 1725  Mon Aug 30, 2022  1037 Attempted bilateral cotton balls with Afrin spray impregnated.  Patient had significant bleeding through that.  Placed 5.5 cm nasal balloon on the left.  Bleeding has slowed. [MB]  1158 I have had to put in a little more fluid into the balloon on the left twice for continued bleeding/oozing. [MB]  1232 Remove the packing from her right nostril.  No active bleeding.  Will p.o. challenge.  She understands that organ to keep the left packing in place until she follows up with ENT. [MB]    Clinical Course User Index [MB] Terrilee Files, MD                             Medical Decision Making Amount and/or Complexity of Data Reviewed Labs: ordered.  Risk OTC drugs. Prescription drug management.   This patient complains of nosebleed; this involves an extensive number of treatment Options and is a complaint that carries with it a high risk of complications and morbidity. The differential includes nosebleed, airway compromise, anemia, foreign body  I ordered, reviewed and interpreted labs, which included CBC with elevated white count stable hemoglobin, chemistries with elevated blood sugar I ordered medication topical Afrin and reviewed PMP when indicated. Previous records obtained and reviewed in epic 1 prior ED visit for nosebleed in May Cardiac monitoring reviewed, sinus tachycardia improving to normal sinus rhythm Social determinants considered, no  significant  barriers Critical Interventions: None  After the interventions stated above, I reevaluated the patient and found patient's nosebleed is resolved. Admission and further testing considered, no indications for admission or further workup at this time.  She is tolerating p.o. now there is no signs of active bleeding.  Will leave packing in place and have her follow-up with ENT.  The nasal pledget has been removed from the right side and there is been no further bleeding on that side.  No further posterior bleeding.  Return instructions discussed         Final Clinical Impression(s) / ED Diagnoses Final diagnoses:  Left-sided epistaxis    Rx / DC Orders ED Discharge Orders     None         Terrilee Files, MD 08/30/22 1728

## 2022-08-30 NOTE — ED Notes (Signed)
MD at bedside. 

## 2022-08-30 NOTE — ED Notes (Signed)
Pt states she is unable to tolerate drinking water as she is continuously spitting up clots of blood--MD made aware

## 2022-08-30 NOTE — ED Triage Notes (Signed)
Pt brought in by [facility] from home with c/o nosebleed that started this morning 45 minutes ago. Pt does take Plavix. EMS gave 2 sprays of Afrin with no relief. Pt has 20g IV in right a/c by EMS. EMS reports pt is slightly diaphoretic as well.

## 2022-08-30 NOTE — Discharge Instructions (Addendum)
You are seen in the emergency department for a significant nosebleed.  Packing was placed with chest slowed the bleeding considerably.  We are starting you on some antibiotics.  Please keep the packing in place and call [facility] ENT down in [place] for urgent follow-up this week or next week.  Return if any worsening or concerning symptoms

## 2022-08-30 NOTE — ED Notes (Signed)
Water given to pt for PO challenge.

## 2022-09-02 DIAGNOSIS — R040 Epistaxis: Secondary | ICD-10-CM | POA: Diagnosis not present

## 2022-09-02 DIAGNOSIS — I10 Essential (primary) hypertension: Secondary | ICD-10-CM | POA: Diagnosis not present

## 2022-09-02 DIAGNOSIS — E119 Type 2 diabetes mellitus without complications: Secondary | ICD-10-CM | POA: Diagnosis not present

## 2022-09-02 DIAGNOSIS — J302 Other seasonal allergic rhinitis: Secondary | ICD-10-CM | POA: Diagnosis not present

## 2022-09-02 DIAGNOSIS — M545 Low back pain, unspecified: Secondary | ICD-10-CM | POA: Diagnosis not present

## 2022-09-09 DIAGNOSIS — Z7902 Long term (current) use of antithrombotics/antiplatelets: Secondary | ICD-10-CM | POA: Diagnosis not present

## 2022-09-09 DIAGNOSIS — R040 Epistaxis: Secondary | ICD-10-CM | POA: Diagnosis not present

## 2022-09-21 ENCOUNTER — Telehealth: Payer: Self-pay

## 2022-09-21 MED ORDER — DICYCLOMINE HCL 10 MG PO CAPS: 10.0000 mg | ORAL_CAPSULE | Freq: Three times a day (TID) | ORAL | 1 refills | Status: AC | PRN

## 2022-09-21 NOTE — Telephone Encounter (Signed)
Pt wants a refill on her Dicyclomine 10mg  caps. Here last ov was 01/27/2022. Is it ok to fill it? If so send back and I will do it. thanks

## 2022-09-21 NOTE — Addendum Note (Signed)
Addended by: Gelene Mink on: 09/21/2022 01:05 PM   Modules accepted: Orders

## 2022-09-21 NOTE — Telephone Encounter (Signed)
Dicyclomine refilled.

## 2022-09-28 ENCOUNTER — Telehealth: Payer: Self-pay

## 2022-09-28 NOTE — Telephone Encounter (Signed)
Pt was denied Dicyclomine. Documentation for an appeal is in the yellow folder (which I have). Need your signature (not a stamp).

## 2022-09-30 ENCOUNTER — Ambulatory Visit: Payer: Medicare HMO | Admitting: Gastroenterology

## 2022-10-12 ENCOUNTER — Ambulatory Visit: Payer: Medicare HMO | Admitting: Gastroenterology

## 2022-10-15 ENCOUNTER — Telehealth: Payer: Self-pay | Admitting: Cardiology

## 2022-10-15 NOTE — Telephone Encounter (Signed)
Pt notified and verbalized understanding. Pt had no questions or concerns at this time.   Pt made appt w/ VM on 9/27 in [place] office.

## 2022-10-15 NOTE — Telephone Encounter (Signed)
Former pt of H. Shari Prows, MD. Does not have gen card at this time.  Will route to DOD for result.    Will schedule any further f/u with VM

## 2022-10-15 NOTE — Telephone Encounter (Signed)
Follow Up: ? ? ? ? ?Patient is calling to see if her Monitor results are ready? ?

## 2022-10-26 ENCOUNTER — Inpatient Hospital Stay: Payer: Medicare HMO | Admitting: Neurology

## 2022-11-04 DIAGNOSIS — E1122 Type 2 diabetes mellitus with diabetic chronic kidney disease: Secondary | ICD-10-CM | POA: Diagnosis not present

## 2022-11-04 DIAGNOSIS — E7849 Other hyperlipidemia: Secondary | ICD-10-CM | POA: Diagnosis not present

## 2022-11-09 DIAGNOSIS — E1142 Type 2 diabetes mellitus with diabetic polyneuropathy: Secondary | ICD-10-CM | POA: Diagnosis not present

## 2022-11-09 DIAGNOSIS — R42 Dizziness and giddiness: Secondary | ICD-10-CM | POA: Diagnosis not present

## 2022-11-09 DIAGNOSIS — Z794 Long term (current) use of insulin: Secondary | ICD-10-CM | POA: Diagnosis not present

## 2022-11-09 DIAGNOSIS — Z7984 Long term (current) use of oral hypoglycemic drugs: Secondary | ICD-10-CM | POA: Diagnosis not present

## 2022-11-09 DIAGNOSIS — N1831 Chronic kidney disease, stage 3a: Secondary | ICD-10-CM | POA: Diagnosis not present

## 2022-11-09 DIAGNOSIS — Z79899 Other long term (current) drug therapy: Secondary | ICD-10-CM | POA: Diagnosis not present

## 2022-11-09 DIAGNOSIS — K219 Gastro-esophageal reflux disease without esophagitis: Secondary | ICD-10-CM | POA: Diagnosis not present

## 2022-11-09 DIAGNOSIS — E785 Hyperlipidemia, unspecified: Secondary | ICD-10-CM | POA: Diagnosis not present

## 2022-11-09 DIAGNOSIS — Z8673 Personal history of transient ischemic attack (TIA), and cerebral infarction without residual deficits: Secondary | ICD-10-CM | POA: Diagnosis not present

## 2022-11-09 DIAGNOSIS — I129 Hypertensive chronic kidney disease with stage 1 through stage 4 chronic kidney disease, or unspecified chronic kidney disease: Secondary | ICD-10-CM | POA: Diagnosis not present

## 2022-11-11 ENCOUNTER — Ambulatory Visit: Payer: Medicare HMO | Admitting: Gastroenterology

## 2022-11-18 ENCOUNTER — Ambulatory Visit: Payer: Medicare HMO | Admitting: Internal Medicine

## 2022-11-19 ENCOUNTER — Ambulatory Visit: Payer: Medicare HMO | Admitting: Internal Medicine

## 2022-11-25 ENCOUNTER — Encounter (HOSPITAL_COMMUNITY): Payer: Self-pay | Admitting: *Deleted

## 2022-11-25 ENCOUNTER — Inpatient Hospital Stay (HOSPITAL_COMMUNITY)
Admission: EM | Admit: 2022-11-25 | Discharge: 2022-11-29 | DRG: 871 | Disposition: A | Discharge status: Home Health | Payer: Medicare HMO | Attending: Internal Medicine | Admitting: Internal Medicine

## 2022-11-25 ENCOUNTER — Emergency Department (HOSPITAL_COMMUNITY): Payer: Medicare HMO

## 2022-11-25 ENCOUNTER — Other Ambulatory Visit: Payer: Self-pay

## 2022-11-25 DIAGNOSIS — Z7901 Long term (current) use of anticoagulants: Secondary | ICD-10-CM | POA: Diagnosis not present

## 2022-11-25 DIAGNOSIS — R338 Other retention of urine: Secondary | ICD-10-CM | POA: Diagnosis present

## 2022-11-25 DIAGNOSIS — R6520 Severe sepsis without septic shock: Secondary | ICD-10-CM | POA: Diagnosis not present

## 2022-11-25 DIAGNOSIS — Z79899 Other long term (current) drug therapy: Secondary | ICD-10-CM

## 2022-11-25 DIAGNOSIS — R197 Diarrhea, unspecified: Secondary | ICD-10-CM | POA: Diagnosis not present

## 2022-11-25 DIAGNOSIS — N136 Pyonephrosis: Secondary | ICD-10-CM | POA: Diagnosis present

## 2022-11-25 DIAGNOSIS — I480 Paroxysmal atrial fibrillation: Secondary | ICD-10-CM | POA: Diagnosis not present

## 2022-11-25 DIAGNOSIS — Z8673 Personal history of transient ischemic attack (TIA), and cerebral infarction without residual deficits: Secondary | ICD-10-CM

## 2022-11-25 DIAGNOSIS — N390 Urinary tract infection, site not specified: Secondary | ICD-10-CM | POA: Diagnosis not present

## 2022-11-25 DIAGNOSIS — I639 Cerebral infarction, unspecified: Secondary | ICD-10-CM | POA: Diagnosis present

## 2022-11-25 DIAGNOSIS — I4891 Unspecified atrial fibrillation: Secondary | ICD-10-CM | POA: Diagnosis present

## 2022-11-25 DIAGNOSIS — E872 Acidosis, unspecified: Secondary | ICD-10-CM | POA: Diagnosis present

## 2022-11-25 DIAGNOSIS — J9811 Atelectasis: Secondary | ICD-10-CM | POA: Diagnosis not present

## 2022-11-25 DIAGNOSIS — R000 Tachycardia, unspecified: Secondary | ICD-10-CM | POA: Diagnosis not present

## 2022-11-25 DIAGNOSIS — F319 Bipolar disorder, unspecified: Secondary | ICD-10-CM | POA: Diagnosis not present

## 2022-11-25 DIAGNOSIS — E119 Type 2 diabetes mellitus without complications: Secondary | ICD-10-CM

## 2022-11-25 DIAGNOSIS — Z7902 Long term (current) use of antithrombotics/antiplatelets: Secondary | ICD-10-CM | POA: Diagnosis not present

## 2022-11-25 DIAGNOSIS — N1330 Unspecified hydronephrosis: Secondary | ICD-10-CM | POA: Diagnosis not present

## 2022-11-25 DIAGNOSIS — E860 Dehydration: Secondary | ICD-10-CM | POA: Diagnosis not present

## 2022-11-25 DIAGNOSIS — Z9049 Acquired absence of other specified parts of digestive tract: Secondary | ICD-10-CM

## 2022-11-25 DIAGNOSIS — N3289 Other specified disorders of bladder: Secondary | ICD-10-CM | POA: Diagnosis not present

## 2022-11-25 DIAGNOSIS — N179 Acute kidney failure, unspecified: Secondary | ICD-10-CM | POA: Diagnosis not present

## 2022-11-25 DIAGNOSIS — R002 Palpitations: Secondary | ICD-10-CM | POA: Diagnosis not present

## 2022-11-25 DIAGNOSIS — I10 Essential (primary) hypertension: Secondary | ICD-10-CM | POA: Diagnosis not present

## 2022-11-25 DIAGNOSIS — Z8249 Family history of ischemic heart disease and other diseases of the circulatory system: Secondary | ICD-10-CM

## 2022-11-25 DIAGNOSIS — A419 Sepsis, unspecified organism: Secondary | ICD-10-CM | POA: Diagnosis not present

## 2022-11-25 DIAGNOSIS — Z794 Long term (current) use of insulin: Secondary | ICD-10-CM | POA: Diagnosis not present

## 2022-11-25 DIAGNOSIS — Z7984 Long term (current) use of oral hypoglycemic drugs: Secondary | ICD-10-CM | POA: Diagnosis not present

## 2022-11-25 DIAGNOSIS — R531 Weakness: Secondary | ICD-10-CM | POA: Diagnosis not present

## 2022-11-25 DIAGNOSIS — A4151 Sepsis due to Escherichia coli [E. coli]: Secondary | ICD-10-CM | POA: Diagnosis not present

## 2022-11-25 DIAGNOSIS — N170 Acute kidney failure with tubular necrosis: Secondary | ICD-10-CM | POA: Diagnosis not present

## 2022-11-25 DIAGNOSIS — Z743 Need for continuous supervision: Secondary | ICD-10-CM | POA: Diagnosis not present

## 2022-11-25 DIAGNOSIS — I2510 Atherosclerotic heart disease of native coronary artery without angina pectoris: Secondary | ICD-10-CM | POA: Diagnosis present

## 2022-11-25 DIAGNOSIS — Z87891 Personal history of nicotine dependence: Secondary | ICD-10-CM

## 2022-11-25 DIAGNOSIS — N3001 Acute cystitis with hematuria: Secondary | ICD-10-CM | POA: Diagnosis not present

## 2022-11-25 DIAGNOSIS — Z1152 Encounter for screening for COVID-19: Secondary | ICD-10-CM

## 2022-11-25 DIAGNOSIS — Z882 Allergy status to sulfonamides status: Secondary | ICD-10-CM

## 2022-11-25 DIAGNOSIS — I700 Atherosclerosis of aorta: Secondary | ICD-10-CM | POA: Diagnosis present

## 2022-11-25 DIAGNOSIS — Z6841 Body Mass Index (BMI) 40.0 and over, adult: Secondary | ICD-10-CM

## 2022-11-25 DIAGNOSIS — R9431 Abnormal electrocardiogram [ECG] [EKG]: Secondary | ICD-10-CM | POA: Diagnosis present

## 2022-11-25 DIAGNOSIS — B9620 Unspecified Escherichia coli [E. coli] as the cause of diseases classified elsewhere: Secondary | ICD-10-CM | POA: Diagnosis not present

## 2022-11-25 DIAGNOSIS — N3000 Acute cystitis without hematuria: Secondary | ICD-10-CM

## 2022-11-25 DIAGNOSIS — Z91040 Latex allergy status: Secondary | ICD-10-CM

## 2022-11-25 DIAGNOSIS — Z888 Allergy status to other drugs, medicaments and biological substances status: Secondary | ICD-10-CM

## 2022-11-25 DIAGNOSIS — R7881 Bacteremia: Secondary | ICD-10-CM | POA: Diagnosis not present

## 2022-11-25 LAB — CBC WITH DIFFERENTIAL/PLATELET
Abs Immature Granulocytes: 0.3 10*3/uL — ABNORMAL HIGH (ref 0.00–0.07)
Band Neutrophils: 1 %
Basophils Absolute: 0 10*3/uL (ref 0.0–0.1)
Basophils Relative: 0 %
Eosinophils Absolute: 0 10*3/uL (ref 0.0–0.5)
Eosinophils Relative: 0 %
HCT: 36.1 % (ref 36.0–46.0)
Hemoglobin: 11.8 g/dL — ABNORMAL LOW (ref 12.0–15.0)
Lymphocytes Relative: 19 %
Lymphs Abs: 2.7 10*3/uL (ref 0.7–4.0)
MCH: 27 pg (ref 26.0–34.0)
MCHC: 32.7 g/dL (ref 30.0–36.0)
MCV: 82.6 fL (ref 80.0–100.0)
Metamyelocytes Relative: 2 %
Monocytes Absolute: 1 10*3/uL (ref 0.1–1.0)
Monocytes Relative: 7 %
Neutro Abs: 10.3 10*3/uL — ABNORMAL HIGH (ref 1.7–7.7)
Neutrophils Relative %: 71 %
Platelets: 489 10*3/uL — ABNORMAL HIGH (ref 150–400)
RBC: 4.37 MIL/uL (ref 3.87–5.11)
RDW: 14.1 % (ref 11.5–15.5)
WBC: 14.3 10*3/uL — ABNORMAL HIGH (ref 4.0–10.5)
nRBC: 0 % (ref 0.0–0.2)

## 2022-11-25 LAB — COMPREHENSIVE METABOLIC PANEL
ALT: 52 U/L — ABNORMAL HIGH (ref 0–44)
AST: 80 U/L — ABNORMAL HIGH (ref 15–41)
Albumin: 2.8 g/dL — ABNORMAL LOW (ref 3.5–5.0)
Alkaline Phosphatase: 119 U/L (ref 38–126)
Anion gap: 15 (ref 5–15)
BUN: 79 mg/dL — ABNORMAL HIGH (ref 8–23)
CO2: 16 mmol/L — ABNORMAL LOW (ref 22–32)
Calcium: 8.3 mg/dL — ABNORMAL LOW (ref 8.9–10.3)
Chloride: 99 mmol/L (ref 98–111)
Creatinine, Ser: 3.45 mg/dL — ABNORMAL HIGH (ref 0.44–1.00)
GFR, Estimated: 14 mL/min — ABNORMAL LOW (ref >60)
Glucose, Bld: 154 mg/dL — ABNORMAL HIGH (ref 70–99)
Potassium: 3.5 mmol/L (ref 3.5–5.1)
Sodium: 130 mmol/L — ABNORMAL LOW (ref 135–145)
Total Bilirubin: 0.6 mg/dL (ref 0.3–1.2)
Total Protein: 7.6 g/dL (ref 6.5–8.1)

## 2022-11-25 LAB — RESP PANEL BY RT-PCR (RSV, FLU A&B, COVID)  RVPGX2
Influenza A by PCR: NEGATIVE
Influenza B by PCR: NEGATIVE
Resp Syncytial Virus by PCR: NEGATIVE
SARS Coronavirus 2 by RT PCR: NEGATIVE

## 2022-11-25 LAB — LACTIC ACID, PLASMA
Lactic Acid, Venous: 1.4 mmol/L (ref 0.5–1.9)
Lactic Acid, Venous: 2 mmol/L (ref 0.5–1.9)

## 2022-11-25 LAB — URINALYSIS, W/ REFLEX TO CULTURE (INFECTION SUSPECTED)
Bilirubin Urine: NEGATIVE
Glucose, UA: NEGATIVE mg/dL
Ketones, ur: NEGATIVE mg/dL
Nitrite: POSITIVE — AB
Protein, ur: NEGATIVE mg/dL
Specific Gravity, Urine: 1.01 (ref 1.005–1.030)
pH: 5 (ref 5.0–8.0)

## 2022-11-25 LAB — PROTIME-INR
INR: 1.2 (ref 0.8–1.2)
Prothrombin Time: 14.9 s (ref 11.4–15.2)

## 2022-11-25 LAB — APTT: aPTT: 27 s (ref 24–36)

## 2022-11-25 MED ORDER — POTASSIUM CHLORIDE CRYS ER 20 MEQ PO TBCR
40.0000 meq | EXTENDED_RELEASE_TABLET | Freq: Once | ORAL | Status: AC
  Administered 2022-11-25: 40 meq via ORAL
  Filled 2022-11-25: qty 2

## 2022-11-25 MED ORDER — LACTATED RINGERS IV SOLN: INTRAVENOUS | Status: DC

## 2022-11-25 MED ORDER — LACTATED RINGERS IV BOLUS (SEPSIS)
1000.0000 mL | Freq: Once | INTRAVENOUS | Status: AC
  Administered 2022-11-25: 1000 mL via INTRAVENOUS

## 2022-11-25 MED ORDER — METRONIDAZOLE 500 MG/100ML IV SOLN
500.0000 mg | Freq: Once | INTRAVENOUS | Status: AC
  Administered 2022-11-25: 500 mg via INTRAVENOUS
  Filled 2022-11-25: qty 100

## 2022-11-25 MED ORDER — SODIUM CHLORIDE 0.9 % IV SOLN
2.0000 g | Freq: Once | INTRAVENOUS | Status: AC
  Administered 2022-11-25: 2 g via INTRAVENOUS
  Filled 2022-11-25: qty 12.5

## 2022-11-25 MED ORDER — SODIUM CHLORIDE 0.9 % IV SOLN: INTRAVENOUS | Status: AC

## 2022-11-25 MED ORDER — SODIUM CHLORIDE 0.9 % IV BOLUS
500.0000 mL | Freq: Once | INTRAVENOUS | Status: AC
  Administered 2022-11-25: 500 mL via INTRAVENOUS

## 2022-11-25 MED ORDER — HYDROCODONE-ACETAMINOPHEN 5-325 MG PO TABS
1.0000 | ORAL_TABLET | Freq: Once | ORAL | Status: AC
  Administered 2022-11-25: 1 via ORAL
  Filled 2022-11-25 (×2): qty 1

## 2022-11-25 MED ORDER — HEPARIN (PORCINE) 25000 UT/250ML-% IV SOLN
1400.0000 [IU]/h | INTRAVENOUS | Status: DC
  Administered 2022-11-25: 1150 [IU]/h via INTRAVENOUS
  Administered 2022-11-26: 1200 [IU]/h via INTRAVENOUS
  Filled 2022-11-25 (×2): qty 250

## 2022-11-25 MED ORDER — SODIUM CHLORIDE 0.9 % IV SOLN
2.0000 g | INTRAVENOUS | Status: DC
  Administered 2022-11-26 – 2022-11-28 (×3): 2 g via INTRAVENOUS
  Filled 2022-11-25 (×3): qty 20

## 2022-11-25 MED ORDER — DIGOXIN 0.25 MG/ML IJ SOLN
500.0000 ug | Freq: Once | INTRAMUSCULAR | Status: AC
  Administered 2022-11-25: 500 ug via INTRAVENOUS
  Filled 2022-11-25: qty 2

## 2022-11-25 MED ORDER — VANCOMYCIN HCL 2000 MG/400ML IV SOLN
2000.0000 mg | Freq: Once | INTRAVENOUS | Status: AC
  Administered 2022-11-25: 2000 mg via INTRAVENOUS
  Filled 2022-11-25: qty 400

## 2022-11-25 MED ORDER — AMIODARONE HCL IN DEXTROSE 360-4.14 MG/200ML-% IV SOLN
60.0000 mg/h | INTRAVENOUS | Status: AC
  Administered 2022-11-25 (×2): 60 mg/h via INTRAVENOUS
  Filled 2022-11-25 (×2): qty 200

## 2022-11-25 MED ORDER — HEPARIN BOLUS VIA INFUSION
4000.0000 [IU] | Freq: Once | INTRAVENOUS | Status: AC
  Administered 2022-11-25: 4000 [IU] via INTRAVENOUS

## 2022-11-25 MED ORDER — AMIODARONE LOAD VIA INFUSION
150.0000 mg | Freq: Once | INTRAVENOUS | Status: AC
  Administered 2022-11-25: 150 mg via INTRAVENOUS
  Filled 2022-11-25: qty 83.34

## 2022-11-25 MED ORDER — VANCOMYCIN HCL IN DEXTROSE 1-5 GM/200ML-% IV SOLN: 1000.0000 mg | Freq: Once | INTRAVENOUS | Status: DC

## 2022-11-25 MED ORDER — SODIUM CHLORIDE 0.9 % IV SOLN: 2.0000 g | INTRAVENOUS | Status: DC

## 2022-11-25 MED ORDER — AMIODARONE HCL IN DEXTROSE 360-4.14 MG/200ML-% IV SOLN
30.0000 mg/h | INTRAVENOUS | Status: DC
  Administered 2022-11-25 – 2022-11-26 (×3): 30 mg/h via INTRAVENOUS
  Filled 2022-11-25 (×3): qty 200

## 2022-11-25 MED ORDER — VANCOMYCIN HCL 750 MG/150ML IV SOLN: 750.0000 mg | INTRAVENOUS | Status: DC

## 2022-11-25 MED ORDER — LACTATED RINGERS IV BOLUS (SEPSIS)
500.0000 mL | Freq: Once | INTRAVENOUS | Status: AC
  Administered 2022-11-25: 500 mL via INTRAVENOUS

## 2022-11-25 NOTE — Assessment & Plan Note (Addendum)
Persistently elevated heart rate 138- 152, despite amiodarone bolus and drip, over 3 hours ago.  No history of atrial fibrillation. CHAd2Vasc score- at least 6, for age,sex, stroke diabetes and hypertensive history.   Atria fibrillation likely driven by severe sepsis. -Continue heparin and amiodarone drip started in ED -I talked to cardiology Fellow on call, Dr. Ludger Nutting continuing amiodarone drip started in ED, recommended adding 1 dose of 500 mcg IV Digoxin for now.  Heart rate should improve as sepsis physiology resolves.  If persistent tachycardia, give 250 mcg of digoxin in 6 hours.  -Trend troponin -Check TSH, magnesium - Target heart rate of 120s for tonight - Formal Cardiology consult in a.m. -Echocardiogram

## 2022-11-25 NOTE — ED Notes (Signed)
Report given to me by prior rn was that pt had 1275 on bladder scan, provider was notified and order for foley catheter was placed

## 2022-11-25 NOTE — Assessment & Plan Note (Addendum)
Severe sepsis secondary to UTI.  Meeting sepsis criteria with tachycardia heart rate 138 -152, tachypnea respiratory 23-34, evidence of endorgan dysfunction, kidney injury and lactic acidosis 1.4 > 2.  Possibly some gastroenteritis/Diarrhea also with resultant dehydration..  CT abdomen and pelvis -suggesting urinary retention otherwise negative for other acute abdominal pelvic findings. -Follow-up Urine cultures -Follow-up blood cultures -Bank cefepime and metronidazole started in the ED, will continue with IV ceftriaxone - Additional 500 mL bolus given, totaling 4 L bolus - N/s 100cc/hr x 1 day -Trend lactic acid

## 2022-11-25 NOTE — ED Notes (Signed)
Antibiotics started after cultures drawn.

## 2022-11-25 NOTE — ED Triage Notes (Signed)
Pt brought in by ems for weakness.  Pt states she was vomiting last week for 3 days and had diarrhea but stopped yesterday; pt states she fell yesterday landing on her right side due to feeling weak  Pt also c/o chest tightness and right knee pain and sob

## 2022-11-25 NOTE — Assessment & Plan Note (Signed)
-   Hydrate - Stool c.diff

## 2022-11-25 NOTE — H&P (Addendum)
History and Physical    [name] QQV:[id number] DOB: 08-21-1950 DOA: 11/25/2022  PCP: [facility] Ort [facility]   Patient coming from: Home   I have personally briefly reviewed patient's old medical records in [facility]  Chief Complaint: Diarrhea, vomiting, abdominal pain.  HPI: [name] is a 72 y.o. female with medical history significant for hypertension, diabetes mellitus, bipolar disorder, stroke, morbid obesity. Patient presented to the ED with complaints of multiple episodes/continuous diarrhea that started yesterday till this morning with associated abdominal pain.  She reports about 3 days of vomiting that has now resolved but this occurred about 2 days prior to onset of diarrhea.   She reports palpitations also, with generalized weakness.  She reports some mild chest soreness to the left side of her chest without pain.  No difficulty breathing. She reports a diagnosis of atrial fibrillation in her 5s for which she was treated for.  ED Course: Tmax 98.3.  Tachycardic to 152, respiratory rate 23-34.  Blood pressure systolic 99-122. COVID test negative. UA suggestive of UTI. Lactic acid 1.4 > 2. Creatinine elevated at 3.45 from baseline of about 1. Chest x-ray negative for acute abnormality. Blood and urine cultures obtained. 3.5 L bolus given. CT abdomen and pelvis-markedly distended urinary bladder with mild bilateral hydroureteronephrosis. Patient voided in the ED. IV vancomycin cefepime and metronidazole started.  Review of Systems: As per HPI all other systems reviewed and negative.  Past Medical History:  Diagnosis Date   Bipolar 1 disorder (HCC)    Cancer (HCC)    Diabetes mellitus without complication (HCC)    Hypertension    PONV (postoperative nausea and vomiting)    Stroke [facility])     Past Surgical History:  Procedure Laterality Date   ABDOMINAL HYSTERECTOMY     CHOLECYSTECTOMY     COLONOSCOPY WITH PROPOFOL N/A 10/06/2020   Surgeon:  Lanelle Bal, DO; nonbleeding internal hemorrhoids, 5 mm tubular adenoma removed, 3 mm hyperplastic polyp removed, otherwise normal exam.  Recommended 5-year surveillance.   ORIF FEMUR FRACTURE Left 11/30/2016   Procedure: OPEN REDUCTION INTERNAL FIXATION (ORIF) DISTAL FEMUR FRACTURE;  Surgeon: Samson Frederic, MD;  Location: [facility] OR;  Service: Orthopedics;  Laterality: Left;   POLYPECTOMY  10/06/2020   Procedure: POLYPECTOMY INTESTINAL;  Surgeon: Lanelle Bal, DO;  Location: [facility] ENDO SUITE;  Service: Endoscopy;;   stroke     TONSILLECTOMY       reports that she quit smoking about 22 years ago. Her smoking use included cigarettes. She started smoking about 42 years ago. She has a 60 pack-year smoking history. She has never used smokeless tobacco. She reports that she does not drink alcohol and does not use drugs.  Allergies  Allergen Reactions   Ciprofloxacin Diarrhea and Nausea And Vomiting    stomach pain   Latex Itching   Sulfa Antibiotics Itching and Rash    Family History  Problem Relation Age of Onset   CAD Father    CAD Brother     Prior to Admission medications   Medication Sig Start Date End Date Taking? Authorizing Provider  acetaminophen (TYLENOL) 650 MG CR tablet Take 1,300 mg by mouth every 8 (eight) hours as needed for pain. BID   Yes [provider]  albuterol (PROVENTIL) (2.5 MG/3ML) 0.083% nebulizer solution Take 3 mLs (2.5 mg total) by nebulization every 4 (four) hours as needed for wheezing or shortness of breath. 08/10/22 08/10/23 Yes Arif Amendola, Courage, MD  albuterol (VENTOLIN HFA) 108 (  90 Base) MCG/ACT inhaler Inhale 2 puffs into the lungs every 8 (eight) hours as needed for shortness of breath or wheezing. 08/10/22  Yes Diarra Ceja, Courage, MD  atorvastatin (LIPITOR) 40 MG tablet Take 1 tablet (40 mg total) by mouth daily. 08/10/22 08/10/23 Yes Deyonte Cadden, Courage, MD  BYDUREON 2 MG PEN Inject 2 mg as directed. Every Sunday. 10/18/17  Yes [provider]  clopidogrel (PLAVIX) 75 MG tablet Take 1 tablet (75 mg total) by mouth daily. Please take Aspirin 81 mg daily along with Plavix 75 mg daily for 21 days then after that STOP the Aspirin and  continue ONLY Plavix 75 mg daily indefinitely--for  stroke Prevention 08/10/22 08/10/23 Yes Trenda Corliss, Courage, MD  cyclobenzaprine (FLEXERIL) 10 MG tablet Take 1 tablet (10 mg total) by mouth 2 (two) times daily as needed for muscle spasms. 11/08/20  Yes Kommor, [place], MD  dicyclomine (BENTYL) 10 MG capsule Take 1 capsule (10 mg total) by mouth 3 (three) times daily as needed (abdominal cramping or diarrhea. Hold in setting of constipation.). 09/21/22  Yes Gelene Mink, NP  famotidine (PEPCID) 20 MG tablet One after supper Patient taking differently: Take 20 mg by mouth daily. 04/26/22  Yes Nyoka Cowden, MD  fluticasone (FLONASE) 50 MCG/ACT nasal spray Place 2 sprays into both nostrils daily.   Yes [provider]  gabapentin (NEURONTIN) 300 MG capsule Take 300 mg by mouth 3 (three) times daily. 07/05/22  Yes [provider]  hydrochlorothiazide (HYDRODIURIL) 25 MG tablet Take 0.5 tablets (12.5 mg total) by mouth daily. 08/10/22  Yes Vina Byrd, Courage, MD  Insulin Glargine (BASAGLAR KWIKPEN) 100 UNIT/ML Inject 18 Units into the skin at bedtime. 06/24/22  Yes [provider]  irbesartan (AVAPRO) 300 MG tablet Take 1 tablet (300 mg total) by mouth daily. 04/26/22  Yes Nyoka Cowden, MD  linaclotide (LINZESS) 290 MCG CAPS capsule TAKE 1 CAPSULE BY MOUTH ONCE DAILY BEFORE BREAKFAST Patient taking differently: Take 290 mcg by mouth daily before breakfast. 04/15/22  Yes Gelene Mink, NP  meclizine (ANTIVERT) 25 MG tablet Take 25 mg by mouth 2 (two) times daily as needed for dizziness. 03/16/18  Yes [provider]  metFORMIN (GLUCOPHAGE) 500 MG tablet Take 500 mg by mouth 2 (two) times daily with a meal.   Yes [provider]  Metoprolol Tartrate 75 MG TABS Take 75 mg by mouth 3  (three) times daily. 08/30/17  Yes [provider]  montelukast (SINGULAIR) 10 MG tablet Take 10 mg by mouth at bedtime.   Yes [provider]  nitroGLYCERIN (NITROSTAT) 0.4 MG SL tablet Place 1 tablet (0.4 mg total) under the tongue every 5 (five) minutes x 3 doses as needed for chest pain. 04/27/18  Yes Strader, [place] M, PA-C  pantoprazole (PROTONIX) 40 MG tablet Take 40 mg by mouth daily. 11/28/17  Yes [provider]  potassium chloride (KLOR-CON) 10 MEQ tablet Take 1 tablet (10 mEq total) by mouth daily. Take While taking HCTZ Patient taking differently: Take 20 mEq by mouth daily. Take While taking HCTZ 08/10/22  Yes Ayjah Show, Courage, MD  sodium chloride (OCEAN) 0.65 % SOLN nasal spray Place 1 spray into both nostrils as needed for congestion.   Yes [provider]  VASCEPA 1 g capsule Take 2 g by mouth 2 (two) times daily. 06/17/20  Yes [provider]  Insulin Pen Needle 31G X 5 MM MISC by Does not apply route.    [provider]  OVER  THE COUNTER MEDICATION One touch Verio Reflect glucose machine.  One touch Verio Reflect glucose test strips 1-2 daily  One touch delica plus Lancets one lancet to check glucose bid.    [provider]    Physical Exam: Vitals:   11/25/22 1553 11/25/22 1600 11/25/22 1800 11/25/22 1900  BP: 120/68 (!) 116/93 124/87 121/84  Pulse:  (!) 144 (!) 138 (!) 152  Resp: (!) 24 (!) 23 (!) 27 (!) 26  Temp:  97.8 F (36.6 C)    TempSrc:  Oral    SpO2:  96% 94% 100%  Weight:      Height:        Constitutional: NAD, calm, comfortable Vitals:   11/25/22 1553 11/25/22 1600 11/25/22 1800 11/25/22 1900  BP: 120/68 (!) 116/93 124/87 121/84  Pulse:  (!) 144 (!) 138 (!) 152  Resp: (!) 24 (!) 23 (!) 27 (!) 26  Temp:  97.8 F (36.6 C)    TempSrc:  Oral    SpO2:  96% 94% 100%  Weight:      Height:       Eyes: PERRL, lids and conjunctivae normal ENMT: Mucous membranes are very dry Neck: normal,  supple, no masses, no thyromegaly Respiratory: clear to auscultation bilaterally, no wheezing, no crackles. Normal respiratory effort. No accessory muscle use.  Cardiovascular: Tachycardic., irregular rate and rhythm, no murmurs / rubs / gallops.  Abdomen: no tenderness, no masses palpated. No hepatosplenomegaly. Bowel sounds positive.  Musculoskeletal: no clubbing / cyanosis. No joint deformity upper and lower extremities. Skin: no rashes, lesions, ulcers. No induration Neurologic: No facial asymmetry, moving extremities spontaneously, speech fluent. Psychiatric: Normal judgment and insight. Alert and oriented x 3. Normal mood.   Labs on Admission: I have personally reviewed following labs and imaging studies  CBC: Recent Labs  Lab 11/25/22 1317  WBC 14.3*  NEUTROABS 10.3*  HGB 11.8*  HCT 36.1  MCV 82.6  PLT 489*   Basic Metabolic Panel: Recent Labs  Lab 11/25/22 1317  NA 130*  K 3.5  CL 99  CO2 16*  GLUCOSE 154*  BUN 79*  CREATININE 3.45*  CALCIUM 8.3*   GFR: Estimated Creatinine Clearance: 18.5 mL/min (A) (by C-G formula based on SCr of 3.45 mg/dL (H)). Liver Function Tests: Recent Labs  Lab 11/25/22 1317  AST 80*  ALT 52*  ALKPHOS 119  BILITOT 0.6  PROT 7.6  ALBUMIN 2.8*   Coagulation Profile: Recent Labs  Lab 11/25/22 1453  INR 1.2   Urine analysis:    Component Value Date/Time   COLORURINE YELLOW 11/25/2022 1625   APPEARANCEUR HAZY (A) 11/25/2022 1625   LABSPEC 1.010 11/25/2022 1625   PHURINE 5.0 11/25/2022 1625   GLUCOSEU NEGATIVE 11/25/2022 1625   HGBUR MODERATE (A) 11/25/2022 1625   BILIRUBINUR NEGATIVE 11/25/2022 1625   KETONESUR NEGATIVE 11/25/2022 1625   PROTEINUR NEGATIVE 11/25/2022 1625   UROBILINOGEN 0.2 03/29/2010 0931   NITRITE POSITIVE (A) 11/25/2022 1625   LEUKOCYTESUR MODERATE (A) 11/25/2022 1625    Radiological Exams on Admission: CT ABDOMEN PELVIS WO CONTRAST  Result Date: 11/25/2022 CLINICAL DATA:  Weakness with fall  onto right side 1 day prior to presentation. Recent vomiting and diarrhea. EXAM: CT ABDOMEN AND PELVIS WITHOUT CONTRAST TECHNIQUE: Multidetector CT imaging of the abdomen and pelvis was performed following the standard protocol without IV contrast. RADIATION DOSE REDUCTION: This exam was performed according to the departmental dose-optimization program which includes automated exposure control, adjustment of the mA and/or kV according to patient  size and/or use of iterative reconstruction technique. COMPARISON:  CT abdomen and pelvis dated 06/01/2004, CTA chest dated 01/13/2008 FINDINGS: Lower chest: Medial right middle lobe 10 x 4 mm nodule (3:19), not substantially changed from 2009. No specific follow-up imaging recommended. No pleural effusion or pneumothorax demonstrated. Partially imaged heart size is normal. Coronary artery calcifications. Hepatobiliary: No focal hepatic lesions. No intra or extrahepatic biliary ductal dilation. Cholecystectomy. Pancreas: No focal lesions or main ductal dilation. Spleen: Normal in size without focal abnormality. Adrenals/Urinary Tract: No adrenal nodules. No suspicious renal mass on this noncontrast enhanced examination or calculi. Mild bilateral hydroureteronephrosis to the level of the distended urinary bladder. Markedly distended urinary bladder. No mural thickening. Stomach/Bowel: Normal appearance of the stomach. No evidence of bowel wall thickening, distention, or inflammatory changes. Appendix is not discretely seen. Vascular/Lymphatic: Aortic atherosclerosis. No enlarged abdominal or pelvic lymph nodes. Reproductive: No adnexal masses. Other: No free fluid, fluid collection, or free air. Musculoskeletal: No acute or abnormal lytic or blastic osseous lesions. Multilevel degenerative changes of the partially imaged thoracic and lumbar spine. IMPRESSION: 1. Markedly distended urinary bladder with mild bilateral hydroureteronephrosis. Recommend correlation with urinary  retention. 2. No other acute abdominopelvic findings. 3. Aortic Atherosclerosis (ICD10-I70.0). Coronary artery calcifications. Assessment for potential risk factor modification, dietary therapy or pharmacologic therapy may be warranted, if clinically indicated. Electronically Signed   By: Agustin Cree M.D.   On: 11/25/2022 18:49   DG Chest Port 1 View  Result Date: 11/25/2022 CLINICAL DATA:  Palpitations EXAM: PORTABLE CHEST 1 VIEW COMPARISON:  X-ray 05/07/2021 FINDINGS: Underinflation. There is some linear opacity at the bases likely scar or atelectasis. No consolidation, pneumothorax or effusion. No edema. Calcified aorta. Overlapping cardiac leads. Degenerative changes of the spine. IMPRESSION: Underinflation with some basilar atelectasis. Electronically Signed   By: Karen Kays M.D.   On: 11/25/2022 15:40    EKG: Independently reviewed.  Atrial fibrillation rate 155, QTc prolonged 513.  Assessment/Plan Principal Problem:   Atrial fibrillation with RVR (HCC) Active Problems:   Severe sepsis (HCC)   UTI (urinary tract infection)   Acute urinary retention   AKI (acute kidney injury) (HCC)   Prolonged QT interval   Morbid obesity (HCC)   Essential hypertension   Bipolar 1 disorder (HCC)   Diabetes mellitus without complication (HCC)   Stroke (HCC)  Assessment and Plan: * Atrial fibrillation with RVR (HCC) Persistently elevated heart rate 138- 152, despite amiodarone bolus and drip, over 3 hours ago.  No history of atrial fibrillation. CHAd2Vasc score- at least 6, for age,sex, stroke diabetes and hypertensive history.   Atria fibrillation likely driven by severe sepsis. -Continue heparin and amiodarone drip started in ED -I talked to cardiology Fellow on call, Dr. Ludger Nutting continuing amiodarone drip started in ED, recommended adding 1 dose of 500 mcg IV Digoxin for now.  Heart rate should improve as sepsis physiology resolves.  If persistent tachycardia, give 250 mcg of digoxin  in 6 hours.  -Trend troponin -Check TSH, magnesium - Target heart rate of 120s for tonight - Formal Cardiology consult in a.m. -Echocardiogram  AKI (acute kidney injury) (HCC) Creatinine 3.45, markedly elevated from baseline ~1.  AKI in the setting of severe sepsis,dehydration from GI losses, on ARB's- irbesartan, and HCTZ, also possibly some post-renal component considering mild bilateral hydroureteronephros on CT. - Hydrate  - Foley -Hold irbesartan and HCTZ  Acute urinary retention CT abdomen and pelvis showing- Markedly distended urinary bladder with mild bilateral hydroureteronephrosis.  Patient was able to  void large amount in the ED.  Subsequently, Bladder scan in the ED, showed 1.2 L of urine. -Insert Foley catheter  Severe sepsis (HCC) Severe sepsis secondary to UTI.  Meeting sepsis criteria with tachycardia heart rate 138 -152, tachypnea respiratory 23-34, evidence of endorgan dysfunction, kidney injury and lactic acidosis 1.4 > 2.  Possibly some gastroenteritis/Diarrhea also with resultant dehydration..  CT abdomen and pelvis -suggesting urinary retention otherwise negative for other acute abdominal pelvic findings. -Follow-up Urine cultures -Follow-up blood cultures -Bank cefepime and metronidazole started in the ED, will continue with IV ceftriaxone - Additional 500 mL bolus given, totaling 4 L bolus - N/s 100cc/hr x 1 day -Trend lactic acid  Diarrhea - Hydrate - Stool c.diff   Prolonged QT interval Qt interval prolonged at 513. K- 3.5. -Check Magnesium - Replete K  Stroke (HCC) Hold Plavix while on heparin.  Diabetes mellitus without complication (HCC) Controlled.  A1c 6.4. -Home Lantus 18 units at bedtime, for now with poor oral intake -Hold Metformin - SSI- M  Essential hypertension Blood pressure soft.  Down to 99 systolic. -Hold home metoprolol, and irbesartan and HCTZ for now   CRITICAL CARE Performed by: Onnie Boer   Total critical  care time: 74 minutes  Critical care time was exclusive of separately billable procedures and treating other patients.  Critical care was necessary to treat or prevent imminent or life-threatening deterioration.  Critical care was time spent personally by me on the following activities: development of treatment plan with patient and/or surrogate as well as nursing, discussions with consultants, evaluation of patient's response to treatment, examination of patient, obtaining history from patient or surrogate, ordering and performing treatments and interventions, ordering and review of laboratory studies, ordering and review of radiographic studies, pulse oximetry and re-evaluation of patient's condition.   DVT prophylaxis: Heparin Code Status:  FULL Code Family Communication: None at bedside Disposition Plan: > 2 days Consults called: CArdiology Admission status: Inpt Stepdown I certify that at the point of admission it is my clinical judgment that the patient will require inpatient hospital care spanning beyond 2 midnights from the point of admission due to high intensity of service, high risk for further deterioration and high frequency of surveillance required.     Author: Onnie Boer, MD 11/25/2022 11:46 PM  For on call review [email or web address].

## 2022-11-25 NOTE — Assessment & Plan Note (Addendum)
Creatinine 3.45, markedly elevated from baseline ~1.  AKI in the setting of severe sepsis,dehydration from GI losses, on ARB's- irbesartan, and HCTZ, also possibly some post-renal component considering mild bilateral hydroureteronephros on CT. - Hydrate  - Foley -Hold irbesartan and HCTZ

## 2022-11-25 NOTE — ED Provider Notes (Signed)
[place] EMERGENCY DEPARTMENT AT [facility] Provider Note   CSN: [id number] Arrival date & time: 11/25/22  1252     History  Chief Complaint  Patient presents with   Weakness    [name] is a 72 y.o. female.  Patient brought in by EMS.  Patient states that she has had some abdominal discomfort and diarrhea for several days.  Certainly quite extensive for the past 2 days.  Patient's initial EKG showed atrial fibrillation with a heart rate around 144 but her blood pressures were in the low marginal like 102 systolic.  Temp 98.3 but patient felt warm.  Oxygen saturations 94% on room air.  Patient's past medical history significant for diabetes hypertension bipolar history of prior strokes she had her gallbladder removed had abdominal hysterectomy.  Patient is a former smoker quit in 2002.  Patient states that her bowel movements have been nonbloody.  But is been difficult for her to control her bowels.  She also states that she felt like her heart has been beating fast for several days.  Patient is on Plavix but she is not on any blood thinner.  Chart review seems to show patient does not have a history of atrial fibrillation.       Home Medications Prior to Admission medications   Medication Sig Start Date End Date Taking? Authorizing Provider  acetaminophen (TYLENOL) 650 MG CR tablet Take 1,300 mg by mouth every 8 (eight) hours as needed for pain. BID   Yes [provider]  albuterol (PROVENTIL) (2.5 MG/3ML) 0.083% nebulizer solution Take 3 mLs (2.5 mg total) by nebulization every 4 (four) hours as needed for wheezing or shortness of breath. 08/10/22 08/10/23 Yes Emokpae, Courage, MD  albuterol (VENTOLIN HFA) 108 (90 Base) MCG/ACT inhaler Inhale 2 puffs into the lungs every 8 (eight) hours as needed for shortness of breath or wheezing. 08/10/22  Yes Emokpae, Courage, MD  atorvastatin (LIPITOR) 40 MG tablet Take 1 tablet (40 mg total) by mouth daily. 08/10/22  08/10/23 Yes Emokpae, Courage, MD  BYDUREON 2 MG PEN Inject 2 mg as directed. Every Sunday. 10/18/17  Yes [provider]  clopidogrel (PLAVIX) 75 MG tablet Take 1 tablet (75 mg total) by mouth daily. Please take Aspirin 81 mg daily along with Plavix 75 mg daily for 21 days then after that STOP the Aspirin and  continue ONLY Plavix 75 mg daily indefinitely--for  stroke Prevention 08/10/22 08/10/23 Yes Emokpae, Courage, MD  cyclobenzaprine (FLEXERIL) 10 MG tablet Take 1 tablet (10 mg total) by mouth 2 (two) times daily as needed for muscle spasms. 11/08/20  Yes Kommor, [place], MD  dicyclomine (BENTYL) 10 MG capsule Take 1 capsule (10 mg total) by mouth 3 (three) times daily as needed (abdominal cramping or diarrhea. Hold in setting of constipation.). 09/21/22  Yes Gelene Mink, NP  famotidine (PEPCID) 20 MG tablet One after supper Patient taking differently: Take 20 mg by mouth daily. 04/26/22  Yes Nyoka Cowden, MD  fluticasone (FLONASE) 50 MCG/ACT nasal spray Place 2 sprays into both nostrils daily.   Yes [provider]  gabapentin (NEURONTIN) 300 MG capsule Take 300 mg by mouth 3 (three) times daily. 07/05/22  Yes [provider]  hydrochlorothiazide (HYDRODIURIL) 25 MG tablet Take 0.5 tablets (12.5 mg total) by mouth daily. 08/10/22  Yes Emokpae, Courage, MD  Insulin Glargine (BASAGLAR KWIKPEN) 100 UNIT/ML Inject 18 Units into the skin at bedtime. 06/24/22  Yes [provider]  irbesartan Evlyn Kanner)  300 MG tablet Take 1 tablet (300 mg total) by mouth daily. 04/26/22  Yes Nyoka Cowden, MD  linaclotide (LINZESS) 290 MCG CAPS capsule TAKE 1 CAPSULE BY MOUTH ONCE DAILY BEFORE BREAKFAST Patient taking differently: Take 290 mcg by mouth daily before breakfast. 04/15/22  Yes Gelene Mink, NP  meclizine (ANTIVERT) 25 MG tablet Take 25 mg by mouth 2 (two) times daily as needed for dizziness. 03/16/18  Yes [provider]  metFORMIN (GLUCOPHAGE) 500 MG tablet Take 500 mg  by mouth 2 (two) times daily with a meal.   Yes [provider]  Metoprolol Tartrate 75 MG TABS Take 75 mg by mouth 3 (three) times daily. 08/30/17  Yes [provider]  montelukast (SINGULAIR) 10 MG tablet Take 10 mg by mouth at bedtime.   Yes [provider]  nitroGLYCERIN (NITROSTAT) 0.4 MG SL tablet Place 1 tablet (0.4 mg total) under the tongue every 5 (five) minutes x 3 doses as needed for chest pain. 04/27/18  Yes Strader, [place] M, PA-C  pantoprazole (PROTONIX) 40 MG tablet Take 40 mg by mouth daily. 11/28/17  Yes [provider]  potassium chloride (KLOR-CON) 10 MEQ tablet Take 1 tablet (10 mEq total) by mouth daily. Take While taking HCTZ Patient taking differently: Take 20 mEq by mouth daily. Take While taking HCTZ 08/10/22  Yes Emokpae, Courage, MD  sodium chloride (OCEAN) 0.65 % SOLN nasal spray Place 1 spray into both nostrils as needed for congestion.   Yes [provider]  VASCEPA 1 g capsule Take 2 g by mouth 2 (two) times daily. 06/17/20  Yes [provider]  Insulin Pen Needle 31G X 5 MM MISC by Does not apply route.    [provider]  OVER THE COUNTER MEDICATION One touch Verio Reflect glucose machine.  One touch Verio Reflect glucose test strips 1-2 daily  One touch delica plus Lancets one lancet to check glucose bid.    [provider]      Allergies    Ciprofloxacin, Latex, and Sulfa antibiotics    Review of Systems   Review of Systems  Constitutional:  Negative for chills and fever.  HENT:  Negative for ear pain and sore throat.   Eyes:  Negative for pain and visual disturbance.  Respiratory:  Negative for cough and shortness of breath.   Cardiovascular:  Positive for palpitations. Negative for chest pain.  Gastrointestinal:  Positive for abdominal pain and diarrhea. Negative for vomiting.  Genitourinary:  Negative for dysuria and hematuria.  Musculoskeletal:  Negative for arthralgias and back  pain.  Skin:  Negative for color change and rash.  Neurological:  Negative for seizures and syncope.  All other systems reviewed and are negative.   Physical Exam Updated Vital Signs BP 101/60   Pulse (!) 142   Temp 98.3 F (36.8 C) (Oral)   Resp (!) 23   Ht 1.6 m (5\' 3" )   Wt 114.8 kg   SpO2 96%   BMI 44.82 kg/m  Physical Exam Vitals and nursing note reviewed.  Constitutional:      General: She is not in acute distress.    Appearance: She is well-developed.  HENT:     Head: Normocephalic and atraumatic.     Mouth/Throat:     Mouth: Mucous membranes are dry.  Eyes:     Conjunctiva/sclera: Conjunctivae normal.  Cardiovascular:     Rate and Rhythm: Tachycardia present. Rhythm irregular.     Heart sounds: No murmur heard.  Pulmonary:     Effort: Pulmonary effort is normal. No respiratory distress.     Breath sounds: Normal breath sounds.  Abdominal:     Palpations: Abdomen is soft.     Tenderness: There is abdominal tenderness. There is no guarding.     Comments: Mild tenderness diffusely to palpation of the abdomen.  Musculoskeletal:        General: Swelling present.     Cervical back: Neck supple.  Skin:    General: Skin is warm and dry.     Capillary Refill: Capillary refill takes less than 2 seconds.  Neurological:     General: No focal deficit present.     Mental Status: She is alert and oriented to person, place, and time.  Psychiatric:        Mood and Affect: Mood normal.     ED Results / Procedures / Treatments   Labs (all labs ordered are listed, but only abnormal results are displayed) Labs Reviewed  CBC WITH DIFFERENTIAL/PLATELET - Abnormal; Notable for the following components:      Result Value   WBC 14.3 (*)    Hemoglobin 11.8 (*)    Platelets 489 (*)    Neutro Abs 10.3 (*)    Abs Immature Granulocytes 0.30 (*)    All other components within normal limits  COMPREHENSIVE METABOLIC PANEL - Abnormal; Notable for the following components:    Sodium 130 (*)    CO2 16 (*)    Glucose, Bld 154 (*)    BUN 79 (*)    Creatinine, Ser 3.45 (*)    Calcium 8.3 (*)    Albumin 2.8 (*)    AST 80 (*)    ALT 52 (*)    GFR, Estimated 14 (*)    All other components within normal limits  RESP PANEL BY RT-PCR (RSV, FLU A&B, COVID)  RVPGX2  CULTURE, BLOOD (ROUTINE X 2)  CULTURE, BLOOD (ROUTINE X 2)  C DIFFICILE QUICK SCREEN W PCR REFLEX    GASTROINTESTINAL PANEL BY PCR, STOOL (REPLACES STOOL CULTURE)  LACTIC ACID, PLASMA  PROTIME-INR  APTT  LACTIC ACID, PLASMA  URINALYSIS, W/ REFLEX TO CULTURE (INFECTION SUSPECTED)    EKG EKG Interpretation Date/Time:  Thursday November 25 2022 13:01:38 EDT Ventricular Rate:  155 PR Interval:    QRS Duration:  76 QT Interval:  319 QTC Calculation: 513 R Axis:   8  Text Interpretation: Atrial fibrillation with rapid V-rate Low voltage, precordial leads Consider anterior infarct New since previous tracing Confirmed by Vanetta Mulders [id number]) on 11/25/2022 2:26:18 PM  Radiology No results found.  Procedures Procedures    Medications Ordered in ED Medications  lactated ringers infusion ( Intravenous New Bag/Given 11/25/22 1533)  lactated ringers bolus 1,000 mL (1,000 mLs Intravenous New Bag/Given 11/25/22 1452)    And  lactated ringers bolus 1,000 mL (1,000 mLs Intravenous New Bag/Given 11/25/22 1512)    And  lactated ringers bolus 1,000 mL (1,000 mLs Intravenous New Bag/Given 11/25/22 1532)    And  lactated ringers bolus 500 mL (has no administration in time range)  metroNIDAZOLE (FLAGYL) IVPB 500 mg (500 mg Intravenous New Bag/Given 11/25/22 1520)  vancomycin (VANCOREADY) IVPB 2000 mg/400 mL (2,000 mg Intravenous New Bag/Given 11/25/22 1532)  ceFEPIme (MAXIPIME) 2 g in sodium chloride 0.9 % 100 mL IVPB (2 g Intravenous New Bag/Given 11/25/22 1501)    ED Course/ Medical Decision Making/ A&P  Medical Decision Making Amount and/or Complexity of Data  Reviewed Labs: ordered. Radiology: ordered. ECG/medicine tests: ordered.  Risk Prescription drug management.  CRITICAL CARE Performed by: Vanetta Mulders Total critical care time: 45 minutes Critical care time was exclusive of separately billable procedures and treating other patients. Critical care was necessary to treat or prevent imminent or life-threatening deterioration. Critical care was time spent personally by me on the following activities: development of treatment plan with patient and/or surrogate as well as nursing, discussions with consultants, evaluation of patient's response to treatment, examination of patient, obtaining history from patient or surrogate, ordering and performing treatments and interventions, ordering and review of laboratory studies, ordering and review of radiographic studies, pulse oximetry and re-evaluation of patient's condition.  Patient tachycardic here looks like atrial fibrillation.  The patient also very dehydrated.  Patient CBC white counts 14.3 hemoglobin 11.8.  Lactic acid ordered.  So we did start sepsis protocol.  And clearly patient's GFR was only 14 which is drastically worse than usual.  Sodium 130 CO2 16 glucose 154 liver function test mild AST ALT elevation total bili normal.  Anion gap normal.  Patient has chest x-ray that was done.  As stated we will start on sepsis protocol.  Will hold off on giving anything to control the heart rate.  Because she appears so dehydrated and her blood pressures are like 102 systolic.  As we get her better hydrated then she may need to be started on diltiazem.  Patient was started on broad-spectrum antibiotics.  Due to the diarrhea patient is going to have CT of the abdomen.  C. difficile is been ordered and GI panel has been ordered.  Ultimately patient will require admission.   Final Clinical Impression(s) / ED Diagnoses Final diagnoses:  Acute kidney injury (HCC)  Dehydration  Diarrhea, unspecified  type    Rx / DC Orders ED Discharge Orders     None         Vanetta Mulders, MD 11/25/22 1538

## 2022-11-25 NOTE — ED Provider Notes (Signed)
Pt signed out by Dr. Deretha Emory pending CT  Pt has new onset afib.  HR remaining in the 140s despite IVFs.  BP is low, so amiodarone started.  Heparin started.  HR down to the 130s.  Code sepsis called.  Pt given sepsis fluids and IV abx.  She does have a UTI.  Covid/flu/rsv neg.  No stool while here.  CT abd/pelvis:   Markedly distended urinary bladder with mild bilateral  hydroureteronephrosis. Recommend correlation with urinary retention.  2. No other acute abdominopelvic findings.  3. Aortic Atherosclerosis (ICD10-I70.0). Coronary artery  calcifications. Assessment for potential risk factor modification,  dietary therapy or pharmacologic therapy may be warranted, if  clinically indicated.   Pt did urinate a large amount after coming back from CT.   Pt d/w Dr. Mariea Clonts (triad) who will admit.  CRITICAL CARE Performed by: Jacalyn Lefevre   Total critical care time: 30 minutes  Critical care time was exclusive of separately billable procedures and treating other patients.  Critical care was necessary to treat or prevent imminent or life-threatening deterioration.  Critical care was time spent personally by me on the following activities: development of treatment plan with patient and/or surrogate as well as nursing, discussions with consultants, evaluation of patient's response to treatment, examination of patient, obtaining history from patient or surrogate, ordering and performing treatments and interventions, ordering and review of laboratory studies, ordering and review of radiographic studies, pulse oximetry and re-evaluation of patient's condition.      Jacalyn Lefevre, MD 11/25/22 1944

## 2022-11-25 NOTE — ED Notes (Signed)
Pt requested that her granddaughter be contacted and updated. Granddaughter called up to ED and update was given.

## 2022-11-25 NOTE — Assessment & Plan Note (Signed)
Blood pressure soft.  Down to 99 systolic. -Hold home metoprolol, and irbesartan and HCTZ for now

## 2022-11-25 NOTE — Assessment & Plan Note (Signed)
CT abdomen and pelvis showing- Markedly distended urinary bladder with mild bilateral hydroureteronephrosis.  Patient was able to void large amount in the ED.  Subsequently, Bladder scan in the ED, showed 1.2 L of urine. -Insert Foley catheter

## 2022-11-25 NOTE — Consult Note (Signed)
PHARMACY - ANTICOAGULATION CONSULT NOTE  Pharmacy Consult for heparin infusion Indication: atrial fibrillation  Allergies  Allergen Reactions   Ciprofloxacin Diarrhea and Nausea And Vomiting    stomach pain   Latex Itching   Sulfa Antibiotics Itching and Rash    Patient Measurements: Height: 5\' 3"  (160 cm) Weight: 114.8 kg (253 lb) IBW/kg (Calculated) : 52.4 Heparin Dosing Weight: 80.3 kg  Vital Signs: Temp: 97.8 F (36.6 C) (10/03 1600) Temp Source: Oral (10/03 1600) BP: 116/93 (10/03 1600) Pulse Rate: 144 (10/03 1600)  Labs: Recent Labs    11/25/22 1317 11/25/22 1453  HGB 11.8*  --   HCT 36.1  --   PLT 489*  --   APTT  --  27  LABPROT  --  14.9  INR  --  1.2  CREATININE 3.45*  --     Estimated Creatinine Clearance: 18.5 mL/min (A) (by C-G formula based on SCr of 3.45 mg/dL (H)).   Medical History: Past Medical History:  Diagnosis Date   Bipolar 1 disorder (HCC)    Cancer (HCC)    Diabetes mellitus without complication (HCC)    Hypertension    PONV (postoperative nausea and vomiting)    Stroke (HCC)     Medications:  No home anticoagulants per pharmacist review  Assessment: 72 yo female presented to ED with abdominal discomfort and diarrhea.  EKG in ED showed patient to be in Afib.  Pharmacy consulted for initiation of heparin infusion.  Goal of Therapy:  Heparin level 0.3-0.7 units/ml Monitor platelets by anticoagulation protocol: Yes   Plan:  Give 4000 units bolus x 1 Start heparin infusion at 1150 units/hr Check anti-Xa level in 8 hours and daily while on heparin Continue to monitor H&H and platelets  Barrie Folk, PharmD 11/25/2022,5:43 PM

## 2022-11-25 NOTE — Assessment & Plan Note (Addendum)
Controlled.  A1c 6.4. -Home Lantus 18 units at bedtime, for now with poor oral intake -Hold Metformin - SSI- M

## 2022-11-25 NOTE — Assessment & Plan Note (Signed)
Qt interval prolonged at 513. K- 3.5. -Check Magnesium - Replete K

## 2022-11-25 NOTE — Assessment & Plan Note (Signed)
Hold Plavix while on heparin.

## 2022-11-25 NOTE — Progress Notes (Signed)
Pharmacy Antibiotic Note  [name] is a 72 y.o. female admitted on 11/25/2022 with  unknown source of infection .  Pharmacy has been consulted for Cefepime and Vancomycin dosing. AKI Scr 3.46  Code sepsis  Plan: Vancomycin 2000 mg IV loading dose then 750mg  IV Q 48 hrs. Goal AUC 400-550. Expected AUC: 441 SCr used: 3.45  Cefepime 2gm IV q24h F/U cxs and clinical progress Monitor V/S, labs and levels as indicated  Height: 5\' 3"  (160 cm) Weight: 114.8 kg (253 lb) IBW/kg (Calculated) : 52.4  Temp (24hrs), Avg:98.3 F (36.8 C), Min:98.3 F (36.8 C), Max:98.3 F (36.8 C)  Recent Labs  Lab 11/25/22 1317  WBC 14.3*  CREATININE 3.45*    Estimated Creatinine Clearance: 18.5 mL/min (A) (by C-G formula based on SCr of 3.45 mg/dL (H)).    Allergies  Allergen Reactions   Ciprofloxacin Diarrhea and Nausea And Vomiting    stomach pain   Latex Itching   Sulfa Antibiotics Itching and Rash    Antimicrobials this admission: Vancomycin 10/3 >>  Cefepime 10/3 >>   Microbiology results: 10/3 BCx: pending  MRSA PCR:   Thank you for allowing pharmacy to be a part of this patient's care.  Elder Cyphers, BS Pharm D, BCPS Clinical Pharmacist 11/25/2022 2:59 PM

## 2022-11-25 NOTE — Sepsis Progress Note (Signed)
Elink monitoring for the code sepsis protocol.  

## 2022-11-25 NOTE — ED Notes (Addendum)
Error in entry.

## 2022-11-26 ENCOUNTER — Other Ambulatory Visit (HOSPITAL_COMMUNITY): Payer: Medicare HMO

## 2022-11-26 ENCOUNTER — Other Ambulatory Visit (HOSPITAL_COMMUNITY): Payer: Self-pay | Admitting: *Deleted

## 2022-11-26 DIAGNOSIS — I4891 Unspecified atrial fibrillation: Secondary | ICD-10-CM | POA: Diagnosis not present

## 2022-11-26 LAB — BLOOD CULTURE ID PANEL (REFLEXED) - BCID2

## 2022-11-26 LAB — BASIC METABOLIC PANEL
Anion gap: 15 (ref 5–15)
BUN: 58 mg/dL — ABNORMAL HIGH (ref 8–23)
CO2: 16 mmol/L — ABNORMAL LOW (ref 22–32)
Calcium: 8.1 mg/dL — ABNORMAL LOW (ref 8.9–10.3)
Chloride: 105 mmol/L (ref 98–111)
Creatinine, Ser: 2.58 mg/dL — ABNORMAL HIGH (ref 0.44–1.00)
GFR, Estimated: 19 mL/min — ABNORMAL LOW (ref >60)
Glucose, Bld: 127 mg/dL — ABNORMAL HIGH (ref 70–99)
Potassium: 3.8 mmol/L (ref 3.5–5.1)
Sodium: 136 mmol/L (ref 135–145)

## 2022-11-26 LAB — HEPARIN LEVEL (UNFRACTIONATED)
Heparin Unfractionated: 0.31 [IU]/mL (ref 0.30–0.70)
Heparin Unfractionated: 0.33 [IU]/mL (ref 0.30–0.70)

## 2022-11-26 LAB — CBC
HCT: 33.4 % — ABNORMAL LOW (ref 36.0–46.0)
Hemoglobin: 10.8 g/dL — ABNORMAL LOW (ref 12.0–15.0)
MCH: 27.2 pg (ref 26.0–34.0)
MCHC: 32.3 g/dL (ref 30.0–36.0)
MCV: 84.1 fL (ref 80.0–100.0)
Platelets: 461 10*3/uL — ABNORMAL HIGH (ref 150–400)
RBC: 3.97 MIL/uL (ref 3.87–5.11)
RDW: 14.5 % (ref 11.5–15.5)
WBC: 14 10*3/uL — ABNORMAL HIGH (ref 4.0–10.5)
nRBC: 0 % (ref 0.0–0.2)

## 2022-11-26 LAB — MAGNESIUM: Magnesium: 1.7 mg/dL (ref 1.7–2.4)

## 2022-11-26 LAB — GLUCOSE, CAPILLARY
Glucose-Capillary: 110 mg/dL — ABNORMAL HIGH (ref 70–99)
Glucose-Capillary: 120 mg/dL — ABNORMAL HIGH (ref 70–99)
Glucose-Capillary: 207 mg/dL — ABNORMAL HIGH (ref 70–99)

## 2022-11-26 LAB — CBG MONITORING, ED
Glucose-Capillary: 120 mg/dL — ABNORMAL HIGH (ref 70–99)
Glucose-Capillary: 103 mg/dL — ABNORMAL HIGH (ref 70–99)

## 2022-11-26 LAB — LACTIC ACID, PLASMA: Lactic Acid, Venous: 1.3 mmol/L (ref 0.5–1.9)

## 2022-11-26 LAB — T4, FREE: Free T4: 1.63 ng/dL — ABNORMAL HIGH (ref 0.61–1.12)

## 2022-11-26 LAB — TSH: TSH: 0.277 u[IU]/mL — ABNORMAL LOW (ref 0.350–4.500)

## 2022-11-26 LAB — TROPONIN I (HIGH SENSITIVITY)
Troponin I (High Sensitivity): 10 ng/L (ref <18)
Troponin I (High Sensitivity): 10 ng/L (ref <18)

## 2022-11-26 LAB — MRSA NEXT GEN BY PCR, NASAL: MRSA by PCR Next Gen: NOT DETECTED

## 2022-11-26 MED ORDER — PROMETHAZINE HCL 12.5 MG PO TABS
12.5000 mg | ORAL_TABLET | Freq: Four times a day (QID) | ORAL | Status: DC | PRN
  Administered 2022-11-26 – 2022-11-28 (×4): 12.5 mg via ORAL
  Filled 2022-11-26 (×4): qty 1

## 2022-11-26 MED ORDER — ACETAMINOPHEN 325 MG PO TABS
650.0000 mg | ORAL_TABLET | Freq: Four times a day (QID) | ORAL | Status: DC | PRN
  Administered 2022-11-26 – 2022-11-28 (×3): 650 mg via ORAL
  Filled 2022-11-26 (×4): qty 2

## 2022-11-26 MED ORDER — INSULIN ASPART 100 UNIT/ML IJ SOLN
0.0000 [IU] | Freq: Four times a day (QID) | INTRAMUSCULAR | Status: DC
  Administered 2022-11-26: 3 [IU] via SUBCUTANEOUS
  Administered 2022-11-27 – 2022-11-28 (×3): 1 [IU] via SUBCUTANEOUS
  Administered 2022-11-28: 2 [IU] via SUBCUTANEOUS
  Administered 2022-11-28 – 2022-11-29 (×3): 1 [IU] via SUBCUTANEOUS

## 2022-11-26 MED ORDER — MONTELUKAST SODIUM 10 MG PO TABS
10.0000 mg | ORAL_TABLET | Freq: Every day | ORAL | Status: DC
  Administered 2022-11-26 – 2022-11-28 (×3): 10 mg via ORAL
  Filled 2022-11-26 (×3): qty 1

## 2022-11-26 MED ORDER — ACETAMINOPHEN 650 MG RE SUPP: 650.0000 mg | Freq: Four times a day (QID) | RECTAL | Status: DC | PRN

## 2022-11-26 MED ORDER — CHLORHEXIDINE GLUCONATE CLOTH 2 % EX PADS
6.0000 | MEDICATED_PAD | Freq: Every day | CUTANEOUS | Status: DC
  Administered 2022-11-26 – 2022-11-29 (×4): 6 via TOPICAL

## 2022-11-26 MED ORDER — METOPROLOL TARTRATE 25 MG PO TABS
12.5000 mg | ORAL_TABLET | Freq: Two times a day (BID) | ORAL | Status: DC
  Administered 2022-11-26 – 2022-11-28 (×5): 12.5 mg via ORAL
  Filled 2022-11-26 (×5): qty 1

## 2022-11-26 NOTE — Consult Note (Signed)
CARDIOLOGY CONSULT NOTE       Patient ID: [name] MRN: [id number] DOB/AGE: 04-10-1950 72 y.o.  Admit date: 11/25/2022 Referring Physician: Mariea Clonts Primary Physician: Ponciano Ort [facility] Primary Cardiologist: New Reason for Consultation: Afib  Principal Problem:   Atrial fibrillation with RVR [facility]) Active Problems:   Morbid obesity (HCC)   Essential hypertension   Bipolar 1 disorder (HCC)   Diabetes mellitus without complication (HCC)   Severe sepsis (HCC)   UTI (urinary tract infection)   Acute urinary retention   AKI (acute kidney injury) (HCC)   Stroke (HCC)   Prolonged QT interval   Diarrhea   HPI:  72 y.o. asked to see by Dr Mariea Clonts for afib. Admitted with sepsis, dehydration, diarrhea and acute renal failure. History of PAF long time ago She has HTN, DM, bipolar, stroke and morbid obesity. [place] VASC 5. Only on Plavix at home not any anticoagulation. 3 days of vominting an ddiarrhea. Noted weakness and elevated HR in this setting No dyspnea and some muscular sounding SSCP only after vomiting Baseline functional status poor since 2018 when she broke her left leg. Lives with grand daughter Does do ADL's and drives car still Cr 3.45 with CT showing dilated bladder an dhydronephrosis   ROS All other systems reviewed and negative except as noted above  Past Medical History:  Diagnosis Date   Bipolar 1 disorder (HCC)    Cancer (HCC)    Diabetes mellitus without complication (HCC)    Hypertension    PONV (postoperative nausea and vomiting)    Stroke (HCC)     Family History  Problem Relation Age of Onset   CAD Father    CAD Brother     Social History   Socioeconomic History   Marital status: Married    Spouse name: Not on file   Number of children: Not on file   Years of education: Not on file   Highest education level: Not on file  Occupational History   Not on file  Tobacco Use   Smoking status: Former    Current packs/day: 0.00    Average  packs/day: 3.0 packs/day for 20.0 years (60.0 ttl pk-yrs)    Types: Cigarettes    Start date: 09/26/1980    Quit date: 09/26/2000    Years since quitting: 22.1   Smokeless tobacco: Never  Vaping Use   Vaping status: Never Used  Substance and Sexual Activity   Alcohol use: No   Drug use: No   Sexual activity: Not Currently  Other Topics Concern   Not on file  Social History Narrative   Not on file   Social Determinants of Health   Financial Resource Strain: Not on file  Food Insecurity: No Food Insecurity (11/25/2022)   Hunger Vital Sign    Worried About Running Out of Food in the Last Year: Never true    Ran Out of Food in the Last Year: Never true  Transportation Needs: No Transportation Needs (11/25/2022)   PRAPARE - Administrator, Civil Service (Medical): No    Lack of Transportation (Non-Medical): No  Physical Activity: Not on file  Stress: Not on file  Social Connections: Not on file  Intimate Partner Violence: Not At Risk (11/25/2022)   Humiliation, Afraid, Rape, and Kick questionnaire    Fear of Current or Ex-Partner: No    Emotionally Abused: No    Physically Abused: No    Sexually Abused: No    Past Surgical History:  Procedure Laterality Date   ABDOMINAL HYSTERECTOMY     CHOLECYSTECTOMY     COLONOSCOPY WITH PROPOFOL N/A 10/06/2020   Surgeon: Earnest Bailey K, DO; nonbleeding internal hemorrhoids, 5 mm tubular adenoma removed, 3 mm hyperplastic polyp removed, otherwise normal exam.  Recommended 5-year surveillance.   ORIF FEMUR FRACTURE Left 11/30/2016   Procedure: OPEN REDUCTION INTERNAL FIXATION (ORIF) DISTAL FEMUR FRACTURE;  Surgeon: Samson Frederic, MD;  Location: [facility] OR;  Service: Orthopedics;  Laterality: Left;   POLYPECTOMY  10/06/2020   Procedure: POLYPECTOMY INTESTINAL;  Surgeon: Lanelle Bal, DO;  Location: [facility];  Service: Endoscopy;;   stroke     TONSILLECTOMY        Current Facility-Administered Medications:    0.9 %   sodium chloride infusion, , Intravenous, Continuous, Emokpae, Ejiroghene E, MD, Last Rate: 100 mL/hr at 11/25/22 2333, New Bag at 11/25/22 2333   acetaminophen (TYLENOL) tablet 650 mg, 650 mg, Oral, Q6H PRN **OR** acetaminophen (TYLENOL) suppository 650 mg, 650 mg, Rectal, Q6H PRN, Emokpae, Ejiroghene E, MD   [COMPLETED] amiodarone (NEXTERONE) 1.8 mg/mL load via infusion 150 mg, 150 mg, Intravenous, Once, 150 mg at 11/25/22 1800 **FOLLOWED BY** [EXPIRED] amiodarone (NEXTERONE PREMIX) 360-4.14 MG/200ML-% (1.8 mg/mL) IV infusion, 60 mg/hr, Intravenous, Continuous, Stopped at 11/25/22 2331 **FOLLOWED BY** amiodarone (NEXTERONE PREMIX) 360-4.14 MG/200ML-% (1.8 mg/mL) IV infusion, 30 mg/hr, Intravenous, Continuous, Jacalyn Lefevre, MD, Last Rate: 16.67 mL/hr at 11/26/22 0651, 30 mg/hr at 11/26/22 0651   cefTRIAXone (ROCEPHIN) 2 g in sodium chloride 0.9 % 100 mL IVPB, 2 g, Intravenous, Q24H, Emokpae, Ejiroghene E, MD   heparin ADULT infusion 100 units/mL (25000 units/253mL), 1,150 Units/hr, Intravenous, Continuous, Emokpae, Ejiroghene E, MD, Last Rate: 11.5 mL/hr at 11/26/22 0651, 1,150 Units/hr at 11/26/22 0651   insulin aspart (novoLOG) injection 0-9 Units, 0-9 Units, Subcutaneous, Q6H, Emokpae, Ejiroghene E, MD   montelukast (SINGULAIR) tablet 10 mg, 10 mg, Oral, QHS, Emokpae, Ejiroghene E, MD   promethazine (PHENERGAN) tablet 12.5 mg, 12.5 mg, Oral, Q6H PRN, Emokpae, Ejiroghene E, MD  Current Outpatient Medications:    acetaminophen (TYLENOL) 650 MG CR tablet, Take 1,300 mg by mouth every 8 (eight) hours as needed for pain. BID, Disp: , Rfl:    albuterol (PROVENTIL) (2.5 MG/3ML) 0.083% nebulizer solution, Take 3 mLs (2.5 mg total) by nebulization every 4 (four) hours as needed for wheezing or shortness of breath., Disp: 75 mL, Rfl: 2   albuterol (VENTOLIN HFA) 108 (90 Base) MCG/ACT inhaler, Inhale 2 puffs into the lungs every 8 (eight) hours as needed for shortness of breath or wheezing., Disp: 18 g, Rfl:  3   atorvastatin (LIPITOR) 40 MG tablet, Take 1 tablet (40 mg total) by mouth daily., Disp: 30 tablet, Rfl: 11   BYDUREON 2 MG PEN, Inject 2 mg as directed. Every Sunday., Disp: , Rfl: 0   clopidogrel (PLAVIX) 75 MG tablet, Take 1 tablet (75 mg total) by mouth daily. Please take Aspirin 81 mg daily along with Plavix 75 mg daily for 21 days then after that STOP the Aspirin and  continue ONLY Plavix 75 mg daily indefinitely--for  stroke Prevention, Disp: 30 tablet, Rfl: 11   cyclobenzaprine (FLEXERIL) 10 MG tablet, Take 1 tablet (10 mg total) by mouth 2 (two) times daily as needed for muscle spasms., Disp: 20 tablet, Rfl: 0   dicyclomine (BENTYL) 10 MG capsule, Take 1 capsule (10 mg total) by mouth 3 (three) times daily as needed (abdominal cramping or diarrhea. Hold in setting of constipation.)., Disp: 120  capsule, Rfl: 1   famotidine (PEPCID) 20 MG tablet, One after supper (Patient taking differently: Take 20 mg by mouth daily.), Disp: 30 tablet, Rfl: 3   fluticasone (FLONASE) 50 MCG/ACT nasal spray, Place 2 sprays into both nostrils daily., Disp: , Rfl:    gabapentin (NEURONTIN) 300 MG capsule, Take 300 mg by mouth 3 (three) times daily., Disp: , Rfl:    hydrochlorothiazide (HYDRODIURIL) 25 MG tablet, Take 0.5 tablets (12.5 mg total) by mouth daily., Disp: 45 tablet, Rfl: 1   Insulin Glargine (BASAGLAR KWIKPEN) 100 UNIT/ML, Inject 18 Units into the skin at bedtime., Disp: , Rfl:    irbesartan (AVAPRO) 300 MG tablet, Take 1 tablet (300 mg total) by mouth daily., Disp: 90 tablet, Rfl: 0   linaclotide (LINZESS) 290 MCG CAPS capsule, TAKE 1 CAPSULE BY MOUTH ONCE DAILY BEFORE BREAKFAST (Patient taking differently: Take 290 mcg by mouth daily before breakfast.), Disp: 90 capsule, Rfl: 3   meclizine (ANTIVERT) 25 MG tablet, Take 25 mg by mouth 2 (two) times daily as needed for dizziness., Disp: , Rfl:    metFORMIN (GLUCOPHAGE) 500 MG tablet, Take 500 mg by mouth 2 (two) times daily with a meal., Disp: , Rfl:     Metoprolol Tartrate 75 MG TABS, Take 75 mg by mouth 3 (three) times daily., Disp: , Rfl: 6   montelukast (SINGULAIR) 10 MG tablet, Take 10 mg by mouth at bedtime., Disp: , Rfl:    nitroGLYCERIN (NITROSTAT) 0.4 MG SL tablet, Place 1 tablet (0.4 mg total) under the tongue every 5 (five) minutes x 3 doses as needed for chest pain., Disp: 25 tablet, Rfl: 2   pantoprazole (PROTONIX) 40 MG tablet, Take 40 mg by mouth daily., Disp: , Rfl: 3   potassium chloride (KLOR-CON) 10 MEQ tablet, Take 1 tablet (10 mEq total) by mouth daily. Take While taking HCTZ (Patient taking differently: Take 20 mEq by mouth daily. Take While taking HCTZ), Disp: 30 tablet, Rfl: 0   sodium chloride (OCEAN) 0.65 % SOLN nasal spray, Place 1 spray into both nostrils as needed for congestion., Disp: , Rfl:    VASCEPA 1 g capsule, Take 2 g by mouth 2 (two) times daily., Disp: , Rfl:    Insulin Pen Needle 31G X 5 MM MISC, by Does not apply route., Disp: , Rfl:    OVER THE COUNTER MEDICATION, One touch Verio Reflect glucose machine.  One touch Verio Reflect glucose test strips 1-2 daily  One touch delica plus Lancets one lancet to check glucose bid., Disp: , Rfl:   insulin aspart  0-9 Units Subcutaneous Q6H   montelukast  10 mg Oral QHS    sodium chloride 100 mL/hr at 11/25/22 2333   amiodarone 30 mg/hr (11/26/22 0651)   cefTRIAXone (ROCEPHIN)  IV     heparin 1,150 Units/hr (11/26/22 4098)    Physical Exam: Blood pressure 102/79, pulse (!) 127, temperature 98.2 F (36.8 C), resp. rate (!) 26, height 5\' 3"  (1.6 m), weight 114.8 kg, SpO2 95%.    Affect appropriate Obese poor dentition  HEENT: normal Neck supple with no adenopathy JVP normal no bruits no thyromegaly Lungs clear with no wheezing and good diaphragmatic motion Heart:  S1/S2 no murmur, no rub, gallop or click PMI normal Abdomen: benighn, BS positve, no tenderness, no AAA no bruit.  No HSM or HJR Distal pulses intact with no bruits No edema Neuro  non-focal Skin warm and dry No muscular weakness   Labs:   Lab Results  Component  Value Date   WBC 14.0 (H) 11/26/2022   HGB 10.8 (L) 11/26/2022   HCT 33.4 (L) 11/26/2022   MCV 84.1 11/26/2022   PLT 461 (H) 11/26/2022    Recent Labs  Lab 11/25/22 1317 11/26/22 0204  NA 130* 136  K 3.5 3.8  CL 99 105  CO2 16* 16*  BUN 79* 58*  CREATININE 3.45* 2.58*  CALCIUM 8.3* 8.1*  PROT 7.6  --   BILITOT 0.6  --   ALKPHOS 119  --   ALT 52*  --   AST 80*  --   GLUCOSE 154* 127*   Lab Results  Component Value Date   CKTOTAL 46 03/27/2010   CKMB 1.8 03/27/2010   TROPONINI <0.03 04/06/2018    Lab Results  Component Value Date   CHOL 131 08/10/2022   CHOL 165 04/06/2018   Lab Results  Component Value Date   HDL 41 08/10/2022   HDL 44 04/06/2018   Lab Results  Component Value Date   LDLCALC 59 08/10/2022   LDLCALC 54 04/06/2018   Lab Results  Component Value Date   TRIG 157 (H) 08/10/2022   TRIG 333 (H) 04/06/2018   Lab Results  Component Value Date   CHOLHDL 3.2 08/10/2022   CHOLHDL 3.8 04/06/2018   No results found for: "LDLDIRECT"    Radiology: CT ABDOMEN PELVIS WO CONTRAST  Result Date: 11/25/2022 CLINICAL DATA:  Weakness with fall onto right side 1 day prior to presentation. Recent vomiting and diarrhea. EXAM: CT ABDOMEN AND PELVIS WITHOUT CONTRAST TECHNIQUE: Multidetector CT imaging of the abdomen and pelvis was performed following the standard protocol without IV contrast. RADIATION DOSE REDUCTION: This exam was performed according to the departmental dose-optimization program which includes automated exposure control, adjustment of the mA and/or kV according to patient size and/or use of iterative reconstruction technique. COMPARISON:  CT abdomen and pelvis dated 06/01/2004, CTA chest dated 01/13/2008 FINDINGS: Lower chest: Medial right middle lobe 10 x 4 mm nodule (3:19), not substantially changed from 2009. No specific follow-up imaging recommended. No  pleural effusion or pneumothorax demonstrated. Partially imaged heart size is normal. Coronary artery calcifications. Hepatobiliary: No focal hepatic lesions. No intra or extrahepatic biliary ductal dilation. Cholecystectomy. Pancreas: No focal lesions or main ductal dilation. Spleen: Normal in size without focal abnormality. Adrenals/Urinary Tract: No adrenal nodules. No suspicious renal mass on this noncontrast enhanced examination or calculi. Mild bilateral hydroureteronephrosis to the level of the distended urinary bladder. Markedly distended urinary bladder. No mural thickening. Stomach/Bowel: Normal appearance of the stomach. No evidence of bowel wall thickening, distention, or inflammatory changes. Appendix is not discretely seen. Vascular/Lymphatic: Aortic atherosclerosis. No enlarged abdominal or pelvic lymph nodes. Reproductive: No adnexal masses. Other: No free fluid, fluid collection, or free air. Musculoskeletal: No acute or abnormal lytic or blastic osseous lesions. Multilevel degenerative changes of the partially imaged thoracic and lumbar spine. IMPRESSION: 1. Markedly distended urinary bladder with mild bilateral hydroureteronephrosis. Recommend correlation with urinary retention. 2. No other acute abdominopelvic findings. 3. Aortic Atherosclerosis (ICD10-I70.0). Coronary artery calcifications. Assessment for potential risk factor modification, dietary therapy or pharmacologic therapy may be warranted, if clinically indicated. Electronically Signed   By: Agustin Cree M.D.   On: 11/25/2022 18:49   DG Chest Port 1 View  Result Date: 11/25/2022 CLINICAL DATA:  Palpitations EXAM: PORTABLE CHEST 1 VIEW COMPARISON:  X-ray 05/07/2021 FINDINGS: Underinflation. There is some linear opacity at the bases likely scar or atelectasis. No consolidation, pneumothorax or effusion. No edema. Calcified  aorta. Overlapping cardiac leads. Degenerative changes of the spine. IMPRESSION: Underinflation with some basilar  atelectasis. Electronically Signed   By: Karen Kays M.D.   On: 11/25/2022 15:40    EKG: afib no acute ST changes    ASSESSMENT AND PLAN:   Afib:  history of CHADVASC 6 continue amiodarone and heparin. Ok to use dig. Check echo will add back low dose lopressor as BP ok She was on 75 mg tid at home.  Sepsis:  ? From urinary tract system with bladder distension and hydronephrosis Cr better with hydration 3.45 to 2.58. continue Rocephin   Signed: Charlton Haws 11/26/2022, 7:38 AM

## 2022-11-26 NOTE — Consult Note (Signed)
PHARMACY - ANTICOAGULATION CONSULT NOTE  Pharmacy Consult for heparin infusion Indication: atrial fibrillation  Allergies  Allergen Reactions   Ciprofloxacin Diarrhea and Nausea And Vomiting    stomach pain   Latex Itching   Sulfa Antibiotics Itching and Rash   Patient Measurements: Height: 5\' 3"  (160 cm) Weight: 114.8 kg (253 lb) IBW/kg (Calculated) : 52.4 Heparin Dosing Weight: 80.3 kg  Vital Signs: Temp: 99.6 F (37.6 C) (10/03 2321) Temp Source: Rectal (10/03 2321) BP: 114/82 (10/04 0200) Pulse Rate: 138 (10/04 0211)  Labs: Recent Labs    11/25/22 1317 11/25/22 1453 11/25/22 2337 11/26/22 0204  HGB 11.8*  --   --   --   HCT 36.1  --   --   --   PLT 489*  --   --   --   APTT  --  27  --   --   LABPROT  --  14.9  --   --   INR  --  1.2  --   --   HEPARINUNFRC  --   --   --  0.31  CREATININE 3.45*  --   --  2.58*  TROPONINIHS  --   --  10 10    Estimated Creatinine Clearance: 24.8 mL/min (A) (by C-G formula based on SCr of 2.58 mg/dL (H)).   Medical History: Past Medical History:  Diagnosis Date   Bipolar 1 disorder (HCC)    Cancer (HCC)    Diabetes mellitus without complication (HCC)    Hypertension    PONV (postoperative nausea and vomiting)    Stroke [facility])     Assessment: 72 yo female presented to ED with abdominal discomfort and diarrhea.  EKG in ED showed patient to be in Afib.  Pharmacy consulted for initiation of heparin infusion.  10/4 AM update:  Heparin level therapeutic   Goal of Therapy:  Heparin level 0.3-0.7 units/ml Monitor platelets by anticoagulation protocol: Yes   Plan:  Cont heparin 1150 units/hr Heparin level in 8 hours  Abran Duke, PharmD, BCPS Clinical Pharmacist Phone: [phone]

## 2022-11-26 NOTE — TOC Benefit Eligibility Note (Signed)

## 2022-11-26 NOTE — ED Notes (Addendum)
ED TO INPATIENT HANDOFF REPORT  ED Nurse Name and Phone #: Haze Justin [phone]  S Name/Age/Gender [name] 72 y.o. female Room/Bed: APA10/APA10  Code Status   Code Status: Full Code  Home/SNF/Other Home Patient oriented to: self, place, time, and situation Is this baseline? Yes   Triage Complete: Triage complete  Chief Complaint Atrial fibrillation with RVR (HCC) [I48.91]  Triage Note Pt brought in by ems for weakness.  Pt states she was vomiting last week for 3 days and had diarrhea but stopped yesterday; pt states she fell yesterday landing on her right side due to feeling weak  Pt also c/o chest tightness and right knee pain and sob   Allergies Allergies  Allergen Reactions   Ciprofloxacin Diarrhea and Nausea And Vomiting    stomach pain   Latex Itching   Sulfa Antibiotics Itching and Rash    Level of Care/Admitting Diagnosis ED Disposition     ED Disposition  Admit   Condition  --   Comment  Hospital Area: [facility] [[id number]]  Level of Care: Stepdown [14]  Covid Evaluation: Asymptomatic - no recent exposure (last 10 days) testing not required  Diagnosis: Atrial fibrillation with RVR [facility]) [[id number]]  Admitting Physician: Onnie Boer [[id number]]  Attending Physician: Onnie Boer [phone]  Certification:: I certify this patient will need inpatient services for at least 2 midnights  Expected Medical Readiness: 11/28/2022          B Medical/Surgery History Past Medical History:  Diagnosis Date   Bipolar 1 disorder (HCC)    Cancer (HCC)    Diabetes mellitus without complication (HCC)    Hypertension    PONV (postoperative nausea and vomiting)    Stroke [facility])    Past Surgical History:  Procedure Laterality Date   ABDOMINAL HYSTERECTOMY     CHOLECYSTECTOMY     COLONOSCOPY WITH PROPOFOL N/A 10/06/2020   Surgeon: Earnest Bailey K, DO; nonbleeding internal hemorrhoids, 5 mm tubular adenoma removed, 3 mm hyperplastic  polyp removed, otherwise normal exam.  Recommended 5-year surveillance.   ORIF FEMUR FRACTURE Left 11/30/2016   Procedure: OPEN REDUCTION INTERNAL FIXATION (ORIF) DISTAL FEMUR FRACTURE;  Surgeon: Samson Frederic, MD;  Location: [facility] OR;  Service: Orthopedics;  Laterality: Left;   POLYPECTOMY  10/06/2020   Procedure: POLYPECTOMY INTESTINAL;  Surgeon: Lanelle Bal, DO;  Location: [facility] ENDO SUITE;  Service: Endoscopy;;   stroke     TONSILLECTOMY       A IV Location/Drains/Wounds Patient Lines/Drains/Airways Status     Active Line/Drains/Airways     Name Placement date Placement time Site Days   Peripheral IV 11/25/22 20 G Anterior;Proximal;Right Antecubital 11/25/22  1330  Antecubital  1   Peripheral IV 11/25/22 22 G Left Antecubital 11/25/22  1500  Antecubital  1   Urethral Catheter JFoster Straight-tip 14 Fr. 11/25/22  2333  Straight-tip  1            Intake/Output Last 24 hours  Intake/Output Summary (Last 24 hours) at 11/26/2022 1109 Last data filed at 11/26/2022 0624 Gross per 24 hour  Intake 5036 ml  Output 3150 ml  Net 1886 ml    Labs/Imaging Results for orders placed or performed during the hospital encounter of 11/25/22 (from the past 48 hour(s))  CBC with Differential/Platelet     Status: Abnormal   Collection Time: 11/25/22  1:17 PM  Result Value Ref Range   WBC 14.3 (H) 4.0 - 10.5 K/uL   RBC 4.37 3.87 - 5.11  MIL/uL   Hemoglobin 11.8 (L) 12.0 - 15.0 g/dL   HCT 40.9 81.1 - 91.4 %   MCV 82.6 80.0 - 100.0 fL   MCH 27.0 26.0 - 34.0 pg   MCHC 32.7 30.0 - 36.0 g/dL   RDW 78.2 95.6 - 21.3 %   Platelets 489 (H) 150 - 400 K/uL   nRBC 0.0 0.0 - 0.2 %   Neutrophils Relative % 71 %   Neutro Abs 10.3 (H) 1.7 - 7.7 K/uL   Band Neutrophils 1 %   Lymphocytes Relative 19 %   Lymphs Abs 2.7 0.7 - 4.0 K/uL   Monocytes Relative 7 %   Monocytes Absolute 1.0 0.1 - 1.0 K/uL   Eosinophils Relative 0 %   Eosinophils Absolute 0.0 0.0 - 0.5 K/uL   Basophils Relative 0 %    Basophils Absolute 0.0 0.0 - 0.1 K/uL   WBC Morphology Mild Left Shift (1-5% metas, occ myelo)     Comment: TOXIC GRANULATION   RBC Morphology MORPHOLOGY UNREMARKABLE    Smear Review See Note     Comment: FEW LARGE PLATELETS   Metamyelocytes Relative 2 %   Abs Immature Granulocytes 0.30 (H) 0.00 - 0.07 K/uL   Reactive, Benign Lymphocytes PRESENT     Comment: Performed at [facility], [address], [place], [place] [address]  Comprehensive metabolic panel     Status: Abnormal   Collection Time: 11/25/22  1:17 PM  Result Value Ref Range   Sodium 130 (L) 135 - 145 mmol/L   Potassium 3.5 3.5 - 5.1 mmol/L   Chloride 99 98 - 111 mmol/L   CO2 16 (L) 22 - 32 mmol/L   Glucose, Bld 154 (H) 70 - 99 mg/dL    Comment: Glucose reference range applies only to samples taken after fasting for at least 8 hours.   BUN 79 (H) 8 - 23 mg/dL   Creatinine, Ser 8.46 (H) 0.44 - 1.00 mg/dL   Calcium 8.3 (L) 8.9 - 10.3 mg/dL   Total Protein 7.6 6.5 - 8.1 g/dL   Albumin 2.8 (L) 3.5 - 5.0 g/dL   AST 80 (H) 15 - 41 U/L   ALT 52 (H) 0 - 44 U/L   Alkaline Phosphatase 119 38 - 126 U/L   Total Bilirubin 0.6 0.3 - 1.2 mg/dL   GFR, Estimated 14 (L) >60 mL/min    Comment: (NOTE) Calculated using the CKD-EPI Creatinine Equation (2021)    Anion gap 15 5 - 15    Comment: Performed at [facility], [address], [place], [place] [address]  Lactic acid, plasma     Status: None   Collection Time: 11/25/22  2:53 PM  Result Value Ref Range   Lactic Acid, Venous 1.4 0.5 - 1.9 mmol/L    Comment: Performed at [facility], [address], [place], [place] [address]  Protime-INR     Status: None   Collection Time: 11/25/22  2:53 PM  Result Value Ref Range   Prothrombin Time 14.9 11.4 - 15.2 seconds   INR 1.2 0.8 - 1.2    Comment: (NOTE) INR goal varies based on device and disease states. Performed at [facility], [address] [place], [place] [address]   APTT     Status: None   Collection Time: 11/25/22  2:53 PM   Result Value Ref Range   aPTT 27 24 - 36 seconds    Comment: Performed at [facility], [address], [place], [place] [address]  Blood Culture (  routine x 2)     Status: None (Preliminary result)   Collection Time: 11/25/22  2:53 PM   Specimen: BLOOD  Result Value Ref Range   Specimen Description BLOOD BLOOD LEFT ARM    Special Requests      BOTTLES DRAWN AEROBIC AND ANAEROBIC Blood Culture results may not be optimal due to an excessive volume of blood received in culture bottles   Culture      NO GROWTH < 24 HOURS Performed at [facility], [address], [place], [place] [address]    Report Status PENDING   Blood Culture (routine x 2)     Status: None (Preliminary result)   Collection Time: 11/25/22  2:55 PM   Specimen: BLOOD RIGHT ARM  Result Value Ref Range   Specimen Description      BLOOD RIGHT ARM Performed at [facility], [address], [place], [place] [address]    Special Requests      BOTTLES DRAWN AEROBIC AND ANAEROBIC Blood Culture adequate volume Performed at [facility], [address] [place], [place] [address]    Culture  Setup Time      GRAM NEGATIVE RODS ANAEROBIC BOTTLE ONLY CRITICAL RESULT CALLED TO, READ BACK BY AND VERIFIED WITH: KELLY GIBBS [id number] BY VIRAY,J Organism ID to follow Performed at [facility], [address] [place], [place] [address]    Culture GRAM NEGATIVE RODS    Report Status PENDING   Urinalysis, w/ Reflex to Culture (Infection Suspected) -Urine, Clean Catch     Status: Abnormal   Collection Time: 11/25/22  4:25 PM  Result Value Ref Range   Specimen Source URINE, CLEAN CATCH    Color, Urine YELLOW YELLOW   APPearance HAZY (A) CLEAR   Specific Gravity, Urine 1.010 1.005 - 1.030   pH 5.0 5.0 - 8.0   Glucose, UA NEGATIVE NEGATIVE mg/dL   Hgb urine dipstick MODERATE (A) NEGATIVE   Bilirubin Urine NEGATIVE NEGATIVE   Ketones, ur NEGATIVE NEGATIVE mg/dL   Protein, ur NEGATIVE NEGATIVE mg/dL   Nitrite  POSITIVE (A) NEGATIVE   Leukocytes,Ua MODERATE (A) NEGATIVE   RBC / HPF 0-5 0 - 5 RBC/hpf   WBC, UA 11-20 0 - 5 WBC/hpf    Comment:        Reflex urine culture not performed if WBC <=10, OR if Squamous epithelial cells >5. If Squamous epithelial cells >5 suggest recollection.    Bacteria, UA MANY (A) NONE SEEN   Squamous Epithelial / HPF 0-5 0 - 5 /HPF   WBC Clumps PRESENT     Comment: Performed at [facility], [address], [place], [place] [address]  Lactic acid, plasma     Status: Abnormal   Collection Time: 11/25/22  4:33 PM  Result Value Ref Range   Lactic Acid, Venous 2.0 (HH) 0.5 - 1.9 mmol/L    Comment: CRITICAL RESULT CALLED TO, READ BACK BY AND VERIFIED WITH BELCHER,J ON 11/25/22 AT 1725 BY LOY,C Performed at [facility], [address] [place], [place] [address]   Resp panel by RT-PCR (RSV, Flu A&B, Covid) Anterior Nasal Swab     Status: None   Collection Time: 11/25/22  5:10 PM   Specimen: Anterior Nasal Swab  Result Value Ref Range   SARS Coronavirus 2 by RT PCR NEGATIVE NEGATIVE    Comment: (NOTE) SARS-CoV-2 target nucleic acids are NOT DETECTED.  The SARS-CoV-2 RNA is generally detectable in upper respiratory specimens during the acute phase  of infection. The lowest concentration of SARS-CoV-2 viral copies this assay can detect is 138 copies/mL. A negative result does not preclude SARS-Cov-2 infection and should not be used as the sole basis for treatment or other patient management decisions. A negative result may occur with  improper specimen collection/handling, submission of specimen other than nasopharyngeal swab, presence of viral mutation(s) within the areas targeted by this assay, and inadequate number of viral copies(<138 copies/mL). A negative result must be combined with clinical observations, patient history, and epidemiological information. The expected result is Negative.  Fact Sheet for Patients:   BloggerCourse.com  Fact Sheet for Healthcare Providers:  SeriousBroker.it  This test is no t yet approved or cleared by the [place] FDA and  has been authorized for detection and/or diagnosis of SARS-CoV-2 by FDA under an Emergency Use Authorization (EUA). This EUA will remain  in effect (meaning this test can be used) for the duration of the COVID-19 declaration under Section 564(b)(1) of the Act, 21 U.S.C.section 360bbb-3(b)(1), unless the authorization is terminated  or revoked sooner.       Influenza A by PCR NEGATIVE NEGATIVE   Influenza B by PCR NEGATIVE NEGATIVE    Comment: (NOTE) The Xpert Xpress SARS-CoV-2/FLU/RSV plus assay is intended as an aid in the diagnosis of influenza from Nasopharyngeal swab specimens and should not be used as a sole basis for treatment. Nasal washings and aspirates are unacceptable for Xpert Xpress SARS-CoV-2/FLU/RSV testing.  Fact Sheet for Patients: BloggerCourse.com  Fact Sheet for Healthcare Providers: SeriousBroker.it  This test is not yet approved or cleared by the [place] FDA and has been authorized for detection and/or diagnosis of SARS-CoV-2 by FDA under an Emergency Use Authorization (EUA). This EUA will remain in effect (meaning this test can be used) for the duration of the COVID-19 declaration under Section 564(b)(1) of the Act, 21 U.S.C. section 360bbb-3(b)(1), unless the authorization is terminated or revoked.     Resp Syncytial Virus by PCR NEGATIVE NEGATIVE    Comment: (NOTE) Fact Sheet for Patients: BloggerCourse.com  Fact Sheet for Healthcare Providers: SeriousBroker.it  This test is not yet approved or cleared by the [place] FDA and has been authorized for detection and/or diagnosis of SARS-CoV-2 by FDA under an Emergency Use Authorization (EUA).  This EUA will remain in effect (meaning this test can be used) for the duration of the COVID-19 declaration under Section 564(b)(1) of the Act, 21 U.S.C. section 360bbb-3(b)(1), unless the authorization is terminated or revoked.  Performed at [facility], [address] [place], [place] [address]   Troponin I (High Sensitivity)     Status: None   Collection Time: 11/25/22 11:37 PM  Result Value Ref Range   Troponin I (High Sensitivity) 10 <18 ng/L    Comment: (NOTE) Elevated high sensitivity troponin I (hsTnI) values and significant  changes across serial measurements may suggest ACS but many other  chronic and acute conditions are known to elevate hsTnI results.  Refer to the "Links" section for chest pain algorithms and additional  guidance. Performed at [facility], [address] [place], [place] [address]   TSH     Status: Abnormal   Collection Time: 11/25/22 11:37 PM  Result Value Ref Range   TSH 0.277 (L) 0.350 - 4.500 uIU/mL    Comment: Performed by a 3rd Generation assay with a functional sensitivity of <=0.01 uIU/mL. Performed at [facility], [address] [place], [place] [address]   Magnesium     Status: None  Collection Time: 11/25/22 11:37 PM  Result Value Ref Range   Magnesium 1.7 1.7 - 2.4 mg/dL    Comment: Performed at [facility], [address], [place], [place] [address]  Lactic acid, plasma     Status: None   Collection Time: 11/25/22 11:37 PM  Result Value Ref Range   Lactic Acid, Venous 1.3 0.5 - 1.9 mmol/L    Comment: Performed at [facility], [address] [place], [place] [address]  CBG monitoring, ED     Status: Abnormal   Collection Time: 11/26/22  1:16 AM  Result Value Ref Range   Glucose-Capillary 120 (H) 70 - 99 mg/dL    Comment: Glucose reference range applies only to samples taken after fasting for at least 8 hours.  Heparin level (unfractionated)     Status: None   Collection Time: 11/26/22  2:04 AM  Result Value Ref Range    Heparin Unfractionated 0.31 0.30 - 0.70 IU/mL    Comment: (NOTE) The clinical reportable range upper limit is being lowered to >1.10 to align with the FDA approved guidance for the current laboratory assay.  If heparin results are below expected values, and patient dosage has  been confirmed, suggest follow up testing of antithrombin III levels. Performed at [facility], [address] [place], [place] [address]   Troponin I (High Sensitivity)     Status: None   Collection Time: 11/26/22  2:04 AM  Result Value Ref Range   Troponin I (High Sensitivity) 10 <18 ng/L    Comment: (NOTE) Elevated high sensitivity troponin I (hsTnI) values and significant  changes across serial measurements may suggest ACS but many other  chronic and acute conditions are known to elevate hsTnI results.  Refer to the "Links" section for chest pain algorithms and additional  guidance. Performed at [facility], [address], [place], [place] [address]   Basic metabolic panel     Status: Abnormal   Collection Time: 11/26/22  2:04 AM  Result Value Ref Range   Sodium 136 135 - 145 mmol/L   Potassium 3.8 3.5 - 5.1 mmol/L   Chloride 105 98 - 111 mmol/L   CO2 16 (L) 22 - 32 mmol/L   Glucose, Bld 127 (H) 70 - 99 mg/dL    Comment: Glucose reference range applies only to samples taken after fasting for at least 8 hours.   BUN 58 (H) 8 - 23 mg/dL   Creatinine, Ser 5.78 (H) 0.44 - 1.00 mg/dL   Calcium 8.1 (L) 8.9 - 10.3 mg/dL   GFR, Estimated 19 (L) >60 mL/min    Comment: (NOTE) Calculated using the CKD-EPI Creatinine Equation (2021)    Anion gap 15 5 - 15    Comment: Performed at [facility], [address], [place], [place] [address]  CBC     Status: Abnormal   Collection Time: 11/26/22  5:29 AM  Result Value Ref Range   WBC 14.0 (H) 4.0 - 10.5 K/uL   RBC 3.97 3.87 - 5.11 MIL/uL   Hemoglobin 10.8 (L) 12.0 - 15.0 g/dL   HCT 95.2 (L) 84.1 - 32.4 %   MCV 84.1 80.0 - 100.0 fL   MCH 27.2 26.0 - 34.0 pg    MCHC 32.3 30.0 - 36.0 g/dL   RDW 40.1 02.7 - 25.3 %   Platelets 461 (H) 150 - 400 K/uL   nRBC 0.0 0.0 - 0.2 %    Comment: Performed at [facility], [address] [place], [place] [address]  CBG monitoring, ED     Status: Abnormal   Collection Time: 11/26/22  6:20 AM  Result Value Ref Range   Glucose-Capillary 103 (H) 70 - 99 mg/dL    Comment: Glucose reference range applies only to samples taken after fasting for at least 8 hours.  Heparin level (unfractionated)     Status: None   Collection Time: 11/26/22  7:51 AM  Result Value Ref Range   Heparin Unfractionated 0.33 0.30 - 0.70 IU/mL    Comment: (NOTE) The clinical reportable range upper limit is being lowered to >1.10 to align with the FDA approved guidance for the current laboratory assay.  If heparin results are below expected values, and patient dosage has  been confirmed, suggest follow up testing of antithrombin III levels. Performed at [facility], [address], [place], [place] [address]    CT ABDOMEN PELVIS WO CONTRAST  Result Date: 11/25/2022 CLINICAL DATA:  Weakness with fall onto right side 1 day prior to presentation. Recent vomiting and diarrhea. EXAM: CT ABDOMEN AND PELVIS WITHOUT CONTRAST TECHNIQUE: Multidetector CT imaging of the abdomen and pelvis was performed following the standard protocol without IV contrast. RADIATION DOSE REDUCTION: This exam was performed according to the departmental dose-optimization program which includes automated exposure control, adjustment of the mA and/or kV according to patient size and/or use of iterative reconstruction technique. COMPARISON:  CT abdomen and pelvis dated 06/01/2004, CTA chest dated 01/13/2008 FINDINGS: Lower chest: Medial right middle lobe 10 x 4 mm nodule (3:19), not substantially changed from 2009. No specific follow-up imaging recommended. No pleural effusion or pneumothorax demonstrated. Partially imaged heart size is normal. Coronary artery calcifications.  Hepatobiliary: No focal hepatic lesions. No intra or extrahepatic biliary ductal dilation. Cholecystectomy. Pancreas: No focal lesions or main ductal dilation. Spleen: Normal in size without focal abnormality. Adrenals/Urinary Tract: No adrenal nodules. No suspicious renal mass on this noncontrast enhanced examination or calculi. Mild bilateral hydroureteronephrosis to the level of the distended urinary bladder. Markedly distended urinary bladder. No mural thickening. Stomach/Bowel: Normal appearance of the stomach. No evidence of bowel wall thickening, distention, or inflammatory changes. Appendix is not discretely seen. Vascular/Lymphatic: Aortic atherosclerosis. No enlarged abdominal or pelvic lymph nodes. Reproductive: No adnexal masses. Other: No free fluid, fluid collection, or free air. Musculoskeletal: No acute or abnormal lytic or blastic osseous lesions. Multilevel degenerative changes of the partially imaged thoracic and lumbar spine. IMPRESSION: 1. Markedly distended urinary bladder with mild bilateral hydroureteronephrosis. Recommend correlation with urinary retention. 2. No other acute abdominopelvic findings. 3. Aortic Atherosclerosis (ICD10-I70.0). Coronary artery calcifications. Assessment for potential risk factor modification, dietary therapy or pharmacologic therapy may be warranted, if clinically indicated. Electronically Signed   By: Agustin Cree M.D.   On: 11/25/2022 18:49   DG Chest Port 1 View  Result Date: 11/25/2022 CLINICAL DATA:  Palpitations EXAM: PORTABLE CHEST 1 VIEW COMPARISON:  X-ray 05/07/2021 FINDINGS: Underinflation. There is some linear opacity at the bases likely scar or atelectasis. No consolidation, pneumothorax or effusion. No edema. Calcified aorta. Overlapping cardiac leads. Degenerative changes of the spine. IMPRESSION: Underinflation with some basilar atelectasis. Electronically Signed   By: Karen Kays M.D.   On: 11/25/2022 15:40    Pending Labs Unresulted Labs  (From admission, onward)     Start     Ordered   11/27/22 0500  Heparin level (unfractionated)  Daily,   R      11/26/22 0810   11/27/22 0500  CBC  Daily,   R  11/26/22 0810   11/26/22 0718  T4, free  Once,   R        11/26/22 0717   11/25/22 1625  Urine Culture  Once,   R        11/25/22 1625   11/25/22 1530  C Difficile Quick Screen w PCR reflex  (C Difficile quick screen w PCR reflex panel )  Once, for 24 hours,   URGENT       References:    CDiff Information Tool   11/25/22 1529   11/25/22 1530  Gastrointestinal Panel by PCR , Stool  (Gastrointestinal Panel by PCR, Stool                                                                                                                                                     **Does Not include CLOSTRIDIUM DIFFICILE testing. **If CDIFF testing is needed, place order from the "C Difficile Testing" order set.**)  Once,   URGENT        11/25/22 1529   11/25/22 1455  Blood Culture ID Panel (Reflexed)  Once,   STAT        11/25/22 1455            Vitals/Pain Today's Vitals   11/26/22 1015 11/26/22 1030 11/26/22 1045 11/26/22 1100  BP:   107/77 123/64  Pulse: (!) 130 (!) 111 (!) 125 (!) 121  Resp: (!) 24 (!) 23 18 (!) 22  Temp:      TempSrc:      SpO2: 96% 96% 94% 93%  Weight:      Height:      PainSc:        Isolation Precautions No active isolations  Medications Medications  amiodarone (NEXTERONE) 1.8 mg/mL load via infusion 150 mg (150 mg Intravenous Bolus from Bag 11/25/22 1800)    Followed by  amiodarone (NEXTERONE PREMIX) 360-4.14 MG/200ML-% (1.8 mg/mL) IV infusion (0 mg/hr Intravenous Stopped 11/25/22 2331)    Followed by  amiodarone (NEXTERONE PREMIX) 360-4.14 MG/200ML-% (1.8 mg/mL) IV infusion (30 mg/hr Intravenous Rate/Dose Verify 11/26/22 0847)  heparin ADULT infusion 100 units/mL (25000 units/268mL) (1,150 Units/hr Intravenous Rate/Dose Verify 11/26/22 0847)  0.9 %  sodium chloride infusion ( Intravenous Infusion  Verify 11/26/22 0943)  montelukast (SINGULAIR) tablet 10 mg (10 mg Oral Not Given 11/26/22 0118)  insulin aspart (novoLOG) injection 0-9 Units ( Subcutaneous Not Given 11/26/22 0648)  acetaminophen (TYLENOL) tablet 650 mg (650 mg Oral Given 11/26/22 0855)    Or  acetaminophen (TYLENOL) suppository 650 mg ( Rectal See Alternative 11/26/22 0855)  promethazine (PHENERGAN) tablet 12.5 mg (has no administration in time range)  cefTRIAXone (ROCEPHIN) 2 g in sodium chloride 0.9 % 100 mL IVPB (has no administration in time range)  metoprolol tartrate (LOPRESSOR) tablet 12.5 mg (12.5 mg Oral Given 11/26/22 0856)  lactated  ringers bolus 1,000 mL (0 mLs Intravenous Stopped 11/25/22 1552)    And  lactated ringers bolus 1,000 mL (0 mLs Intravenous Stopped 11/25/22 1612)    And  lactated ringers bolus 1,000 mL (0 mLs Intravenous Stopped 11/25/22 1702)    And  lactated ringers bolus 500 mL (0 mLs Intravenous Stopped 11/25/22 1730)  ceFEPIme (MAXIPIME) 2 g in sodium chloride 0.9 % 100 mL IVPB (0 g Intravenous Stopped 11/25/22 1601)  metroNIDAZOLE (FLAGYL) IVPB 500 mg (0 mg Intravenous Stopped 11/25/22 1620)  vancomycin (VANCOREADY) IVPB 2000 mg/400 mL (0 mg Intravenous Stopped 11/25/22 1732)  heparin bolus via infusion 4,000 Units (4,000 Units Intravenous Bolus from Bag 11/25/22 1818)  sodium chloride 0.9 % bolus 500 mL (500 mLs Intravenous Bolus 11/25/22 2119)  digoxin (LANOXIN) 0.25 MG/ML injection 500 mcg (500 mcg Intravenous Given 11/25/22 2240)  HYDROcodone-acetaminophen (NORCO/VICODIN) 5-325 MG per tablet 1 tablet (1 tablet Oral Given 11/25/22 2335)  potassium chloride SA (KLOR-CON M) CR tablet 40 mEq (40 mEq Oral Given 11/25/22 2335)    Mobility walks with device       R Recommendations: See Admitting Provider Note  Report given to: Victorino Dike

## 2022-11-26 NOTE — Progress Notes (Signed)
PROGRESS NOTE    [name]  UEA:[id number] DOB: 12/12/1950 DOA: 11/25/2022 PCP: [facility] Ort [facility]   Brief Narrative:    [name] is a 72 y.o. female with medical history significant for hypertension, diabetes mellitus, bipolar disorder, stroke, morbid obesity. Patient presented to the ED with complaints of multiple episodes/continuous diarrhea that started yesterday till this morning with associated abdominal pain.  She reports about 3 days of vomiting that has now resolved but this occurred about 2 days prior to onset of diarrhea.   She reports palpitations also, with generalized weakness.  Patient was admitted with atrial fibrillation with RVR in the setting of severe sepsis with UTI and diarrhea.  She is also noted to have AKI along with acute urinary retention.  Now with some E. coli bacteremia noted.  Assessment & Plan:   Principal Problem:   Atrial fibrillation with RVR (HCC) Active Problems:   Severe sepsis (HCC)   UTI (urinary tract infection)   Acute urinary retention   AKI (acute kidney injury) (HCC)   Prolonged QT interval   Diarrhea   Morbid obesity (HCC)   Essential hypertension   Bipolar 1 disorder (HCC)   Diabetes mellitus without complication (HCC)   Stroke (HCC)  Assessment and Plan:  Atrial fibrillation with RVR (HCC) Persistently elevated heart rate 138- 152, despite amiodarone bolus and drip, over 3 hours ago.  No history of atrial fibrillation. CHAd2Vasc score- at least 6, for age,sex, stroke diabetes and hypertensive history.   Atria fibrillation likely driven by severe sepsis. -Continue heparin and amiodarone drip started in ED -I talked to cardiology Fellow on call, Dr. Ludger Nutting continuing amiodarone drip started in ED, recommended adding 1 dose of 500 mcg IV Digoxin for now.  Heart rate should improve as sepsis physiology resolves.  If persistent tachycardia, give 250 mcg of digoxin in 6 hours.  -Trend troponin -TSH  0.277, free T4 0.9 -Appreciate cardiology evaluation with resumption of home metoprolol -Echocardiogram   AKI (acute kidney injury) (HCC)-improving Creatinine 3.45, markedly elevated from baseline ~1.  AKI in the setting of severe sepsis,dehydration from GI losses, on ARB's- irbesartan, and HCTZ, also possibly some post-renal component considering mild bilateral hydroureteronephros on CT. - Hydrate  - Foley -Hold irbesartan and hydrochlorothiazide -Continue to monitor in a.m.   Acute urinary retention CT abdomen and pelvis showing- Markedly distended urinary bladder with mild bilateral hydroureteronephrosis.  Patient was able to void large amount in the ED.  Subsequently, Bladder scan in the ED, showed 1.2 L of urine. -Insert Foley catheter   Severe sepsis (HCC) Severe sepsis secondary to UTI with E. coli bacteremia.  Meeting sepsis criteria with tachycardia heart rate 138 -152, tachypnea respiratory 23-34, evidence of endorgan dysfunction, kidney injury and lactic acidosis 1.4 > 2.  Possibly some gastroenteritis/Diarrhea also with resultant dehydration..  CT abdomen and pelvis -suggesting urinary retention otherwise negative for other acute abdominal pelvic findings. -Follow-up Urine cultures -Blood cultures with 1/4 bottles with E. coli -cefepime and metronidazole started in the ED, will continue with IV ceftriaxone - Additional 500 mL bolus given, totaling 4 L bolus - N/s 100cc/hr x 1 day -Trend lactic acid-resolved to 1.3   Diarrhea - Hydrate - Stool c.diff     Prolonged QT interval Qt interval prolonged at 513. K- 3.5. -Check Magnesium - Replete K   Stroke (HCC) Hold Plavix while on heparin.   Diabetes mellitus without complication (HCC) Controlled.  A1c 6.4. -Home Lantus 18 units at bedtime, for now  with poor oral intake -Hold Metformin - SSI- M   Essential hypertension Blood pressure soft.  Down to 99 systolic. -Resumed home metoprolol, hold irbesartan and  HCTZ  Morbid obesity BMI 44.82     DVT prophylaxis:Heparin drip Code Status: Full Family Communication: None at bedside Disposition Plan:  Status is: Inpatient Remains inpatient appropriate because: Need for IV medication   Consultants:  Cardiology  Procedures:  None  Antimicrobials:  Anti-infectives (From admission, onward)    Start     Dose/Rate Route Frequency Ordered Stop   11/27/22 1600  vancomycin (VANCOREADY) IVPB 750 mg/150 mL  Status:  Discontinued        750 mg 150 mL/hr over 60 Minutes Intravenous Every 48 hours 11/25/22 1555 11/26/22 0044   11/26/22 1500  ceFEPIme (MAXIPIME) 2 g in sodium chloride 0.9 % 100 mL IVPB  Status:  Discontinued        2 g 200 mL/hr over 30 Minutes Intravenous Every 24 hours 11/25/22 1555 11/25/22 2343   11/26/22 1400  cefTRIAXone (ROCEPHIN) 2 g in sodium chloride 0.9 % 100 mL IVPB        2 g 200 mL/hr over 30 Minutes Intravenous Every 24 hours 11/25/22 2343     11/25/22 1500  vancomycin (VANCOREADY) IVPB 2000 mg/400 mL        2,000 mg 200 mL/hr over 120 Minutes Intravenous  Once 11/25/22 1444 11/25/22 1732   11/25/22 1445  ceFEPIme (MAXIPIME) 2 g in sodium chloride 0.9 % 100 mL IVPB        2 g 200 mL/hr over 30 Minutes Intravenous  Once 11/25/22 1440 11/25/22 1601   11/25/22 1445  metroNIDAZOLE (FLAGYL) IVPB 500 mg        500 mg 100 mL/hr over 60 Minutes Intravenous  Once 11/25/22 1440 11/25/22 1620   11/25/22 1445  vancomycin (VANCOCIN) IVPB 1000 mg/200 mL premix  Status:  Discontinued        1,000 mg 200 mL/hr over 60 Minutes Intravenous  Once 11/25/22 1440 11/25/22 1444      Subjective: Patient seen and evaluated today with no new acute complaints or concerns. No acute concerns or events noted overnight.  Objective: Vitals:   11/26/22 0600 11/26/22 0615 11/26/22 0630 11/26/22 0645  BP: 100/65 111/70 123/88 104/72  Pulse:    (!) 137  Resp: (!) 22 (!) 24 (!) 25 (!) 24  Temp:      TempSrc:      SpO2:    94%  Weight:       Height:        Intake/Output Summary (Last 24 hours) at 11/26/2022 0714 Last data filed at 11/26/2022 1610 Gross per 24 hour  Intake 5036 ml  Output 3150 ml  Net 1886 ml   Filed Weights   11/25/22 1330  Weight: 114.8 kg    Examination:  General exam: Appears calm and comfortable, obese Respiratory system: Clear to auscultation. Respiratory effort normal. Cardiovascular system: S1 & S2 heard, irregular and tachycardic Gastrointestinal system: Abdomen is soft Central nervous system: Alert and awake Extremities: No edema Skin: No significant lesions noted Psychiatry: Flat affect.    Data Reviewed: I have personally reviewed following labs and imaging studies  CBC: Recent Labs  Lab 11/25/22 1317 11/26/22 0529  WBC 14.3* 14.0*  NEUTROABS 10.3*  --   HGB 11.8* 10.8*  HCT 36.1 33.4*  MCV 82.6 84.1  PLT 489* 461*   Basic Metabolic Panel: Recent Labs  Lab 11/25/22 1317 11/25/22 2337  11/26/22 0204  NA 130*  --  136  K 3.5  --  3.8  CL 99  --  105  CO2 16*  --  16*  GLUCOSE 154*  --  127*  BUN 79*  --  58*  CREATININE 3.45*  --  2.58*  CALCIUM 8.3*  --  8.1*  MG  --  1.7  --    GFR: Estimated Creatinine Clearance: 24.8 mL/min (A) (by C-G formula based on SCr of 2.58 mg/dL (H)). Liver Function Tests: Recent Labs  Lab 11/25/22 1317  AST 80*  ALT 52*  ALKPHOS 119  BILITOT 0.6  PROT 7.6  ALBUMIN 2.8*   No results for input(s): "LIPASE", "AMYLASE" in the last 168 hours. No results for input(s): "AMMONIA" in the last 168 hours. Coagulation Profile: Recent Labs  Lab 11/25/22 1453  INR 1.2   Cardiac Enzymes: No results for input(s): "CKTOTAL", "CKMB", "CKMBINDEX", "TROPONINI" in the last 168 hours. BNP (last 3 results) No results for input(s): "PROBNP" in the last 8760 hours. HbA1C: No results for input(s): "HGBA1C" in the last 72 hours. CBG: Recent Labs  Lab 11/26/22 0116 11/26/22 0620  GLUCAP 120* 103*   Lipid Profile: No results for  input(s): "CHOL", "HDL", "LDLCALC", "TRIG", "CHOLHDL", "LDLDIRECT" in the last 72 hours. Thyroid Function Tests: Recent Labs    11/25/22 2337  TSH 0.277*   Anemia Panel: No results for input(s): "VITAMINB12", "FOLATE", "FERRITIN", "TIBC", "IRON", "RETICCTPCT" in the last 72 hours. Sepsis Labs: Recent Labs  Lab 11/25/22 1453 11/25/22 1633 11/25/22 2337  LATICACIDVEN 1.4 2.0* 1.3    Recent Results (from the past 240 hour(s))  Blood Culture (routine x 2)     Status: None (Preliminary result)   Collection Time: 11/25/22  2:53 PM   Specimen: BLOOD  Result Value Ref Range Status   Specimen Description BLOOD BLOOD LEFT ARM  Final   Special Requests   Final    BOTTLES DRAWN AEROBIC AND ANAEROBIC Blood Culture results may not be optimal due to an excessive volume of blood received in culture bottles   Culture   Final    NO GROWTH < 24 HOURS Performed at [facility], [address] [place], [place] [address]    Report Status PENDING  Incomplete  Blood Culture (routine x 2)     Status: None (Preliminary result)   Collection Time: 11/25/22  2:55 PM   Specimen: BLOOD  Result Value Ref Range Status   Specimen Description BLOOD BLOOD RIGHT ARM  Final   Special Requests   Final    BOTTLES DRAWN AEROBIC AND ANAEROBIC Blood Culture adequate volume   Culture  Setup Time   Final    GRAM NEGATIVE RODS ANAEROBIC BOTTLE ONLY CRITICAL RESULT CALLED TO, READ BACK BY AND VERIFIED WITH: Estill Batten [id number] BY VIRAY,J Performed at [facility], [address] [place], [place] [address]    Culture GRAM NEGATIVE RODS  Final   Report Status PENDING  Incomplete  Resp panel by RT-PCR (RSV, Flu A&B, Covid) Anterior Nasal Swab     Status: None   Collection Time: 11/25/22  5:10 PM   Specimen: Anterior Nasal Swab  Result Value Ref Range Status   SARS Coronavirus 2 by RT PCR NEGATIVE NEGATIVE Final    Comment: (NOTE) SARS-CoV-2 target nucleic acids are NOT DETECTED.  The SARS-CoV-2 RNA is  generally detectable in upper respiratory specimens during the acute phase of infection. The lowest concentration of SARS-CoV-2 viral copies this assay can  detect is 138 copies/mL. A negative result does not preclude SARS-Cov-2 infection and should not be used as the sole basis for treatment or other patient management decisions. A negative result may occur with  improper specimen collection/handling, submission of specimen other than nasopharyngeal swab, presence of viral mutation(s) within the areas targeted by this assay, and inadequate number of viral copies(<138 copies/mL). A negative result must be combined with clinical observations, patient history, and epidemiological information. The expected result is Negative.  Fact Sheet for Patients:  BloggerCourse.com  Fact Sheet for Healthcare Providers:  SeriousBroker.it  This test is no t yet approved or cleared by the [place] FDA and  has been authorized for detection and/or diagnosis of SARS-CoV-2 by FDA under an Emergency Use Authorization (EUA). This EUA will remain  in effect (meaning this test can be used) for the duration of the COVID-19 declaration under Section 564(b)(1) of the Act, 21 U.S.C.section 360bbb-3(b)(1), unless the authorization is terminated  or revoked sooner.       Influenza A by PCR NEGATIVE NEGATIVE Final   Influenza B by PCR NEGATIVE NEGATIVE Final    Comment: (NOTE) The Xpert Xpress SARS-CoV-2/FLU/RSV plus assay is intended as an aid in the diagnosis of influenza from Nasopharyngeal swab specimens and should not be used as a sole basis for treatment. Nasal washings and aspirates are unacceptable for Xpert Xpress SARS-CoV-2/FLU/RSV testing.  Fact Sheet for Patients: BloggerCourse.com  Fact Sheet for Healthcare Providers: SeriousBroker.it  This test is not yet approved or cleared by the [place] FDA and has been authorized for detection and/or diagnosis of SARS-CoV-2 by FDA under an Emergency Use Authorization (EUA). This EUA will remain in effect (meaning this test can be used) for the duration of the COVID-19 declaration under Section 564(b)(1) of the Act, 21 U.S.C. section 360bbb-3(b)(1), unless the authorization is terminated or revoked.     Resp Syncytial Virus by PCR NEGATIVE NEGATIVE Final    Comment: (NOTE) Fact Sheet for Patients: BloggerCourse.com  Fact Sheet for Healthcare Providers: SeriousBroker.it  This test is not yet approved or cleared by the [place] FDA and has been authorized for detection and/or diagnosis of SARS-CoV-2 by FDA under an Emergency Use Authorization (EUA). This EUA will remain in effect (meaning this test can be used) for the duration of the COVID-19 declaration under Section 564(b)(1) of the Act, 21 U.S.C. section 360bbb-3(b)(1), unless the authorization is terminated or revoked.  Performed at [facility], [address] [place], [place] [address]          Radiology Studies: CT ABDOMEN PELVIS WO CONTRAST  Result Date: 11/25/2022 CLINICAL DATA:  Weakness with fall onto right side 1 day prior to presentation. Recent vomiting and diarrhea. EXAM: CT ABDOMEN AND PELVIS WITHOUT CONTRAST TECHNIQUE: Multidetector CT imaging of the abdomen and pelvis was performed following the standard protocol without IV contrast. RADIATION DOSE REDUCTION: This exam was performed according to the departmental dose-optimization program which includes automated exposure control, adjustment of the mA and/or kV according to patient size and/or use of iterative reconstruction technique. COMPARISON:  CT abdomen and pelvis dated 06/01/2004, CTA chest dated 01/13/2008 FINDINGS: Lower chest: Medial right middle lobe 10 x 4 mm nodule (3:19), not substantially changed from 2009. No specific follow-up imaging  recommended. No pleural effusion or pneumothorax demonstrated. Partially imaged heart size is normal. Coronary artery calcifications. Hepatobiliary: No focal hepatic lesions. No intra or extrahepatic biliary ductal dilation. Cholecystectomy. Pancreas: No focal lesions or main ductal dilation. Spleen: Normal  in size without focal abnormality. Adrenals/Urinary Tract: No adrenal nodules. No suspicious renal mass on this noncontrast enhanced examination or calculi. Mild bilateral hydroureteronephrosis to the level of the distended urinary bladder. Markedly distended urinary bladder. No mural thickening. Stomach/Bowel: Normal appearance of the stomach. No evidence of bowel wall thickening, distention, or inflammatory changes. Appendix is not discretely seen. Vascular/Lymphatic: Aortic atherosclerosis. No enlarged abdominal or pelvic lymph nodes. Reproductive: No adnexal masses. Other: No free fluid, fluid collection, or free air. Musculoskeletal: No acute or abnormal lytic or blastic osseous lesions. Multilevel degenerative changes of the partially imaged thoracic and lumbar spine. IMPRESSION: 1. Markedly distended urinary bladder with mild bilateral hydroureteronephrosis. Recommend correlation with urinary retention. 2. No other acute abdominopelvic findings. 3. Aortic Atherosclerosis (ICD10-I70.0). Coronary artery calcifications. Assessment for potential risk factor modification, dietary therapy or pharmacologic therapy may be warranted, if clinically indicated. Electronically Signed   By: Agustin Cree M.D.   On: 11/25/2022 18:49   DG Chest Port 1 View  Result Date: 11/25/2022 CLINICAL DATA:  Palpitations EXAM: PORTABLE CHEST 1 VIEW COMPARISON:  X-ray 05/07/2021 FINDINGS: Underinflation. There is some linear opacity at the bases likely scar or atelectasis. No consolidation, pneumothorax or effusion. No edema. Calcified aorta. Overlapping cardiac leads. Degenerative changes of the spine. IMPRESSION: Underinflation with  some basilar atelectasis. Electronically Signed   By: Karen Kays M.D.   On: 11/25/2022 15:40        Scheduled Meds:  insulin aspart  0-9 Units Subcutaneous Q6H   montelukast  10 mg Oral QHS   Continuous Infusions:  sodium chloride 100 mL/hr at 11/25/22 2333   amiodarone 30 mg/hr (11/26/22 0651)   cefTRIAXone (ROCEPHIN)  IV     heparin 1,150 Units/hr (11/26/22 0651)     LOS: 1 day    Time spent: 35 minutes    Karrina Lye Hoover Brunette, DO [facility]  If 7PM-7AM, please contact night-coverage [email or web address] 11/26/2022, 7:14 AM

## 2022-11-26 NOTE — TOC Progression Note (Signed)
Transition of Care [facility]) - Progression Note    Patient Details  Name: [name] MRN: [id number] Date of Birth: 03/08/1950  Transition of Care [facility]) CM/SW Contact  Fidelis Loth A , RN Phone Number: 11/26/2022, 4:44 PM  Clinical Narrative:    Nurse CM at the bedside with patient.  Alert and oriented x4. Spouse passed away in Jan 08, 2012. Lives with Grand-daughter who also has health issues and they try to take care of each other. Patient still drives but due to this medical event has become very weak.  Uses a cane and has a walker as needed at home. Fractured Leg back in 07-Jan-2017 - reason for the cane. Attends [facility] [employer]- GP-Tenika McCorkle-NP. Has as Cardiologist but not able to remember their name. Regular diet at home Patient says she definitely is interested in home health at home after discharge from hospital. Pt not able to remember the last agency used but previous records in Epic indicate [facility] was used and patient says she is fine with that if approved. Patient's only concern is oxygen levels. Patient does not use oxygen at home but says she was told her oxygen levels decreased during the hospital stay. Patient would like for it to be assessed during this hospital stay.    Expected Discharge Plan: Home w Home Health Services Barriers to Discharge: No Barriers Identified  Expected Discharge Plan and Services     Post Acute Care Choice: Home Health Living arrangements for the past 2 months: Single Family Home                           HH Arranged: NA HH Agency: Other - See comment (Has previously used [facility])         Social Determinants of Health (SDOH) Interventions SDOH Screenings   Food Insecurity: No Food Insecurity (11/25/2022)  Housing: Low Risk  (11/25/2022)  Transportation Needs: No Transportation Needs (11/25/2022)  Utilities: Not At Risk (11/25/2022)  Tobacco Use: Medium Risk (11/25/2022)    Readmission Risk Interventions     No data to display

## 2022-11-26 NOTE — Consult Note (Signed)
PHARMACY - ANTICOAGULATION CONSULT NOTE  Pharmacy Consult for heparin infusion Indication: atrial fibrillation  Allergies  Allergen Reactions   Ciprofloxacin Diarrhea and Nausea And Vomiting    stomach pain   Latex Itching   Sulfa Antibiotics Itching and Rash   Patient Measurements: Height: 5\' 3"  (160 cm) Weight: 114.8 kg (253 lb) IBW/kg (Calculated) : 52.4 Heparin Dosing Weight: 80.3 kg  Vital Signs: Temp: 98.2 F (36.8 C) (10/04 0447) Temp Source: Rectal (10/03 2321) BP: 102/79 (10/04 0715) Pulse Rate: 127 (10/04 0715)  Labs: Recent Labs    11/25/22 1317 11/25/22 1453 11/25/22 2337 11/26/22 0204 11/26/22 0529  HGB 11.8*  --   --   --  10.8*  HCT 36.1  --   --   --  33.4*  PLT 489*  --   --   --  461*  APTT  --  27  --   --   --   LABPROT  --  14.9  --   --   --   INR  --  1.2  --   --   --   HEPARINUNFRC  --   --   --  0.31  --   CREATININE 3.45*  --   --  2.58*  --   TROPONINIHS  --   --  10 10  --     Estimated Creatinine Clearance: 24.8 mL/min (A) (by C-G formula based on SCr of 2.58 mg/dL (H)).   Medical History: Past Medical History:  Diagnosis Date   Bipolar 1 disorder (HCC)    Cancer (HCC)    Diabetes mellitus without complication (HCC)    Hypertension    PONV (postoperative nausea and vomiting)    Stroke [facility])     Assessment: 72 yo female presented to ED with abdominal discomfort and diarrhea.  EKG in ED showed patient to be in Afib.  Pharmacy consulted for initiation of heparin infusion.  Heparin level continues to be at goal on 1150 units/hr. CBC stable overnight. No bleeding issues noted.   Goal of Therapy:  Heparin level 0.3-0.7 units/ml Monitor platelets by anticoagulation protocol: Yes   Plan:  Continue heparin at 1200 units/hr Heparin level daily with CBC  Sheppard Coil PharmD., BCPS Clinical Pharmacist 11/26/2022 7:45 AM

## 2022-11-26 NOTE — Progress Notes (Signed)
PHARMACY - PHYSICIAN COMMUNICATION CRITICAL VALUE ALERT - BLOOD CULTURE IDENTIFICATION (BCID)  [name] is an 72 y.o. female who presented to [facility] on 11/25/2022 with a chief complaint of diarrhea, vomiting, and abdominal pain.   Assessment:  Patient with distended urinary bladder and bilateral hydroureteronephrosis, possible uti. Also with possible gastroenteritis. Now with positive blood culture with 1/4 bottles positive for ecoli.    Name of physician (or Provider) Contacted: Sherryll Burger  Current antibiotics: Ceftriaxone  Changes to prescribed antibiotics recommended:  Patient is on recommended antibiotics - No changes needed  Results for orders placed or performed during the hospital encounter of 11/25/22  Blood Culture ID Panel (Reflexed) (Collected: 11/25/2022  2:55 PM)  Result Value Ref Range   Enterococcus faecalis NOT DETECTED NOT DETECTED   Enterococcus Faecium NOT DETECTED NOT DETECTED   Listeria monocytogenes NOT DETECTED NOT DETECTED   Staphylococcus species NOT DETECTED NOT DETECTED   Staphylococcus aureus (BCID) NOT DETECTED NOT DETECTED   Staphylococcus epidermidis NOT DETECTED NOT DETECTED   Staphylococcus lugdunensis NOT DETECTED NOT DETECTED   Streptococcus species NOT DETECTED NOT DETECTED   Streptococcus agalactiae NOT DETECTED NOT DETECTED   Streptococcus pneumoniae NOT DETECTED NOT DETECTED   Streptococcus pyogenes NOT DETECTED NOT DETECTED   A.calcoaceticus-baumannii NOT DETECTED NOT DETECTED   Bacteroides fragilis NOT DETECTED NOT DETECTED   Enterobacterales DETECTED (A) NOT DETECTED   Enterobacter cloacae complex NOT DETECTED NOT DETECTED   Escherichia coli DETECTED (A) NOT DETECTED   Klebsiella aerogenes NOT DETECTED NOT DETECTED   Klebsiella oxytoca NOT DETECTED NOT DETECTED   Klebsiella pneumoniae NOT DETECTED NOT DETECTED   Proteus species NOT DETECTED NOT DETECTED   Salmonella species NOT DETECTED NOT DETECTED   Serratia marcescens NOT  DETECTED NOT DETECTED   Haemophilus influenzae NOT DETECTED NOT DETECTED   Neisseria meningitidis NOT DETECTED NOT DETECTED   Pseudomonas aeruginosa NOT DETECTED NOT DETECTED   Stenotrophomonas maltophilia NOT DETECTED NOT DETECTED   Candida albicans NOT DETECTED NOT DETECTED   Candida auris NOT DETECTED NOT DETECTED   Candida glabrata NOT DETECTED NOT DETECTED   Candida krusei NOT DETECTED NOT DETECTED   Candida parapsilosis NOT DETECTED NOT DETECTED   Candida tropicalis NOT DETECTED NOT DETECTED   Cryptococcus neoformans/gattii NOT DETECTED NOT DETECTED   CTX-M ESBL NOT DETECTED NOT DETECTED   Carbapenem resistance IMP NOT DETECTED NOT DETECTED   Carbapenem resistance KPC NOT DETECTED NOT DETECTED   Carbapenem resistance NDM NOT DETECTED NOT DETECTED   Carbapenem resist OXA 48 LIKE NOT DETECTED NOT DETECTED   Carbapenem resistance VIM NOT DETECTED NOT DETECTED   Sheppard Coil PharmD., BCPS Clinical Pharmacist 11/26/2022 12:14 PM

## 2022-11-26 NOTE — Plan of Care (Signed)
Problem: Education: Goal: Knowledge of disease or condition will improve Outcome: Progressing Goal: Knowledge of secondary prevention will improve (MUST DOCUMENT ALL) Outcome: Progressing Goal: Knowledge of patient specific risk factors will improve Loraine Leriche N/A or DELETE if not current risk factor) Outcome: Progressing   Problem: Ischemic Stroke/TIA Tissue Perfusion: Goal: Complications of ischemic stroke/TIA will be minimized Outcome: Progressing   Problem: Coping: Goal: Will verbalize positive feelings about self Outcome: Progressing Goal: Will identify appropriate support needs Outcome: Progressing   Problem: Health Behavior/Discharge Planning: Goal: Ability to manage health-related needs will improve Outcome: Progressing Goal: Goals will be collaboratively established with patient/family Outcome: Progressing   Problem: Self-Care: Goal: Ability to participate in self-care as condition permits will improve Outcome: Progressing Goal: Verbalization of feelings and concerns over difficulty with self-care will improve Outcome: Progressing Goal: Ability to communicate needs accurately will improve Outcome: Progressing   Problem: Nutrition: Goal: Risk of aspiration will decrease Outcome: Progressing Goal: Dietary intake will improve Outcome: Progressing   Problem: Education: Goal: Knowledge of General Education information will improve Description: Including pain rating scale, medication(s)/side effects and non-pharmacologic comfort measures Outcome: Progressing   Problem: Health Behavior/Discharge Planning: Goal: Ability to manage health-related needs will improve Outcome: Progressing   Problem: Clinical Measurements: Goal: Ability to maintain clinical measurements within normal limits will improve Outcome: Progressing Goal: Will remain free from infection Outcome: Progressing Goal: Diagnostic test results will improve Outcome: Progressing Goal: Respiratory  complications will improve Outcome: Progressing Goal: Cardiovascular complication will be avoided Outcome: Progressing   Problem: Activity: Goal: Risk for activity intolerance will decrease Outcome: Progressing   Problem: Nutrition: Goal: Adequate nutrition will be maintained Outcome: Progressing   Problem: Coping: Goal: Level of anxiety will decrease Outcome: Progressing   Problem: Elimination: Goal: Will not experience complications related to bowel motility Outcome: Progressing Goal: Will not experience complications related to urinary retention Outcome: Progressing   Problem: Pain Managment: Goal: General experience of comfort will improve Outcome: Progressing   Problem: Safety: Goal: Ability to remain free from injury will improve Outcome: Progressing   Problem: Skin Integrity: Goal: Risk for impaired skin integrity will decrease Outcome: Progressing   Problem: Education: Goal: Knowledge of disease or condition will improve Outcome: Progressing Goal: Understanding of medication regimen will improve Outcome: Progressing Goal: Individualized Educational Video(s) Outcome: Progressing   Problem: Activity: Goal: Ability to tolerate increased activity will improve Outcome: Progressing   Problem: Cardiac: Goal: Ability to achieve and maintain adequate cardiopulmonary perfusion will improve Outcome: Progressing   Problem: Health Behavior/Discharge Planning: Goal: Ability to safely manage health-related needs after discharge will improve Outcome: Progressing   Problem: Education: Goal: Ability to describe self-care measures that may prevent or decrease complications (Diabetes Survival Skills Education) will improve Outcome: Progressing Goal: Individualized Educational Video(s) Outcome: Progressing   Problem: Coping: Goal: Ability to adjust to condition or change in health will improve Outcome: Progressing   Problem: Fluid Volume: Goal: Ability to maintain  a balanced intake and output will improve Outcome: Progressing   Problem: Health Behavior/Discharge Planning: Goal: Ability to identify and utilize available resources and services will improve Outcome: Progressing Goal: Ability to manage health-related needs will improve Outcome: Progressing   Problem: Metabolic: Goal: Ability to maintain appropriate glucose levels will improve Outcome: Progressing   Problem: Nutritional: Goal: Maintenance of adequate nutrition will improve Outcome: Progressing Goal: Progress toward achieving an optimal weight will improve Outcome: Progressing   Problem: Skin Integrity: Goal: Risk for impaired skin integrity will decrease Outcome: Progressing   Problem: Tissue  Perfusion: Goal: Adequacy of tissue perfusion will improve Outcome: Progressing

## 2022-11-27 ENCOUNTER — Inpatient Hospital Stay (HOSPITAL_COMMUNITY): Payer: Medicare HMO

## 2022-11-27 DIAGNOSIS — I4891 Unspecified atrial fibrillation: Secondary | ICD-10-CM

## 2022-11-27 LAB — URINE CULTURE: Culture: 20000 — AB

## 2022-11-27 LAB — CBC
HCT: 30.1 % — ABNORMAL LOW (ref 36.0–46.0)
Hemoglobin: 9.7 g/dL — ABNORMAL LOW (ref 12.0–15.0)
MCH: 26.6 pg (ref 26.0–34.0)
MCHC: 32.2 g/dL (ref 30.0–36.0)
MCV: 82.7 fL (ref 80.0–100.0)
Platelets: 515 10*3/uL — ABNORMAL HIGH (ref 150–400)
RBC: 3.64 MIL/uL — ABNORMAL LOW (ref 3.87–5.11)
RDW: 14.6 % (ref 11.5–15.5)
WBC: 14.3 10*3/uL — ABNORMAL HIGH (ref 4.0–10.5)
nRBC: 0 % (ref 0.0–0.2)

## 2022-11-27 LAB — COMPREHENSIVE METABOLIC PANEL
ALT: 47 U/L — ABNORMAL HIGH (ref 0–44)
AST: 58 U/L — ABNORMAL HIGH (ref 15–41)
Albumin: 2.3 g/dL — ABNORMAL LOW (ref 3.5–5.0)
Alkaline Phosphatase: 102 U/L (ref 38–126)
Anion gap: 11 (ref 5–15)
BUN: 31 mg/dL — ABNORMAL HIGH (ref 8–23)
CO2: 17 mmol/L — ABNORMAL LOW (ref 22–32)
Calcium: 8.5 mg/dL — ABNORMAL LOW (ref 8.9–10.3)
Chloride: 110 mmol/L (ref 98–111)
Creatinine, Ser: 1.51 mg/dL — ABNORMAL HIGH (ref 0.44–1.00)
GFR, Estimated: 37 mL/min — ABNORMAL LOW (ref >60)
Glucose, Bld: 121 mg/dL — ABNORMAL HIGH (ref 70–99)
Potassium: 3.8 mmol/L (ref 3.5–5.1)
Sodium: 138 mmol/L (ref 135–145)
Total Bilirubin: 0.5 mg/dL (ref 0.3–1.2)
Total Protein: 6.1 g/dL — ABNORMAL LOW (ref 6.5–8.1)

## 2022-11-27 LAB — GLUCOSE, CAPILLARY
Glucose-Capillary: 110 mg/dL — ABNORMAL HIGH (ref 70–99)
Glucose-Capillary: 121 mg/dL — ABNORMAL HIGH (ref 70–99)
Glucose-Capillary: 122 mg/dL — ABNORMAL HIGH (ref 70–99)
Glucose-Capillary: 80 mg/dL (ref 70–99)

## 2022-11-27 LAB — ECHOCARDIOGRAM COMPLETE
Area-P 1/2: 5.2 cm2
Height: 63 in
S' Lateral: 3.3 cm
Weight: 3961.23 [oz_av]

## 2022-11-27 LAB — C DIFFICILE QUICK SCREEN W PCR REFLEX
C Diff antigen: NEGATIVE
C Diff interpretation: NOT DETECTED
C Diff toxin: NEGATIVE

## 2022-11-27 LAB — MAGNESIUM: Magnesium: 1.4 mg/dL — ABNORMAL LOW (ref 1.7–2.4)

## 2022-11-27 LAB — HEPARIN LEVEL (UNFRACTIONATED): Heparin Unfractionated: 0.1 [IU]/mL — ABNORMAL LOW (ref 0.30–0.70)

## 2022-11-27 MED ORDER — MAGNESIUM SULFATE 2 GM/50ML IV SOLN
2.0000 g | Freq: Once | INTRAVENOUS | Status: AC
  Administered 2022-11-27: 2 g via INTRAVENOUS
  Filled 2022-11-27: qty 50

## 2022-11-27 MED ORDER — APIXABAN 5 MG PO TABS
5.0000 mg | ORAL_TABLET | Freq: Two times a day (BID) | ORAL | Status: DC
  Administered 2022-11-27 – 2022-11-29 (×5): 5 mg via ORAL
  Filled 2022-11-27 (×5): qty 1

## 2022-11-27 MED ORDER — ORAL CARE MOUTH RINSE: 15.0000 mL | OROMUCOSAL | Status: DC | PRN

## 2022-11-27 MED ORDER — METOPROLOL TARTRATE 5 MG/5ML IV SOLN
2.5000 mg | Freq: Once | INTRAVENOUS | Status: AC
  Administered 2022-11-27: 2.5 mg via INTRAVENOUS
  Filled 2022-11-27: qty 5

## 2022-11-27 MED ORDER — PERFLUTREN LIPID MICROSPHERE
1.0000 mL | INTRAVENOUS | Status: AC | PRN
  Administered 2022-11-27: 3 mL via INTRAVENOUS

## 2022-11-27 NOTE — Plan of Care (Signed)
Problem: Education: Goal: Knowledge of disease or condition will improve Outcome: Progressing Goal: Knowledge of secondary prevention will improve (MUST DOCUMENT ALL) Outcome: Progressing Goal: Knowledge of patient specific risk factors will improve Loraine Leriche N/A or DELETE if not current risk factor) Outcome: Progressing   Problem: Ischemic Stroke/TIA Tissue Perfusion: Goal: Complications of ischemic stroke/TIA will be minimized Outcome: Progressing   Problem: Coping: Goal: Will verbalize positive feelings about self Outcome: Progressing Goal: Will identify appropriate support needs Outcome: Progressing   Problem: Health Behavior/Discharge Planning: Goal: Ability to manage health-related needs will improve Outcome: Progressing Goal: Goals will be collaboratively established with patient/family Outcome: Progressing   Problem: Self-Care: Goal: Ability to participate in self-care as condition permits will improve Outcome: Progressing Goal: Verbalization of feelings and concerns over difficulty with self-care will improve Outcome: Progressing Goal: Ability to communicate needs accurately will improve Outcome: Progressing   Problem: Nutrition: Goal: Risk of aspiration will decrease Outcome: Progressing Goal: Dietary intake will improve Outcome: Progressing   Problem: Education: Goal: Knowledge of General Education information will improve Description: Including pain rating scale, medication(s)/side effects and non-pharmacologic comfort measures Outcome: Progressing   Problem: Health Behavior/Discharge Planning: Goal: Ability to manage health-related needs will improve Outcome: Progressing   Problem: Clinical Measurements: Goal: Ability to maintain clinical measurements within normal limits will improve Outcome: Progressing Goal: Will remain free from infection Outcome: Progressing Goal: Diagnostic test results will improve Outcome: Progressing Goal: Respiratory  complications will improve Outcome: Progressing Goal: Cardiovascular complication will be avoided Outcome: Progressing   Problem: Activity: Goal: Risk for activity intolerance will decrease Outcome: Progressing   Problem: Nutrition: Goal: Adequate nutrition will be maintained Outcome: Progressing   Problem: Coping: Goal: Level of anxiety will decrease Outcome: Progressing   Problem: Elimination: Goal: Will not experience complications related to bowel motility Outcome: Progressing Goal: Will not experience complications related to urinary retention Outcome: Progressing   Problem: Pain Managment: Goal: General experience of comfort will improve Outcome: Progressing   Problem: Safety: Goal: Ability to remain free from injury will improve Outcome: Progressing   Problem: Skin Integrity: Goal: Risk for impaired skin integrity will decrease Outcome: Progressing   Problem: Education: Goal: Knowledge of disease or condition will improve Outcome: Progressing Goal: Understanding of medication regimen will improve Outcome: Progressing Goal: Individualized Educational Video(s) Outcome: Progressing   Problem: Activity: Goal: Ability to tolerate increased activity will improve Outcome: Progressing   Problem: Cardiac: Goal: Ability to achieve and maintain adequate cardiopulmonary perfusion will improve Outcome: Progressing   Problem: Health Behavior/Discharge Planning: Goal: Ability to safely manage health-related needs after discharge will improve Outcome: Progressing   Problem: Education: Goal: Ability to describe self-care measures that may prevent or decrease complications (Diabetes Survival Skills Education) will improve Outcome: Progressing Goal: Individualized Educational Video(s) Outcome: Progressing   Problem: Coping: Goal: Ability to adjust to condition or change in health will improve Outcome: Progressing   Problem: Fluid Volume: Goal: Ability to maintain  a balanced intake and output will improve Outcome: Progressing   Problem: Health Behavior/Discharge Planning: Goal: Ability to identify and utilize available resources and services will improve Outcome: Progressing Goal: Ability to manage health-related needs will improve Outcome: Progressing   Problem: Metabolic: Goal: Ability to maintain appropriate glucose levels will improve Outcome: Progressing   Problem: Nutritional: Goal: Maintenance of adequate nutrition will improve Outcome: Progressing Goal: Progress toward achieving an optimal weight will improve Outcome: Progressing   Problem: Skin Integrity: Goal: Risk for impaired skin integrity will decrease Outcome: Progressing   Problem: Tissue  Perfusion: Goal: Adequacy of tissue perfusion will improve Outcome: Progressing

## 2022-11-27 NOTE — Progress Notes (Signed)
Foley cath was Dc`d at 1:30pm attempted to void x1 earlier unsuccessfully. Bladder scan 404 ml in bladder. Encouraged to void and assisted to the bathroom. Patient voided 300 ml.  Post Void scan 73 Ml  11/27/22 1837  Output (mL)  Urine 300 mL  Urine Characteristics  Urinary Interventions Bladder scan  Bladder Scan Volume (mL) 404 mL  Post Void Residual 73 mL  Hygiene Peri care

## 2022-11-27 NOTE — Progress Notes (Signed)
Central telemetry department reported RVR with heart rate of 132. MD on call notififed. EKG obtained. Medications given. Provider came to department. No new orders. Plan of care ongoing.

## 2022-11-27 NOTE — Discharge Instructions (Signed)

## 2022-11-27 NOTE — Progress Notes (Signed)
Repaged MD on call via [facility] pager.

## 2022-11-27 NOTE — Progress Notes (Signed)
PROGRESS NOTE    [name]  LOV:[id number] DOB: 1950/11/19 DOA: 11/25/2022 PCP: [facility]   Brief Narrative:    [name] is a 72 y.o. female with medical history significant for hypertension, diabetes mellitus, bipolar disorder, stroke, morbid obesity. Patient presented to the ED with complaints of multiple episodes/continuous diarrhea that started yesterday till this morning with associated abdominal pain.  She reports about 3 days of vomiting that has now resolved but this occurred about 2 days prior to onset of diarrhea.   She reports palpitations also, with generalized weakness.  Patient was admitted with atrial fibrillation with RVR in the setting of severe sepsis with UTI and diarrhea.  She is also noted to have AKI along with acute urinary retention.  Now with some E. coli bacteremia noted.  Assessment & Plan:   Principal Problem:   Atrial fibrillation with RVR (HCC) Active Problems:   Severe sepsis (HCC)   UTI (urinary tract infection)   Acute urinary retention   AKI (acute kidney injury) (HCC)   Prolonged QT interval   Diarrhea   Morbid obesity (HCC)   Essential hypertension   Bipolar 1 disorder (HCC)   Diabetes mellitus without complication (HCC)   Stroke (HCC)  Assessment and Plan:   Atrial fibrillation with RVR (HCC)-now in sinus rhythm Persistently elevated heart rate 138- 152, despite amiodarone bolus and drip, over 3 hours ago.  No history of atrial fibrillation. CHAd2Vasc score- at least 6, for age,sex, stroke diabetes and hypertensive history.   Atria fibrillation likely driven by severe sepsis. -Discontinue amiodarone drip as patient is now in sinus rhythm -Continue home metoprolol -Trend troponin -TSH 0.277, free T4 0.9 -Appreciate cardiology evaluation with resumption of home metoprolol -Echocardiogram pending   AKI (acute kidney injury) (HCC)-improving Creatinine 3.45, markedly elevated from baseline ~1.  AKI in the setting  of severe sepsis,dehydration from GI losses, on ARB's- irbesartan, and HCTZ, also possibly some post-renal component considering mild bilateral hydroureteronephros on CT. - Hydrate  - Foley -Hold irbesartan and hydrochlorothiazide -Continue to monitor in a.m.   Acute urinary retention CT abdomen and pelvis showing- Markedly distended urinary bladder with mild bilateral hydroureteronephrosis.  Patient was able to void large amount in the ED.  Subsequently, Bladder scan in the ED, showed 1.2 L of urine. -Insert Foley catheter -Void trial   Severe sepsis (HCC) Severe sepsis secondary to UTI with E. coli bacteremia.  Meeting sepsis criteria with tachycardia heart rate 138 -152, tachypnea respiratory 23-34, evidence of endorgan dysfunction, kidney injury and lactic acidosis 1.4 > 2.  Possibly some gastroenteritis/Diarrhea also with resultant dehydration..  CT abdomen and pelvis -suggesting urinary retention otherwise negative for other acute abdominal pelvic findings. -Follow-up Urine cultures with noted E. coli -Blood cultures with 1/4 bottles with E. coli -cefepime and metronidazole started in the ED, will continue with IV ceftriaxone - Additional 500 mL bolus given, totaling 4 L bolus - N/s 100cc/hr x 1 day -Trend lactic acid-resolved to 1.3   Diarrhea-improved - Stool c.diff negative -GI panel pending     Prolonged QT interval Qt interval prolonged at 513. K- 3.5. -Check Magnesium - Replete K   Stroke (HCC) Hold Plavix while on heparin.   Diabetes mellitus without complication (HCC) Controlled.  A1c 6.4. -Home Lantus 18 units at bedtime, for now with poor oral intake -Hold Metformin - SSI- M   Essential hypertension Blood pressure soft.  Down to 99 systolic. -Resumed home metoprolol, hold irbesartan and HCTZ  Morbid obesity BMI 44.82    DVT prophylaxis:apixaban Code Status: Full Family Communication: None at bedside Disposition Plan: Continue IV antibiotics Status  is: Inpatient Remains inpatient appropriate because: Need for IV medications.   Consultants:  None  Procedures:  None  Antimicrobials:  Anti-infectives (From admission, onward)    Start     Dose/Rate Route Frequency Ordered Stop   11/27/22 1600  vancomycin (VANCOREADY) IVPB 750 mg/150 mL  Status:  Discontinued        750 mg 150 mL/hr over 60 Minutes Intravenous Every 48 hours 11/25/22 1555 11/26/22 0044   11/26/22 1500  ceFEPIme (MAXIPIME) 2 g in sodium chloride 0.9 % 100 mL IVPB  Status:  Discontinued        2 g 200 mL/hr over 30 Minutes Intravenous Every 24 hours 11/25/22 1555 11/25/22 2343   11/26/22 1400  cefTRIAXone (ROCEPHIN) 2 g in sodium chloride 0.9 % 100 mL IVPB        2 g 200 mL/hr over 30 Minutes Intravenous Every 24 hours 11/25/22 2343     11/25/22 1500  vancomycin (VANCOREADY) IVPB 2000 mg/400 mL        2,000 mg 200 mL/hr over 120 Minutes Intravenous  Once 11/25/22 1444 11/25/22 1732   11/25/22 1445  ceFEPIme (MAXIPIME) 2 g in sodium chloride 0.9 % 100 mL IVPB        2 g 200 mL/hr over 30 Minutes Intravenous  Once 11/25/22 1440 11/25/22 1601   11/25/22 1445  metroNIDAZOLE (FLAGYL) IVPB 500 mg        500 mg 100 mL/hr over 60 Minutes Intravenous  Once 11/25/22 1440 11/25/22 1620   11/25/22 1445  vancomycin (VANCOCIN) IVPB 1000 mg/200 mL premix  Status:  Discontinued        1,000 mg 200 mL/hr over 60 Minutes Intravenous  Once 11/25/22 1440 11/25/22 1444      Subjective: Patient seen and evaluated today with no new acute complaints or concerns. No acute concerns or events noted overnight.  Objective: Vitals:   11/27/22 0803 11/27/22 0900 11/27/22 0950 11/27/22 1000  BP:  132/65  (!) 142/69  Pulse:  92 92 89  Resp:  (!) 23 (!) 24 (!) 23  Temp: 99 F (37.2 C)     TempSrc: Axillary     SpO2:  95% 92%   Weight:      Height:        Intake/Output Summary (Last 24 hours) at 11/27/2022 1208 Last data filed at 11/27/2022 1000 Gross per 24 hour  Intake 3337.33  ml  Output 3200 ml  Net 137.33 ml   Filed Weights   11/25/22 1330 11/26/22 1153  Weight: 114.8 kg 112.3 kg    Examination:  General exam: Appears calm and comfortable  Respiratory system: Clear to auscultation. Respiratory effort normal.  2 L nasal cannula Cardiovascular system: S1 & S2 heard, RRR.  Gastrointestinal system: Abdomen is soft Central nervous system: Alert and awake Extremities: No edema Skin: No significant lesions noted Psychiatry: Flat affect.    Data Reviewed: I have personally reviewed following labs and imaging studies  CBC: Recent Labs  Lab 11/25/22 1317 11/26/22 0529 11/27/22 0501  WBC 14.3* 14.0* 14.3*  NEUTROABS 10.3*  --   --   HGB 11.8* 10.8* 9.7*  HCT 36.1 33.4* 30.1*  MCV 82.6 84.1 82.7  PLT 489* 461* 515*   Basic Metabolic Panel: Recent Labs  Lab 11/25/22 1317 11/25/22 2337 11/26/22 0204 11/27/22 0501  NA 130*  --  136 138  K 3.5  --  3.8 3.8  CL 99  --  105 110  CO2 16*  --  16* 17*  GLUCOSE 154*  --  127* 121*  BUN 79*  --  58* 31*  CREATININE 3.45*  --  2.58* 1.51*  CALCIUM 8.3*  --  8.1* 8.5*  MG  --  1.7  --  1.4*   GFR: Estimated Creatinine Clearance: 41.8 mL/min (A) (by C-G formula based on SCr of 1.51 mg/dL (H)). Liver Function Tests: Recent Labs  Lab 11/25/22 1317 11/27/22 0501  AST 80* 58*  ALT 52* 47*  ALKPHOS 119 102  BILITOT 0.6 0.5  PROT 7.6 6.1*  ALBUMIN 2.8* 2.3*   No results for input(s): "LIPASE", "AMYLASE" in the last 168 hours. No results for input(s): "AMMONIA" in the last 168 hours. Coagulation Profile: Recent Labs  Lab 11/25/22 1453  INR 1.2   Cardiac Enzymes: No results for input(s): "CKTOTAL", "CKMB", "CKMBINDEX", "TROPONINI" in the last 168 hours. BNP (last 3 results) No results for input(s): "PROBNP" in the last 8760 hours. HbA1C: No results for input(s): "HGBA1C" in the last 72 hours. CBG: Recent Labs  Lab 11/26/22 0620 11/26/22 1157 11/26/22 1807 11/26/22 2350  11/27/22 0602  GLUCAP 103* 207* 110* 120* 110*   Lipid Profile: No results for input(s): "CHOL", "HDL", "LDLCALC", "TRIG", "CHOLHDL", "LDLDIRECT" in the last 72 hours. Thyroid Function Tests: Recent Labs    11/25/22 2337 11/26/22 0751  TSH 0.277*  --   FREET4  --  1.63*   Anemia Panel: No results for input(s): "VITAMINB12", "FOLATE", "FERRITIN", "TIBC", "IRON", "RETICCTPCT" in the last 72 hours. Sepsis Labs: Recent Labs  Lab 11/25/22 1453 11/25/22 1633 11/25/22 2337  LATICACIDVEN 1.4 2.0* 1.3    Recent Results (from the past 240 hour(s))  Blood Culture (routine x 2)     Status: None (Preliminary result)   Collection Time: 11/25/22  2:53 PM   Specimen: BLOOD  Result Value Ref Range Status   Specimen Description BLOOD BLOOD LEFT ARM  Final   Special Requests   Final    BOTTLES DRAWN AEROBIC AND ANAEROBIC Blood Culture results may not be optimal due to an excessive volume of blood received in culture bottles   Culture   Final    NO GROWTH 2 DAYS Performed at [facility], [address] [place], [place] [address]    Report Status PENDING  Incomplete  Blood Culture (routine x 2)     Status: Abnormal (Preliminary result)   Collection Time: 11/25/22  2:55 PM   Specimen: BLOOD RIGHT ARM  Result Value Ref Range Status   Specimen Description   Final    BLOOD RIGHT ARM Performed at [facility], [address], [place], [place] [address]    Special Requests   Final    BOTTLES DRAWN AEROBIC AND ANAEROBIC Blood Culture adequate volume Performed at [facility], [address], [place], [place] [address]    Culture  Setup Time   Final    GRAM NEGATIVE RODS ANAEROBIC BOTTLE ONLY CRITICAL RESULT CALLED TO, READ BACK BY AND VERIFIED WITH: KELLY GIBBS [id number] [id number] BY VIRAY,J    Culture (A)  Final    ESCHERICHIA COLI SUSCEPTIBILITIES TO FOLLOW Performed at [facility], [address] [place], [place] [address]    Report Status PENDING  Incomplete  Blood Culture  ID Panel (Reflexed)     Status: Abnormal   Collection Time: 11/25/22  2:55 PM  Result Value Ref Range Status   Enterococcus faecalis NOT DETECTED NOT DETECTED Final   Enterococcus Faecium NOT DETECTED NOT DETECTED Final   Listeria monocytogenes NOT DETECTED NOT DETECTED Final   Staphylococcus species NOT DETECTED NOT DETECTED Final   Staphylococcus aureus (BCID) NOT DETECTED NOT DETECTED Final   Staphylococcus epidermidis NOT DETECTED NOT DETECTED Final   Staphylococcus lugdunensis NOT DETECTED NOT DETECTED Final   Streptococcus species NOT DETECTED NOT DETECTED Final   Streptococcus agalactiae NOT DETECTED NOT DETECTED Final   Streptococcus pneumoniae NOT DETECTED NOT DETECTED Final   Streptococcus pyogenes NOT DETECTED NOT DETECTED Final   A.calcoaceticus-baumannii NOT DETECTED NOT DETECTED Final   Bacteroides fragilis NOT DETECTED NOT DETECTED Final   Enterobacterales DETECTED (A) NOT DETECTED Final    Comment: Enterobacterales represent a large order of gram negative bacteria, not a single organism. CRITICAL RESULT CALLED TO, READ BACK BY AND VERIFIED WITH: Kelby Fam PHARMD, AT 1209 11/26/22 D. VANHOOK    Enterobacter cloacae complex NOT DETECTED NOT DETECTED Final   Escherichia coli DETECTED (A) NOT DETECTED Final    Comment: CRITICAL RESULT CALLED TO, READ BACK BY AND VERIFIED WITH: Kelby Fam PHARMD, AT 1209 11/26/22 D. VANHOOK    Klebsiella aerogenes NOT DETECTED NOT DETECTED Final   Klebsiella oxytoca NOT DETECTED NOT DETECTED Final   Klebsiella pneumoniae NOT DETECTED NOT DETECTED Final   Proteus species NOT DETECTED NOT DETECTED Final   Salmonella species NOT DETECTED NOT DETECTED Final   Serratia marcescens NOT DETECTED NOT DETECTED Final   Haemophilus influenzae NOT DETECTED NOT DETECTED Final   Neisseria meningitidis NOT DETECTED NOT DETECTED Final   Pseudomonas aeruginosa NOT DETECTED NOT DETECTED Final   Stenotrophomonas maltophilia NOT DETECTED NOT DETECTED Final    Candida albicans NOT DETECTED NOT DETECTED Final   Candida auris NOT DETECTED NOT DETECTED Final   Candida glabrata NOT DETECTED NOT DETECTED Final   Candida krusei NOT DETECTED NOT DETECTED Final   Candida parapsilosis NOT DETECTED NOT DETECTED Final   Candida tropicalis NOT DETECTED NOT DETECTED Final   Cryptococcus neoformans/gattii NOT DETECTED NOT DETECTED Final   CTX-M ESBL NOT DETECTED NOT DETECTED Final   Carbapenem resistance IMP NOT DETECTED NOT DETECTED Final   Carbapenem resistance KPC NOT DETECTED NOT DETECTED Final   Carbapenem resistance NDM NOT DETECTED NOT DETECTED Final   Carbapenem resist OXA 48 LIKE NOT DETECTED NOT DETECTED Final   Carbapenem resistance VIM NOT DETECTED NOT DETECTED Final    Comment: Performed at [facility], [address], [place], [place] [address]  Urine Culture     Status: Abnormal   Collection Time: 11/25/22  4:25 PM   Specimen: Urine, Random  Result Value Ref Range Status   Specimen Description   Final    URINE, RANDOM Performed at [facility], [address] [place], [place] [address]    Special Requests   Final    NONE Reflexed from [phone] Performed at [facility], [address], [place], [place] [address]    Culture 20,000 COLONIES/mL ESCHERICHIA COLI (A)  Final   Report Status 11/27/2022 FINAL  Final   Organism ID, Bacteria ESCHERICHIA COLI (A)  Final      Susceptibility   Escherichia coli - MIC*    AMPICILLIN >=32 RESISTANT Resistant     CEFAZOLIN <=4 SENSITIVE Sensitive     CEFEPIME <=0.12 SENSITIVE Sensitive     CEFTRIAXONE <=0.25 SENSITIVE Sensitive     CIPROFLOXACIN 0.5 INTERMEDIATE Intermediate  GENTAMICIN <=1 SENSITIVE Sensitive     IMIPENEM <=0.25 SENSITIVE Sensitive     NITROFURANTOIN <=16 SENSITIVE Sensitive     TRIMETH/SULFA >=320 RESISTANT Resistant     AMPICILLIN/SULBACTAM 4 SENSITIVE Sensitive     PIP/TAZO <=4 SENSITIVE Sensitive     * 20,000 COLONIES/mL ESCHERICHIA COLI  Resp panel by RT-PCR (RSV,  Flu A&B, Covid) Anterior Nasal Swab     Status: None   Collection Time: 11/25/22  5:10 PM   Specimen: Anterior Nasal Swab  Result Value Ref Range Status   SARS Coronavirus 2 by RT PCR NEGATIVE NEGATIVE Final    Comment: (NOTE) SARS-CoV-2 target nucleic acids are NOT DETECTED.  The SARS-CoV-2 RNA is generally detectable in upper respiratory specimens during the acute phase of infection. The lowest concentration of SARS-CoV-2 viral copies this assay can detect is 138 copies/mL. A negative result does not preclude SARS-Cov-2 infection and should not be used as the sole basis for treatment or other patient management decisions. A negative result may occur with  improper specimen collection/handling, submission of specimen other than nasopharyngeal swab, presence of viral mutation(s) within the areas targeted by this assay, and inadequate number of viral copies(<138 copies/mL). A negative result must be combined with clinical observations, patient history, and epidemiological information. The expected result is Negative.  Fact Sheet for Patients:  BloggerCourse.com  Fact Sheet for Healthcare Providers:  SeriousBroker.it  This test is no t yet approved or cleared by the [place] FDA and  has been authorized for detection and/or diagnosis of SARS-CoV-2 by FDA under an Emergency Use Authorization (EUA). This EUA will remain  in effect (meaning this test can be used) for the duration of the COVID-19 declaration under Section 564(b)(1) of the Act, 21 U.S.C.section 360bbb-3(b)(1), unless the authorization is terminated  or revoked sooner.       Influenza A by PCR NEGATIVE NEGATIVE Final   Influenza B by PCR NEGATIVE NEGATIVE Final    Comment: (NOTE) The Xpert Xpress SARS-CoV-2/FLU/RSV plus assay is intended as an aid in the diagnosis of influenza from Nasopharyngeal swab specimens and should not be used as a sole basis for  treatment. Nasal washings and aspirates are unacceptable for Xpert Xpress SARS-CoV-2/FLU/RSV testing.  Fact Sheet for Patients: BloggerCourse.com  Fact Sheet for Healthcare Providers: SeriousBroker.it  This test is not yet approved or cleared by the [place] FDA and has been authorized for detection and/or diagnosis of SARS-CoV-2 by FDA under an Emergency Use Authorization (EUA). This EUA will remain in effect (meaning this test can be used) for the duration of the COVID-19 declaration under Section 564(b)(1) of the Act, 21 U.S.C. section 360bbb-3(b)(1), unless the authorization is terminated or revoked.     Resp Syncytial Virus by PCR NEGATIVE NEGATIVE Final    Comment: (NOTE) Fact Sheet for Patients: BloggerCourse.com  Fact Sheet for Healthcare Providers: SeriousBroker.it  This test is not yet approved or cleared by the [place] FDA and has been authorized for detection and/or diagnosis of SARS-CoV-2 by FDA under an Emergency Use Authorization (EUA). This EUA will remain in effect (meaning this test can be used) for the duration of the COVID-19 declaration under Section 564(b)(1) of the Act, 21 U.S.C. section 360bbb-3(b)(1), unless the authorization is terminated or revoked.  Performed at [facility], [address] [place], [place] [address]   MRSA Next Gen by PCR, Nasal     Status: None   Collection Time: 11/26/22 11:57 AM   Specimen: Nasal Mucosa; Nasal Swab  Result Value Ref Range Status   MRSA by PCR Next Gen NOT DETECTED NOT DETECTED Final    Comment: (NOTE) The GeneXpert MRSA Assay (FDA approved for NASAL specimens only), is one component of a comprehensive MRSA colonization surveillance program. It is not intended to diagnose MRSA infection nor to guide or monitor treatment for MRSA infections. Test performance is not FDA approved in patients less than  66 years old. Performed at [facility], [address], [place], [place] [address]   C Difficile Quick Screen w PCR reflex     Status: None   Collection Time: 11/27/22  2:59 AM   Specimen: STOOL  Result Value Ref Range Status   C Diff antigen NEGATIVE NEGATIVE Final   C Diff toxin NEGATIVE NEGATIVE Final   C Diff interpretation No C. difficile detected.  Final    Comment: Performed at [facility], [address] [place], [place] [address]         Radiology Studies: CT ABDOMEN PELVIS WO CONTRAST  Result Date: 11/25/2022 CLINICAL DATA:  Weakness with fall onto right side 1 day prior to presentation. Recent vomiting and diarrhea. EXAM: CT ABDOMEN AND PELVIS WITHOUT CONTRAST TECHNIQUE: Multidetector CT imaging of the abdomen and pelvis was performed following the standard protocol without IV contrast. RADIATION DOSE REDUCTION: This exam was performed according to the departmental dose-optimization program which includes automated exposure control, adjustment of the mA and/or kV according to patient size and/or use of iterative reconstruction technique. COMPARISON:  CT abdomen and pelvis dated 06/01/2004, CTA chest dated 01/13/2008 FINDINGS: Lower chest: Medial right middle lobe 10 x 4 mm nodule (3:19), not substantially changed from 2009. No specific follow-up imaging recommended. No pleural effusion or pneumothorax demonstrated. Partially imaged heart size is normal. Coronary artery calcifications. Hepatobiliary: No focal hepatic lesions. No intra or extrahepatic biliary ductal dilation. Cholecystectomy. Pancreas: No focal lesions or main ductal dilation. Spleen: Normal in size without focal abnormality. Adrenals/Urinary Tract: No adrenal nodules. No suspicious renal mass on this noncontrast enhanced examination or calculi. Mild bilateral hydroureteronephrosis to the level of the distended urinary bladder. Markedly distended urinary bladder. No mural thickening. Stomach/Bowel: Normal appearance of  the stomach. No evidence of bowel wall thickening, distention, or inflammatory changes. Appendix is not discretely seen. Vascular/Lymphatic: Aortic atherosclerosis. No enlarged abdominal or pelvic lymph nodes. Reproductive: No adnexal masses. Other: No free fluid, fluid collection, or free air. Musculoskeletal: No acute or abnormal lytic or blastic osseous lesions. Multilevel degenerative changes of the partially imaged thoracic and lumbar spine. IMPRESSION: 1. Markedly distended urinary bladder with mild bilateral hydroureteronephrosis. Recommend correlation with urinary retention. 2. No other acute abdominopelvic findings. 3. Aortic Atherosclerosis (ICD10-I70.0). Coronary artery calcifications. Assessment for potential risk factor modification, dietary therapy or pharmacologic therapy may be warranted, if clinically indicated. Electronically Signed   By: Agustin Cree M.D.   On: 11/25/2022 18:49   DG Chest Port 1 View  Result Date: 11/25/2022 CLINICAL DATA:  Palpitations EXAM: PORTABLE CHEST 1 VIEW COMPARISON:  X-ray 05/07/2021 FINDINGS: Underinflation. There is some linear opacity at the bases likely scar or atelectasis. No consolidation, pneumothorax or effusion. No edema. Calcified aorta. Overlapping cardiac leads. Degenerative changes of the spine. IMPRESSION: Underinflation with some basilar atelectasis. Electronically Signed   By: Karen Kays M.D.   On: 11/25/2022 15:40        Scheduled Meds:  apixaban  5 mg Oral BID   Chlorhexidine Gluconate Cloth  6 each Topical Daily   insulin aspart  0-9 Units  Subcutaneous Q6H   metoprolol tartrate  12.5 mg Oral BID   montelukast  10 mg Oral QHS   Continuous Infusions:  cefTRIAXone (ROCEPHIN)  IV Stopped (11/26/22 1430)     LOS: 2 days    Time spent: 35 minutes    Jawad Wiacek Hoover Brunette, DO [facility]  If 7PM-7AM, please contact night-coverage [email or web address] 11/27/2022, 12:08 PM

## 2022-11-27 NOTE — Consult Note (Signed)
PHARMACY - ANTICOAGULATION CONSULT NOTE  Pharmacy Consult for heparin infusion Indication: atrial fibrillation  Allergies  Allergen Reactions   Ciprofloxacin Diarrhea and Nausea And Vomiting    stomach pain   Latex Itching   Sulfa Antibiotics Itching and Rash   Patient Measurements: Height: 5\' 3"  (160 cm) Weight: 112.3 kg (247 lb 9.2 oz) IBW/kg (Calculated) : 52.4 Heparin Dosing Weight: 80.3 kg  Vital Signs: Temp: 98.4 F (36.9 C) (10/05 0500) Temp Source: Oral (10/05 0500) BP: 144/74 (10/05 0600) Pulse Rate: 96 (10/05 0600)  Labs: Recent Labs    11/25/22 1317 11/25/22 1453 11/25/22 2337 11/26/22 0204 11/26/22 0529 11/26/22 0751 11/27/22 0501  HGB 11.8*  --   --   --  10.8*  --  9.7*  HCT 36.1  --   --   --  33.4*  --  30.1*  PLT 489*  --   --   --  461*  --  515*  APTT  --  27  --   --   --   --   --   LABPROT  --  14.9  --   --   --   --   --   INR  --  1.2  --   --   --   --   --   HEPARINUNFRC  --   --   --  0.31  --  0.33 0.10*  CREATININE 3.45*  --   --  2.58*  --   --   --   TROPONINIHS  --   --  10 10  --   --   --     Estimated Creatinine Clearance: 24.5 mL/min (A) (by C-G formula based on SCr of 2.58 mg/dL (H)).   Medical History: Past Medical History:  Diagnosis Date   Bipolar 1 disorder (HCC)    Cancer (HCC)    Diabetes mellitus without complication (HCC)    Hypertension    PONV (postoperative nausea and vomiting)    Stroke [facility])     Assessment: 71 yo female presented to ED with abdominal discomfort and diarrhea.  EKG in ED showed patient to be in Afib.  Pharmacy consulted for initiation of heparin infusion.  Heparin level low this morning at 0.1, Rn reported infusion off briefly yesterday but that shouldn't affect level this am. Will titrate infusion to goal. No bleeding issues noted however hgb declining.   Goal of Therapy:  Heparin level 0.3-0.7 units/ml Monitor platelets by anticoagulation protocol: Yes   Plan:  Increase heparin to  1400 units/hr Recheck heparin level in 8 hours Heparin level daily with CBC  Sheppard Coil PharmD., BCPS Clinical Pharmacist 11/27/2022 7:51 AM

## 2022-11-27 NOTE — Progress Notes (Signed)
Patient's heart rate continues to be in the 140's MD on call made aware.

## 2022-11-27 NOTE — Progress Notes (Signed)
Was approached this morning by night RN that patient needed a regular O'Brien and that ICU was out; stated patient was having a few apneic moments when she slept and needed a little support.  Went and got a couple and brought to ICU and placed patient on 2L. Current sat is 94%.

## 2022-11-28 DIAGNOSIS — I4891 Unspecified atrial fibrillation: Secondary | ICD-10-CM | POA: Diagnosis not present

## 2022-11-28 LAB — GLUCOSE, CAPILLARY
Glucose-Capillary: 139 mg/dL — ABNORMAL HIGH (ref 70–99)
Glucose-Capillary: 146 mg/dL — ABNORMAL HIGH (ref 70–99)
Glucose-Capillary: 156 mg/dL — ABNORMAL HIGH (ref 70–99)
Glucose-Capillary: 123 mg/dL — ABNORMAL HIGH (ref 70–99)

## 2022-11-28 LAB — COMPREHENSIVE METABOLIC PANEL
ALT: 47 U/L — ABNORMAL HIGH (ref 0–44)
AST: 47 U/L — ABNORMAL HIGH (ref 15–41)
Albumin: 2.4 g/dL — ABNORMAL LOW (ref 3.5–5.0)
Alkaline Phosphatase: 105 U/L (ref 38–126)
Anion gap: 10 (ref 5–15)
BUN: 19 mg/dL (ref 8–23)
CO2: 19 mmol/L — ABNORMAL LOW (ref 22–32)
Calcium: 8.7 mg/dL — ABNORMAL LOW (ref 8.9–10.3)
Chloride: 106 mmol/L (ref 98–111)
Creatinine, Ser: 1.38 mg/dL — ABNORMAL HIGH (ref 0.44–1.00)
GFR, Estimated: 41 mL/min — ABNORMAL LOW (ref >60)
Glucose, Bld: 155 mg/dL — ABNORMAL HIGH (ref 70–99)
Potassium: 3.8 mmol/L (ref 3.5–5.1)
Sodium: 135 mmol/L (ref 135–145)
Total Bilirubin: 0.8 mg/dL (ref 0.3–1.2)
Total Protein: 6.5 g/dL (ref 6.5–8.1)

## 2022-11-28 LAB — CBC
HCT: 32 % — ABNORMAL LOW (ref 36.0–46.0)
Hemoglobin: 10.6 g/dL — ABNORMAL LOW (ref 12.0–15.0)
MCH: 27.7 pg (ref 26.0–34.0)
MCHC: 33.1 g/dL (ref 30.0–36.0)
MCV: 83.6 fL (ref 80.0–100.0)
Platelets: 609 10*3/uL — ABNORMAL HIGH (ref 150–400)
RBC: 3.83 MIL/uL — ABNORMAL LOW (ref 3.87–5.11)
RDW: 14.7 % (ref 11.5–15.5)
WBC: 12.8 10*3/uL — ABNORMAL HIGH (ref 4.0–10.5)
nRBC: 0 % (ref 0.0–0.2)

## 2022-11-28 LAB — CULTURE, BLOOD (ROUTINE X 2): Special Requests: ADEQUATE

## 2022-11-28 LAB — GASTROINTESTINAL PANEL BY PCR, STOOL (REPLACES STOOL CULTURE)

## 2022-11-28 LAB — MAGNESIUM: Magnesium: 1.6 mg/dL — ABNORMAL LOW (ref 1.7–2.4)

## 2022-11-28 MED ORDER — DILTIAZEM HCL 30 MG PO TABS
30.0000 mg | ORAL_TABLET | Freq: Three times a day (TID) | ORAL | Status: DC
  Administered 2022-11-28 – 2022-11-29 (×3): 30 mg via ORAL
  Filled 2022-11-28 (×3): qty 1

## 2022-11-28 MED ORDER — DILTIAZEM HCL 25 MG/5ML IV SOLN
10.0000 mg | Freq: Once | INTRAVENOUS | Status: AC
  Administered 2022-11-28: 10 mg via INTRAVENOUS
  Filled 2022-11-28: qty 5

## 2022-11-28 MED ORDER — METOPROLOL TARTRATE 25 MG PO TABS
25.0000 mg | ORAL_TABLET | Freq: Two times a day (BID) | ORAL | Status: DC
  Administered 2022-11-28 – 2022-11-29 (×2): 25 mg via ORAL
  Filled 2022-11-28 (×2): qty 1

## 2022-11-28 MED ORDER — DILTIAZEM HCL 25 MG/5ML IV SOLN
5.0000 mg | Freq: Once | INTRAVENOUS | Status: AC
  Administered 2022-11-28: 5 mg via INTRAVENOUS
  Filled 2022-11-28: qty 5

## 2022-11-28 MED ORDER — MAGNESIUM SULFATE 2 GM/50ML IV SOLN
2.0000 g | Freq: Once | INTRAVENOUS | Status: AC
  Administered 2022-11-28: 2 g via INTRAVENOUS
  Filled 2022-11-28: qty 50

## 2022-11-28 NOTE — Progress Notes (Signed)
Patients HR continues to be in the 130;s to 140's she c/o feeling nauseated and lightheaded ,  Notified Dr. Sherryll Burger as patient does not have PRN order for a. Fib , she consumed her PO metoprolol .   11/28/22 0939  Assess: MEWS Score  BP (!) 139/123  MAP (mmHg) 130  Pulse Rate (!) 140  Level of Consciousness Alert  SpO2 97 %  Assess: MEWS Score  MEWS Temp 0  MEWS Systolic 0  MEWS Pulse 3  MEWS RR 2  MEWS LOC 0  MEWS Score 5  MEWS Score Color Red  Assess: if the MEWS score is Yellow or Red  Were vital signs accurate and taken at a resting state? Yes  Does the patient meet 2 or more of the SIRS criteria? Yes  Does the patient have a confirmed or suspected source of infection? Yes  MEWS guidelines implemented  Yes, red  Treat  MEWS Interventions Considered administering scheduled or prn medications/treatments as ordered  Take Vital Signs  Increase Vital Sign Frequency  Red: Q1hr x2, continue Q4hrs until patient remains green for 12hrs  Escalate  MEWS: Escalate Red: Discuss with charge nurse and notify provider. Consider notifying RRT. If remains red for 2 hours consider need for higher level of care  Notify: Charge Nurse/RN  Name of Charge Nurse/RN Notified Chales Abrahams, RN  Provider Notification  Provider Name/Title Sherryll Burger  Date Provider Notified 11/28/22  Time Provider Notified [phone]  Method of Notification Page  Notification Reason Other (Comment) (A. fib that is in the 140's)  Assess: SIRS CRITERIA  SIRS Temperature  0  SIRS Pulse 1  SIRS Respirations  1  SIRS WBC 1  SIRS Score Sum  3

## 2022-11-28 NOTE — Progress Notes (Signed)
63.0 in Accession #:    [id number]       Weight:       247.6 lb Date of Birth:  Sep 22, 1950         BSA:          2.117 m Patient Age:    72 years         BP:           132/65 mmHg Patient Gender: F                HR:           89 bpm. Exam [facility] Procedure: 2D Echo, Cardiac Doppler, Color Doppler and Intracardiac            Opacification Agent Indications:    A fib I48.91  History:        Patient has prior history of Echocardiogram examinations, most                 recent 07/31/2022. Stroke; Risk Factors:Hypertension and Diabetes.  Sonographer:    Harriette Bouillon RDCS Referring Phys: [phone] Heloise Beecham EMOKPAE IMPRESSIONS  1. Left ventricular ejection fraction, by estimation, is 60 to 65%. The left ventricle has normal function. The left ventricle has no regional wall motion abnormalities. Left ventricular diastolic parameters were normal.  2. Right ventricular systolic function is normal. The right ventricular size is normal.  3. The mitral valve is normal in structure. No evidence of mitral valve regurgitation. No evidence of  mitral stenosis.  4. The aortic valve is tricuspid. Aortic valve regurgitation is not visualized. No aortic stenosis is present.  5. The inferior vena cava is normal in size with greater than 50% respiratory variability, suggesting right atrial pressure of 3 mmHg. FINDINGS  Left Ventricle: Left ventricular ejection fraction, by estimation, is 60 to 65%. The left ventricle has normal function. The left ventricle has no regional wall motion abnormalities. Definity contrast agent was given IV to delineate the left ventricular  endocardial borders. The left ventricular internal cavity size was normal in size. There is no left ventricular hypertrophy. Left ventricular diastolic parameters were normal. Right Ventricle: The right ventricular size is normal. No increase in right ventricular wall thickness. Right ventricular systolic function is normal. Left Atrium: Left atrial size was normal in size. Right Atrium: Right atrial size was normal in size. Pericardium: There is no evidence of pericardial effusion. Mitral Valve: The mitral valve is normal in structure. No evidence of mitral valve regurgitation. No evidence of mitral valve stenosis. Tricuspid Valve: The tricuspid valve is normal in structure. Tricuspid valve regurgitation is not demonstrated. No evidence of tricuspid stenosis. Aortic Valve: The aortic valve is tricuspid. Aortic valve regurgitation is not visualized. No aortic stenosis is present. Pulmonic Valve: The pulmonic valve was normal in structure. Pulmonic valve regurgitation is not visualized. No evidence of pulmonic stenosis. Aorta: The aortic root is normal in size and structure. Venous: The inferior vena cava is normal in size with greater than 50% respiratory variability, suggesting right atrial pressure of 3 mmHg. IAS/Shunts: No atrial level shunt detected by color flow Doppler.  LEFT VENTRICLE PLAX 2D LVIDd:         4.20 cm   Diastology LVIDs:         3.30 cm   LV e' medial:    6.42 cm/s LV PW:          1.10 cm   LV E/e' medial:  12.6 LV IVS:  63.0 in Accession #:    [id number]       Weight:       247.6 lb Date of Birth:  Sep 22, 1950         BSA:          2.117 m Patient Age:    72 years         BP:           132/65 mmHg Patient Gender: F                HR:           89 bpm. [facility] Procedure: 2D Echo, Cardiac Doppler, Color Doppler and Intracardiac            Opacification Agent Indications:    A fib I48.91  History:        Patient has prior history of Echocardiogram examinations, most                 recent 07/31/2022. Stroke; Risk Factors:Hypertension and Diabetes.  Sonographer:    Harriette Bouillon RDCS Referring Phys: [phone] Heloise Beecham EMOKPAE IMPRESSIONS  1. Left ventricular ejection fraction, by estimation, is 60 to 65%. The left ventricle has normal function. The left ventricle has no regional wall motion abnormalities. Left ventricular diastolic parameters were normal.  2. Right ventricular systolic function is normal. The right ventricular size is normal.  3. The mitral valve is normal in structure. No evidence of mitral valve regurgitation. No evidence of  mitral stenosis.  4. The aortic valve is tricuspid. Aortic valve regurgitation is not visualized. No aortic stenosis is present.  5. The inferior vena cava is normal in size with greater than 50% respiratory variability, suggesting right atrial pressure of 3 mmHg. FINDINGS  Left Ventricle: Left ventricular ejection fraction, by estimation, is 60 to 65%. The left ventricle has normal function. The left ventricle has no regional wall motion abnormalities. Definity contrast agent was given IV to delineate the left ventricular  endocardial borders. The left ventricular internal cavity size was normal in size. There is no left ventricular hypertrophy. Left ventricular diastolic parameters were normal. Right Ventricle: The right ventricular size is normal. No increase in right ventricular wall thickness. Right ventricular systolic function is normal. Left Atrium: Left atrial size was normal in size. Right Atrium: Right atrial size was normal in size. Pericardium: There is no evidence of pericardial effusion. Mitral Valve: The mitral valve is normal in structure. No evidence of mitral valve regurgitation. No evidence of mitral valve stenosis. Tricuspid Valve: The tricuspid valve is normal in structure. Tricuspid valve regurgitation is not demonstrated. No evidence of tricuspid stenosis. Aortic Valve: The aortic valve is tricuspid. Aortic valve regurgitation is not visualized. No aortic stenosis is present. Pulmonic Valve: The pulmonic valve was normal in structure. Pulmonic valve regurgitation is not visualized. No evidence of pulmonic stenosis. Aorta: The aortic root is normal in size and structure. Venous: The inferior vena cava is normal in size with greater than 50% respiratory variability, suggesting right atrial pressure of 3 mmHg. IAS/Shunts: No atrial level shunt detected by color flow Doppler.  LEFT VENTRICLE PLAX 2D LVIDd:         4.20 cm   Diastology LVIDs:         3.30 cm   LV e' medial:    6.42 cm/s LV PW:          1.10 cm   LV E/e' medial:  12.6 LV IVS:  PROGRESS NOTE    [name]  ZOX:[id number] DOB: 1950/04/29 DOA: 11/25/2022 PCP: [facility]   Brief Narrative:    [name] is a 72 y.o. female with medical history significant for hypertension, diabetes mellitus, bipolar disorder, stroke, morbid obesity. Patient presented to the ED with complaints of multiple episodes/continuous diarrhea that started yesterday till this morning with associated abdominal pain.  She reports about 3 days of vomiting that has now resolved but this occurred about 2 days prior to onset of diarrhea.   She reports palpitations also, with generalized weakness.  Patient was admitted with atrial fibrillation with RVR in the setting of severe sepsis with UTI and diarrhea.  She is also noted to have AKI along with acute urinary retention.  Now with some E. coli bacteremia noted.  Assessment & Plan:   Principal Problem:   Atrial fibrillation with RVR (HCC) Active Problems:   Severe sepsis (HCC)   UTI (urinary tract infection)   Acute urinary retention   AKI (acute kidney injury) (HCC)   Prolonged QT interval   Diarrhea   Morbid obesity (HCC)   Essential hypertension   Bipolar 1 disorder (HCC)   Diabetes mellitus without complication (HCC)   Stroke (HCC)  Assessment and Plan:   Atrial fibrillation with RVR (HCC)-now in sinus rhythm Persistently elevated heart rate 138- 152, despite amiodarone bolus and drip, over 3 hours ago.  No history of atrial fibrillation. CHAd2Vasc score- at least 6, for age,sex, stroke diabetes and hypertensive history.   Atria fibrillation likely driven by severe sepsis. -Discontinue amiodarone drip as patient is now in sinus rhythm -Continue home metoprolol -Trend troponin-flat -TSH 0.277, free T4 0.9 -Appreciate cardiology evaluation with resumption of home metoprolol -Echocardiogram 10/5 with LVEF 60-65% and no acute abnormalities   AKI (acute kidney injury) (HCC)-improving Creatinine 3.45, markedly  elevated from baseline ~1.  AKI in the setting of severe sepsis,dehydration from GI losses, on ARB's- irbesartan, and HCTZ, also possibly some post-renal component considering mild bilateral hydroureteronephros on CT. - Hydrate  - Foley -Hold irbesartan and hydrochlorothiazide -Continue to monitor in a.m.   Acute urinary retention CT abdomen and pelvis showing- Markedly distended urinary bladder with mild bilateral hydroureteronephrosis.  Patient was able to void large amount in the ED.  Subsequently, Bladder scan in the ED, showed 1.2 L of urine. -Currently resolved and Foley catheter discontinued 10/5   Severe sepsis (HCC) Severe sepsis secondary to UTI with E. coli bacteremia.  Meeting sepsis criteria with tachycardia heart rate 138 -152, tachypnea respiratory 23-34, evidence of endorgan dysfunction, kidney injury and lactic acidosis 1.4 > 2.  Possibly some gastroenteritis/Diarrhea also with resultant dehydration..  CT abdomen and pelvis -suggesting urinary retention otherwise negative for other acute abdominal pelvic findings. -Follow-up Urine cultures with noted E. coli, sensitive to ceftriaxone.  Anticipate cefadroxil on discharge -Blood cultures with 1/4 bottles with E. coli -cefepime and metronidazole started in the ED, will continue with IV ceftriaxone - Additional 500 mL bolus given, totaling 4 L bolus - N/s 100cc/hr x 1 day -Trend lactic acid-resolved to 1.3   Diarrhea-improved - Stool c.diff negative -GI panel negative     Prolonged QT interval Qt interval prolonged at 513. K- 3.5. -Check Magnesium - Replete K   Stroke (HCC) Hold Plavix while on Eliquis   Diabetes mellitus without complication (HCC) Controlled.  A1c 6.4. -Home Lantus 18 units at bedtime, for now with poor oral intake -Hold Metformin - SSI- M   Essential hypertension  63.0 in Accession #:    [id number]       Weight:       247.6 lb Date of Birth:  Sep 22, 1950         BSA:          2.117 m Patient Age:    72 years         BP:           132/65 mmHg Patient Gender: F                HR:           89 bpm. [facility] Procedure: 2D Echo, Cardiac Doppler, Color Doppler and Intracardiac            Opacification Agent Indications:    A fib I48.91  History:        Patient has prior history of Echocardiogram examinations, most                 recent 07/31/2022. Stroke; Risk Factors:Hypertension and Diabetes.  Sonographer:    Harriette Bouillon RDCS Referring Phys: [phone] Heloise Beecham EMOKPAE IMPRESSIONS  1. Left ventricular ejection fraction, by estimation, is 60 to 65%. The left ventricle has normal function. The left ventricle has no regional wall motion abnormalities. Left ventricular diastolic parameters were normal.  2. Right ventricular systolic function is normal. The right ventricular size is normal.  3. The mitral valve is normal in structure. No evidence of mitral valve regurgitation. No evidence of  mitral stenosis.  4. The aortic valve is tricuspid. Aortic valve regurgitation is not visualized. No aortic stenosis is present.  5. The inferior vena cava is normal in size with greater than 50% respiratory variability, suggesting right atrial pressure of 3 mmHg. FINDINGS  Left Ventricle: Left ventricular ejection fraction, by estimation, is 60 to 65%. The left ventricle has normal function. The left ventricle has no regional wall motion abnormalities. Definity contrast agent was given IV to delineate the left ventricular  endocardial borders. The left ventricular internal cavity size was normal in size. There is no left ventricular hypertrophy. Left ventricular diastolic parameters were normal. Right Ventricle: The right ventricular size is normal. No increase in right ventricular wall thickness. Right ventricular systolic function is normal. Left Atrium: Left atrial size was normal in size. Right Atrium: Right atrial size was normal in size. Pericardium: There is no evidence of pericardial effusion. Mitral Valve: The mitral valve is normal in structure. No evidence of mitral valve regurgitation. No evidence of mitral valve stenosis. Tricuspid Valve: The tricuspid valve is normal in structure. Tricuspid valve regurgitation is not demonstrated. No evidence of tricuspid stenosis. Aortic Valve: The aortic valve is tricuspid. Aortic valve regurgitation is not visualized. No aortic stenosis is present. Pulmonic Valve: The pulmonic valve was normal in structure. Pulmonic valve regurgitation is not visualized. No evidence of pulmonic stenosis. Aorta: The aortic root is normal in size and structure. Venous: The inferior vena cava is normal in size with greater than 50% respiratory variability, suggesting right atrial pressure of 3 mmHg. IAS/Shunts: No atrial level shunt detected by color flow Doppler.  LEFT VENTRICLE PLAX 2D LVIDd:         4.20 cm   Diastology LVIDs:         3.30 cm   LV e' medial:    6.42 cm/s LV PW:          1.10 cm   LV E/e' medial:  12.6 LV IVS:  PROGRESS NOTE    [name]  ZOX:[id number] DOB: 1950/04/29 DOA: 11/25/2022 PCP: [facility]   Brief Narrative:    [name] is a 72 y.o. female with medical history significant for hypertension, diabetes mellitus, bipolar disorder, stroke, morbid obesity. Patient presented to the ED with complaints of multiple episodes/continuous diarrhea that started yesterday till this morning with associated abdominal pain.  She reports about 3 days of vomiting that has now resolved but this occurred about 2 days prior to onset of diarrhea.   She reports palpitations also, with generalized weakness.  Patient was admitted with atrial fibrillation with RVR in the setting of severe sepsis with UTI and diarrhea.  She is also noted to have AKI along with acute urinary retention.  Now with some E. coli bacteremia noted.  Assessment & Plan:   Principal Problem:   Atrial fibrillation with RVR (HCC) Active Problems:   Severe sepsis (HCC)   UTI (urinary tract infection)   Acute urinary retention   AKI (acute kidney injury) (HCC)   Prolonged QT interval   Diarrhea   Morbid obesity (HCC)   Essential hypertension   Bipolar 1 disorder (HCC)   Diabetes mellitus without complication (HCC)   Stroke (HCC)  Assessment and Plan:   Atrial fibrillation with RVR (HCC)-now in sinus rhythm Persistently elevated heart rate 138- 152, despite amiodarone bolus and drip, over 3 hours ago.  No history of atrial fibrillation. CHAd2Vasc score- at least 6, for age,sex, stroke diabetes and hypertensive history.   Atria fibrillation likely driven by severe sepsis. -Discontinue amiodarone drip as patient is now in sinus rhythm -Continue home metoprolol -Trend troponin-flat -TSH 0.277, free T4 0.9 -Appreciate cardiology evaluation with resumption of home metoprolol -Echocardiogram 10/5 with LVEF 60-65% and no acute abnormalities   AKI (acute kidney injury) (HCC)-improving Creatinine 3.45, markedly  elevated from baseline ~1.  AKI in the setting of severe sepsis,dehydration from GI losses, on ARB's- irbesartan, and HCTZ, also possibly some post-renal component considering mild bilateral hydroureteronephros on CT. - Hydrate  - Foley -Hold irbesartan and hydrochlorothiazide -Continue to monitor in a.m.   Acute urinary retention CT abdomen and pelvis showing- Markedly distended urinary bladder with mild bilateral hydroureteronephrosis.  Patient was able to void large amount in the ED.  Subsequently, Bladder scan in the ED, showed 1.2 L of urine. -Currently resolved and Foley catheter discontinued 10/5   Severe sepsis (HCC) Severe sepsis secondary to UTI with E. coli bacteremia.  Meeting sepsis criteria with tachycardia heart rate 138 -152, tachypnea respiratory 23-34, evidence of endorgan dysfunction, kidney injury and lactic acidosis 1.4 > 2.  Possibly some gastroenteritis/Diarrhea also with resultant dehydration..  CT abdomen and pelvis -suggesting urinary retention otherwise negative for other acute abdominal pelvic findings. -Follow-up Urine cultures with noted E. coli, sensitive to ceftriaxone.  Anticipate cefadroxil on discharge -Blood cultures with 1/4 bottles with E. coli -cefepime and metronidazole started in the ED, will continue with IV ceftriaxone - Additional 500 mL bolus given, totaling 4 L bolus - N/s 100cc/hr x 1 day -Trend lactic acid-resolved to 1.3   Diarrhea-improved - Stool c.diff negative -GI panel negative     Prolonged QT interval Qt interval prolonged at 513. K- 3.5. -Check Magnesium - Replete K   Stroke (HCC) Hold Plavix while on Eliquis   Diabetes mellitus without complication (HCC) Controlled.  A1c 6.4. -Home Lantus 18 units at bedtime, for now with poor oral intake -Hold Metformin - SSI- M   Essential hypertension  PROGRESS NOTE    [name]  ZOX:[id number] DOB: 1950/04/29 DOA: 11/25/2022 PCP: [facility]   Brief Narrative:    [name] is a 72 y.o. female with medical history significant for hypertension, diabetes mellitus, bipolar disorder, stroke, morbid obesity. Patient presented to the ED with complaints of multiple episodes/continuous diarrhea that started yesterday till this morning with associated abdominal pain.  She reports about 3 days of vomiting that has now resolved but this occurred about 2 days prior to onset of diarrhea.   She reports palpitations also, with generalized weakness.  Patient was admitted with atrial fibrillation with RVR in the setting of severe sepsis with UTI and diarrhea.  She is also noted to have AKI along with acute urinary retention.  Now with some E. coli bacteremia noted.  Assessment & Plan:   Principal Problem:   Atrial fibrillation with RVR (HCC) Active Problems:   Severe sepsis (HCC)   UTI (urinary tract infection)   Acute urinary retention   AKI (acute kidney injury) (HCC)   Prolonged QT interval   Diarrhea   Morbid obesity (HCC)   Essential hypertension   Bipolar 1 disorder (HCC)   Diabetes mellitus without complication (HCC)   Stroke (HCC)  Assessment and Plan:   Atrial fibrillation with RVR (HCC)-now in sinus rhythm Persistently elevated heart rate 138- 152, despite amiodarone bolus and drip, over 3 hours ago.  No history of atrial fibrillation. CHAd2Vasc score- at least 6, for age,sex, stroke diabetes and hypertensive history.   Atria fibrillation likely driven by severe sepsis. -Discontinue amiodarone drip as patient is now in sinus rhythm -Continue home metoprolol -Trend troponin-flat -TSH 0.277, free T4 0.9 -Appreciate cardiology evaluation with resumption of home metoprolol -Echocardiogram 10/5 with LVEF 60-65% and no acute abnormalities   AKI (acute kidney injury) (HCC)-improving Creatinine 3.45, markedly  elevated from baseline ~1.  AKI in the setting of severe sepsis,dehydration from GI losses, on ARB's- irbesartan, and HCTZ, also possibly some post-renal component considering mild bilateral hydroureteronephros on CT. - Hydrate  - Foley -Hold irbesartan and hydrochlorothiazide -Continue to monitor in a.m.   Acute urinary retention CT abdomen and pelvis showing- Markedly distended urinary bladder with mild bilateral hydroureteronephrosis.  Patient was able to void large amount in the ED.  Subsequently, Bladder scan in the ED, showed 1.2 L of urine. -Currently resolved and Foley catheter discontinued 10/5   Severe sepsis (HCC) Severe sepsis secondary to UTI with E. coli bacteremia.  Meeting sepsis criteria with tachycardia heart rate 138 -152, tachypnea respiratory 23-34, evidence of endorgan dysfunction, kidney injury and lactic acidosis 1.4 > 2.  Possibly some gastroenteritis/Diarrhea also with resultant dehydration..  CT abdomen and pelvis -suggesting urinary retention otherwise negative for other acute abdominal pelvic findings. -Follow-up Urine cultures with noted E. coli, sensitive to ceftriaxone.  Anticipate cefadroxil on discharge -Blood cultures with 1/4 bottles with E. coli -cefepime and metronidazole started in the ED, will continue with IV ceftriaxone - Additional 500 mL bolus given, totaling 4 L bolus - N/s 100cc/hr x 1 day -Trend lactic acid-resolved to 1.3   Diarrhea-improved - Stool c.diff negative -GI panel negative     Prolonged QT interval Qt interval prolonged at 513. K- 3.5. -Check Magnesium - Replete K   Stroke (HCC) Hold Plavix while on Eliquis   Diabetes mellitus without complication (HCC) Controlled.  A1c 6.4. -Home Lantus 18 units at bedtime, for now with poor oral intake -Hold Metformin - SSI- M   Essential hypertension  63.0 in Accession #:    [id number]       Weight:       247.6 lb Date of Birth:  Sep 22, 1950         BSA:          2.117 m Patient Age:    72 years         BP:           132/65 mmHg Patient Gender: F                HR:           89 bpm. [facility] Procedure: 2D Echo, Cardiac Doppler, Color Doppler and Intracardiac            Opacification Agent Indications:    A fib I48.91  History:        Patient has prior history of Echocardiogram examinations, most                 recent 07/31/2022. Stroke; Risk Factors:Hypertension and Diabetes.  Sonographer:    Harriette Bouillon RDCS Referring Phys: [phone] Heloise Beecham EMOKPAE IMPRESSIONS  1. Left ventricular ejection fraction, by estimation, is 60 to 65%. The left ventricle has normal function. The left ventricle has no regional wall motion abnormalities. Left ventricular diastolic parameters were normal.  2. Right ventricular systolic function is normal. The right ventricular size is normal.  3. The mitral valve is normal in structure. No evidence of mitral valve regurgitation. No evidence of  mitral stenosis.  4. The aortic valve is tricuspid. Aortic valve regurgitation is not visualized. No aortic stenosis is present.  5. The inferior vena cava is normal in size with greater than 50% respiratory variability, suggesting right atrial pressure of 3 mmHg. FINDINGS  Left Ventricle: Left ventricular ejection fraction, by estimation, is 60 to 65%. The left ventricle has normal function. The left ventricle has no regional wall motion abnormalities. Definity contrast agent was given IV to delineate the left ventricular  endocardial borders. The left ventricular internal cavity size was normal in size. There is no left ventricular hypertrophy. Left ventricular diastolic parameters were normal. Right Ventricle: The right ventricular size is normal. No increase in right ventricular wall thickness. Right ventricular systolic function is normal. Left Atrium: Left atrial size was normal in size. Right Atrium: Right atrial size was normal in size. Pericardium: There is no evidence of pericardial effusion. Mitral Valve: The mitral valve is normal in structure. No evidence of mitral valve regurgitation. No evidence of mitral valve stenosis. Tricuspid Valve: The tricuspid valve is normal in structure. Tricuspid valve regurgitation is not demonstrated. No evidence of tricuspid stenosis. Aortic Valve: The aortic valve is tricuspid. Aortic valve regurgitation is not visualized. No aortic stenosis is present. Pulmonic Valve: The pulmonic valve was normal in structure. Pulmonic valve regurgitation is not visualized. No evidence of pulmonic stenosis. Aorta: The aortic root is normal in size and structure. Venous: The inferior vena cava is normal in size with greater than 50% respiratory variability, suggesting right atrial pressure of 3 mmHg. IAS/Shunts: No atrial level shunt detected by color flow Doppler.  LEFT VENTRICLE PLAX 2D LVIDd:         4.20 cm   Diastology LVIDs:         3.30 cm   LV e' medial:    6.42 cm/s LV PW:          1.10 cm   LV E/e' medial:  12.6 LV IVS:  63.0 in Accession #:    [id number]       Weight:       247.6 lb Date of Birth:  Sep 22, 1950         BSA:          2.117 m Patient Age:    72 years         BP:           132/65 mmHg Patient Gender: F                HR:           89 bpm. [facility] Procedure: 2D Echo, Cardiac Doppler, Color Doppler and Intracardiac            Opacification Agent Indications:    A fib I48.91  History:        Patient has prior history of Echocardiogram examinations, most                 recent 07/31/2022. Stroke; Risk Factors:Hypertension and Diabetes.  Sonographer:    Harriette Bouillon RDCS Referring Phys: [phone] Heloise Beecham EMOKPAE IMPRESSIONS  1. Left ventricular ejection fraction, by estimation, is 60 to 65%. The left ventricle has normal function. The left ventricle has no regional wall motion abnormalities. Left ventricular diastolic parameters were normal.  2. Right ventricular systolic function is normal. The right ventricular size is normal.  3. The mitral valve is normal in structure. No evidence of mitral valve regurgitation. No evidence of  mitral stenosis.  4. The aortic valve is tricuspid. Aortic valve regurgitation is not visualized. No aortic stenosis is present.  5. The inferior vena cava is normal in size with greater than 50% respiratory variability, suggesting right atrial pressure of 3 mmHg. FINDINGS  Left Ventricle: Left ventricular ejection fraction, by estimation, is 60 to 65%. The left ventricle has normal function. The left ventricle has no regional wall motion abnormalities. Definity contrast agent was given IV to delineate the left ventricular  endocardial borders. The left ventricular internal cavity size was normal in size. There is no left ventricular hypertrophy. Left ventricular diastolic parameters were normal. Right Ventricle: The right ventricular size is normal. No increase in right ventricular wall thickness. Right ventricular systolic function is normal. Left Atrium: Left atrial size was normal in size. Right Atrium: Right atrial size was normal in size. Pericardium: There is no evidence of pericardial effusion. Mitral Valve: The mitral valve is normal in structure. No evidence of mitral valve regurgitation. No evidence of mitral valve stenosis. Tricuspid Valve: The tricuspid valve is normal in structure. Tricuspid valve regurgitation is not demonstrated. No evidence of tricuspid stenosis. Aortic Valve: The aortic valve is tricuspid. Aortic valve regurgitation is not visualized. No aortic stenosis is present. Pulmonic Valve: The pulmonic valve was normal in structure. Pulmonic valve regurgitation is not visualized. No evidence of pulmonic stenosis. Aorta: The aortic root is normal in size and structure. Venous: The inferior vena cava is normal in size with greater than 50% respiratory variability, suggesting right atrial pressure of 3 mmHg. IAS/Shunts: No atrial level shunt detected by color flow Doppler.  LEFT VENTRICLE PLAX 2D LVIDd:         4.20 cm   Diastology LVIDs:         3.30 cm   LV e' medial:    6.42 cm/s LV PW:          1.10 cm   LV E/e' medial:  12.6 LV IVS:

## 2022-11-28 NOTE — Progress Notes (Signed)
   11/28/22 1135  Vitals  BP (!) 128/92  Pulse Rate (!) 110  Pulse Rate Source Dinamap  MEWS COLOR  MEWS Score Color Yellow  MEWS Score  MEWS Temp 0  MEWS Systolic 0  MEWS Pulse 1  MEWS RR 1  MEWS LOC 0  MEWS Score 2

## 2022-11-28 NOTE — Evaluation (Signed)
Physical Therapy Evaluation Patient Details Name: [name] MRN: [id number] DOB: January 14, 1951 Today's Date: 11/28/2022  History of Present Illness  Expand All Collapse All    untitled image  History and Physical         [name] GEX:[id number] DOB: [birth year] DOA: 11/25/2022     PCP: Ponciano Ort [facility]      Patient coming from: Home      I have personally briefly reviewed patient's old medical records in [name]     Chief Complaint: Diarrhea, vomiting, abdominal pain.     HPI: [name] is a 72 y.o. female with medical history significant for hypertension, diabetes mellitus, bipolar disorder, stroke, morbid obesity.  Patient presented to the ED with complaints of multiple episodes/continuous diarrhea that started yesterday till this morning with associated abdominal pain.  She reports about 3 days of vomiting that has now resolved but this occurred about 2 days prior to onset of diarrhea.    She reports palpitations also, with generalized weakness.  She reports some mild chest soreness to the left side of her chest without pain.  No difficulty breathing.  She reports a diagnosis of atrial fibrillation in her 65s for which she was treated for.  Clinical Impression    PT ambulation is steady but pt states that she is dizzy throughout treatment. Pt nurse comes to room stating that pt HR is 160 , pt taken back to bed where her HR decreased to the 140's       If plan is discharge home, recommend the following: Help with stairs or ramp for entrance   Can travel by private vehicle    yes    Equipment Recommendations None recommended by PT  Recommendations for Other Services   none    Functional Status Assessment Patient has had a recent decline in their functional status and demonstrates the ability to make significant improvements in function in a reasonable and predictable amount of time.     Precautions / Restrictions Precautions Precautions: Fall Precaution  Comments: PT ambulation is steady but pt states that she is dizzy throughout treatment.  Pt nurse comes to room stating that pt HR is 160 , pt taken back to bed where her HR decreased to the 140's Restrictions Weight Bearing Restrictions: No      Mobility  Bed Mobility Overal bed mobility: Modified Independent                  Transfers Overall transfer level: Modified independent Equipment used: Rolling walker (2 wheels)                    Ambulation/Gait Ambulation/Gait assistance: Modified independent (Device/Increase time) Gait Distance (Feet): 20 Feet Assistive device: Rolling walker (2 wheels) Gait Pattern/deviations: Decreased step length - right, Decreased step length - left   Gait velocity interpretation: <1.31 ft/sec, indicative of household ambulator           Pertinent Vitals/Pain Pain Assessment Pain Assessment: 0-10 Pain Score: 4  Pain Location: abdominal Pain Descriptors / Indicators: Aching Pain Intervention(s): Limited activity within patient's tolerance    Home Living Family/patient expects to be discharged to:: Private residence Living Arrangements: Other relatives Available Help at Discharge: Family;Available 24 hours/day Type of Home: House Home Access: Stairs to enter Entrance Stairs-Rails: Right;Left Entrance Stairs-Number of Steps: 5 to 6     Home Equipment: Rolling Walker (2 wheels);Cane - single point;BSC/3in1;Tub bench      Prior Function  Prior Level of Function : Independent/Modified Independent;History of Falls (last six months)             Mobility Comments: [place] and short distance community ambulator with SPC; RW used if pt is having a rought day; uses scooter in grocery store. ADLs Comments: Independent ADL; has not done much cleaning, but does cook. Pt drives.     Extremity/Trunk Assessment        Lower Extremity Assessment Lower Extremity Assessment: Overall WFL for tasks assessed        Communication   Communication Communication: No apparent difficulties Cueing Techniques: Verbal cues  Cognition Arousal: Alert   Overall Cognitive Status: Within Functional Limits for tasks assessed                                                 Assessment/Plan    PT Assessment Patient needs continued PT services  PT Problem List Decreased strength;Decreased activity tolerance       PT Treatment Interventions Gait training;Therapeutic activities;Stair training    PT Goals (Current goals can be found in the Care Plan section)       Frequency Min 2X/week        AM-PAC PT "6 Clicks" Mobility  Outcome Measure Help needed turning from your back to your side while in a flat bed without using bedrails?: None Help needed moving from lying on your back to sitting on the side of a flat bed without using bedrails?: None Help needed moving to and from a bed to a chair (including a wheelchair)?: A Little Help needed standing up from a chair using your arms (e.g., wheelchair or bedside chair)?: None Help needed to walk in hospital room?: A Little Help needed climbing 3-5 steps with a railing? : A Little 6 Click Score: 21    End of Session Equipment Utilized During Treatment: Gait belt Activity Tolerance: Other (comment) (dizziness) Patient left: in bed;with call bell/phone within reach;with nursing/sitter in room Nurse Communication: Mobility status PT Visit Diagnosis: Other (comment);Dizziness and giddiness (R42);Muscle weakness (generalized) (M62.81) (decreased activitiy tolerance)    Time: 1056-1110 PT Time Calculation (min) (ACUTE ONLY): 14 min   Charges:   PT Evaluation $PT Eval Low Complexity: 1 Low   PT General Charges $$ ACUTE PT VISIT: 1 Visit    Virgina Organ, PT CLT [phone]     11/28/2022, 11:25 AM

## 2022-11-28 NOTE — Progress Notes (Signed)
When patient up walking around her HR went into the 160;s , assisted back to bed and VS obtained, her HR is now between 121-140's . She is alert and able to make her needs known.    11/28/22 1100  Vitals  Temp 97.8 F (36.6 C)  Temp Source Axillary  BP (!) 128/92  MAP (mmHg) 105  BP Location Left Arm  BP Method Automatic  Patient Position (if appropriate) Lying  Pulse Rate (!) 136  ECG Heart Rate (!) 124  Resp (!) 25  Level of Consciousness  Level of Consciousness Alert  MEWS COLOR  MEWS Score Color Yellow  Oxygen Therapy  SpO2 97 %  O2 Device Nasal Cannula  O2 Flow Rate (L/min) 2 L/min  MEWS Score  MEWS Temp 0  MEWS Systolic 0  MEWS Pulse 2  MEWS RR 1  MEWS LOC 0  MEWS Score 3

## 2022-11-28 NOTE — Progress Notes (Signed)
   11/28/22 0939  Vitals  BP (!) 139/123  MAP (mmHg) 130  BP Location Left Arm  BP Method Automatic  Patient Position (if appropriate) Sitting  Pulse Rate (!) 140  Level of Consciousness  Level of Consciousness Alert  Oxygen Therapy  SpO2 97 %

## 2022-11-28 NOTE — Plan of Care (Signed)
  Problem: Acute Rehab PT Goals(only PT should resolve) Goal: Pt Will Ambulate Flowsheets (Taken 11/28/2022 1123) Pt will Ambulate:  50 feet  with supervision  with rolling walker Goal: Pt Will Go Up/Down Stairs Flowsheets (Taken 11/28/2022 1123) Pt will Go Up / Down Stairs:  3-5 stairs  with minimal assist  with rail(s)

## 2022-11-28 NOTE — Progress Notes (Signed)
Patient continues to be a yellow mews   11/27/22 2100  Assess: MEWS Score  Pulse Rate (!) 123  Assess: MEWS Score  MEWS Temp 0  MEWS Systolic 0  MEWS Pulse 2  MEWS RR 0  MEWS LOC 0  MEWS Score 2  MEWS Score Color Yellow  Assess: if the MEWS score is Yellow or Red  Were vital signs accurate and taken at a resting state? Yes  Does the patient meet 2 or more of the SIRS criteria? Yes  Does the patient have a confirmed or suspected source of infection? Yes  MEWS guidelines implemented  No, previously yellow, continue vital signs every 4 hours  Notify: Charge Nurse/RN  Name of Charge Nurse/RN Notified Bree, RN  Provider Notification  Provider Name/Title Dr. Thomes Dinning  Date Provider Notified 11/28/22  Time Provider Notified 2056  Method of Notification Page (secure chat provider preference)  Notification Reason Change in status (continues to be a yellow mews)  Provider response In department;Other (Comment) (EKG performed)  Date of Provider Response 11/28/22  Time of Provider Response 2100  Assess: SIRS CRITERIA  SIRS Temperature  0  SIRS Pulse 1  SIRS Respirations  0  SIRS WBC 0  SIRS Score Sum  1

## 2022-11-28 NOTE — Progress Notes (Addendum)
Heart rate continues to be elevated after medication administration. MD on call made aware. Patient appears to be no distress at this time.

## 2022-11-28 NOTE — Progress Notes (Signed)
Provider came to the floor to see patient. New orders received. RN charge gave medication. Patient's heart rate remains elevated. Message sent to MD to make aware.

## 2022-11-29 DIAGNOSIS — N390 Urinary tract infection, site not specified: Secondary | ICD-10-CM | POA: Diagnosis not present

## 2022-11-29 DIAGNOSIS — B9620 Unspecified Escherichia coli [E. coli] as the cause of diseases classified elsewhere: Secondary | ICD-10-CM

## 2022-11-29 DIAGNOSIS — N1330 Unspecified hydronephrosis: Secondary | ICD-10-CM

## 2022-11-29 DIAGNOSIS — R7881 Bacteremia: Secondary | ICD-10-CM | POA: Diagnosis not present

## 2022-11-29 DIAGNOSIS — I4891 Unspecified atrial fibrillation: Secondary | ICD-10-CM | POA: Diagnosis not present

## 2022-11-29 LAB — COMPREHENSIVE METABOLIC PANEL
ALT: 38 U/L (ref 0–44)
AST: 33 U/L (ref 15–41)
Albumin: 2.2 g/dL — ABNORMAL LOW (ref 3.5–5.0)
Alkaline Phosphatase: 94 U/L (ref 38–126)
Anion gap: 12 (ref 5–15)
BUN: 14 mg/dL (ref 8–23)
CO2: 21 mmol/L — ABNORMAL LOW (ref 22–32)
Calcium: 8.4 mg/dL — ABNORMAL LOW (ref 8.9–10.3)
Chloride: 103 mmol/L (ref 98–111)
Creatinine, Ser: 1.21 mg/dL — ABNORMAL HIGH (ref 0.44–1.00)
GFR, Estimated: 48 mL/min — ABNORMAL LOW (ref >60)
Glucose, Bld: 119 mg/dL — ABNORMAL HIGH (ref 70–99)
Potassium: 3.5 mmol/L (ref 3.5–5.1)
Sodium: 136 mmol/L (ref 135–145)
Total Bilirubin: 0.6 mg/dL (ref 0.3–1.2)
Total Protein: 6.2 g/dL — ABNORMAL LOW (ref 6.5–8.1)

## 2022-11-29 LAB — CBC
HCT: 29.9 % — ABNORMAL LOW (ref 36.0–46.0)
Hemoglobin: 9.7 g/dL — ABNORMAL LOW (ref 12.0–15.0)
MCH: 27.7 pg (ref 26.0–34.0)
MCHC: 32.4 g/dL (ref 30.0–36.0)
MCV: 85.4 fL (ref 80.0–100.0)
Platelets: 642 10*3/uL — ABNORMAL HIGH (ref 150–400)
RBC: 3.5 MIL/uL — ABNORMAL LOW (ref 3.87–5.11)
RDW: 15.1 % (ref 11.5–15.5)
WBC: 12.7 10*3/uL — ABNORMAL HIGH (ref 4.0–10.5)
nRBC: 0 % (ref 0.0–0.2)

## 2022-11-29 LAB — GLUCOSE, CAPILLARY
Glucose-Capillary: 113 mg/dL — ABNORMAL HIGH (ref 70–99)
Glucose-Capillary: 115 mg/dL — ABNORMAL HIGH (ref 70–99)
Glucose-Capillary: 125 mg/dL — ABNORMAL HIGH (ref 70–99)
Glucose-Capillary: 127 mg/dL — ABNORMAL HIGH (ref 70–99)

## 2022-11-29 LAB — MAGNESIUM: Magnesium: 1.5 mg/dL — ABNORMAL LOW (ref 1.7–2.4)

## 2022-11-29 MED ORDER — APIXABAN 5 MG PO TABS: 5.0000 mg | ORAL_TABLET | Freq: Two times a day (BID) | ORAL | 2 refills | Status: AC

## 2022-11-29 MED ORDER — DILTIAZEM HCL ER COATED BEADS 120 MG PO CP24
120.0000 mg | ORAL_CAPSULE | Freq: Every day | ORAL | Status: DC
  Administered 2022-11-29: 120 mg via ORAL
  Filled 2022-11-29: qty 1

## 2022-11-29 MED ORDER — METOPROLOL TARTRATE 25 MG PO TABS: 25.0000 mg | ORAL_TABLET | Freq: Two times a day (BID) | ORAL | 1 refills | Status: DC

## 2022-11-29 MED ORDER — MAGNESIUM SULFATE 2 GM/50ML IV SOLN
2.0000 g | Freq: Once | INTRAVENOUS | Status: AC
  Administered 2022-11-29: 2 g via INTRAVENOUS
  Filled 2022-11-29: qty 50

## 2022-11-29 MED ORDER — CEFADROXIL 1 G PO TABS: 1.0000 g | ORAL_TABLET | Freq: Two times a day (BID) | ORAL | 0 refills | Status: AC

## 2022-11-29 MED ORDER — DILTIAZEM HCL ER COATED BEADS 120 MG PO CP24: 120.0000 mg | ORAL_CAPSULE | Freq: Every day | ORAL | 1 refills | Status: DC

## 2022-11-29 NOTE — Plan of Care (Signed)
  Problem: Education: Goal: Knowledge of disease or condition will improve Outcome: Completed/Met Goal: Knowledge of secondary prevention will improve (MUST DOCUMENT ALL) Outcome: Completed/Met Goal: Knowledge of patient specific risk factors will improve Loraine Leriche N/A or DELETE if not current risk factor) Outcome: Completed/Met   Problem: Ischemic Stroke/TIA Tissue Perfusion: Goal: Complications of ischemic stroke/TIA will be minimized Outcome: Completed/Met

## 2022-11-29 NOTE — Progress Notes (Signed)
SATURATION QUALIFICATIONS: (This note is used to comply with regulatory documentation for home oxygen)  Patient Saturations on Room Air at Rest = 95 %  Patient Saturations on Room Air while Ambulating = 97%  Patient Saturations on  Liters of oxygen while Ambulating =   Please briefly explain why patient needs home oxygen: Patient does not need oxygen at this time her o2 sat remained at 95-97% while ambulating on room air

## 2022-11-29 NOTE — Progress Notes (Signed)
Mobility Specialist Progress Note:    11/29/22 1200  Mobility  Activity Ambulated with assistance in room;Ambulated with assistance in hallway  Level of Assistance Standby assist, set-up cues, supervision of patient - no hands on  Assistive Device Front wheel walker  Distance Ambulated (ft) 15 ft  Range of Motion/Exercises Active;All extremities  Activity Response Tolerated well  Mobility Referral Yes  $Mobility charge 1 Mobility  Mobility Specialist Start Time (ACUTE ONLY) 1200  Mobility Specialist Stop Time (ACUTE ONLY) 1215  Mobility Specialist Time Calculation (min) (ACUTE ONLY) 15 min   Pt received sitting EOB, agreeable to mobility. Required SBA to stand and ambulate with RW. Tolerated well, SpO2 95% on RA throughout session. Returned pt sitting EOB, NT in room. All needs met.   Lawerance Bach Mobility Specialist Please contact via Special educational needs teacher or  Rehab office at [phone]

## 2022-11-29 NOTE — TOC Transition Note (Signed)
Transition of Care [facility]) - CM/SW Discharge Note   Patient Details  Name: [name] MRN: [id number] Date of Birth: 1951-01-01  Transition of Care [facility]) CM/SW Contact:  Jaevon Paras A , RN Phone Number: 11/29/2022, 11:11 AM   Clinical Narrative:    Patient discharging home with home health. Orders are in.  Maralyn Sago accepted.    Final next level of care: Home w Home Health Services Barriers to Discharge: No Barriers Identified   Patient Goals and CMS Choice   Choice offered to / list presented to : Patient  Discharge Placement                      Patient and family notified of of transfer: 11/29/22  Discharge Plan and Services Additional resources added to the After Visit Summary for       Post Acute Care Choice: Home Health          DME Arranged: N/A         HH Arranged: NA HH Agency: Other - See comment Date HH Agency Contacted: 11/29/22 Time HH Agency Contacted: 1110 Representative spoke with at [facility] Agency: Hotel manager  Social Determinants of Health (SDOH) Interventions SDOH Screenings   Food Insecurity: No Food Insecurity (11/25/2022)  Housing: Low Risk  (11/25/2022)  Transportation Needs: No Transportation Needs (11/25/2022)  Utilities: Not At Risk (11/25/2022)  Tobacco Use: Medium Risk (11/25/2022)     Readmission Risk Interventions     No data to display

## 2022-11-29 NOTE — Progress Notes (Signed)
Progress Note  Patient Name: [name] Date of Encounter: 11/29/2022  Primary Cardiologist: None  Subjective   No acute events overnight, converted to NSR last night.  No symptoms.  Diarrhea resolved.   Inpatient Medications    Scheduled Meds:  apixaban  5 mg Oral BID   Chlorhexidine Gluconate Cloth  6 each Topical Daily   diltiazem  30 mg Oral Q8H   insulin aspart  0-9 Units Subcutaneous Q6H   metoprolol tartrate  25 mg Oral BID   montelukast  10 mg Oral QHS   Continuous Infusions:  cefTRIAXone (ROCEPHIN)  IV 2 g (11/28/22 1431)   magnesium sulfate bolus IVPB     PRN Meds: acetaminophen **OR** acetaminophen, mouth rinse, promethazine   Vital Signs    Vitals:   11/28/22 1440 11/28/22 1904 11/28/22 2009 11/29/22 0512  BP: 129/76 114/76 131/76 (!) 149/79  Pulse: (!) 118 (!) 104 (!) 126 94  Resp:   (!) 30 20  Temp: 98.6 F (37 C) 98.6 F (37 C) 98.6 F (37 C) 98.8 F (37.1 C)  TempSrc: Oral Oral Oral Oral  SpO2: 97% 95% 94% 97%  Weight:      Height:        Intake/Output Summary (Last 24 hours) at 11/29/2022 0824 Last data filed at 11/28/2022 1957 Gross per 24 hour  Intake 53.19 ml  Output 1200 ml  Net -1146.81 ml   Filed Weights   11/25/22 1330 11/26/22 1153  Weight: 114.8 kg 112.3 kg    Telemetry     Personally reviewed.  ECG      Physical Exam   GEN: No acute distress.   Neck: No JVD. Cardiac: RRR, no murmur, rub, or gallop.  Respiratory: Nonlabored. Clear to auscultation bilaterally. GI: Soft, nontender, bowel sounds present. MS: No edema; No deformity. Neuro:  Nonfocal. Psych: Alert and oriented x 3. Normal affect.  Labs    Chemistry Recent Labs  Lab 11/27/22 0501 11/28/22 0618 11/29/22 0331  NA 138 135 136  K 3.8 3.8 3.5  CL 110 106 103  CO2 17* 19* 21*  GLUCOSE 121* 155* 119*  BUN 31* 19 14  CREATININE 1.51* 1.38* 1.21*  CALCIUM 8.5* 8.7* 8.4*  PROT 6.1* 6.5 6.2*  ALBUMIN 2.3* 2.4* 2.2*  AST 58* 47* 33  ALT  47* 47* 38  ALKPHOS 102 105 94  BILITOT 0.5 0.8 0.6  GFRNONAA 37* 41* 48*  ANIONGAP 11 10 12      Hematology Recent Labs  Lab 11/27/22 0501 11/28/22 0618 11/29/22 0331  WBC 14.3* 12.8* 12.7*  RBC 3.64* 3.83* 3.50*  HGB 9.7* 10.6* 9.7*  HCT 30.1* 32.0* 29.9*  MCV 82.7 83.6 85.4  MCH 26.6 27.7 27.7  MCHC 32.2 33.1 32.4  RDW 14.6 14.7 15.1  PLT 515* 609* 642*    Cardiac Enzymes Recent Labs  Lab 11/25/22 2337 11/26/22 0204  TROPONINIHS 10 10    BNPNo results for input(s): "BNP", "PROBNP" in the last 168 hours.   DDimerNo results for input(s): "DDIMER" in the last 168 hours.   Radiology    ECHOCARDIOGRAM COMPLETE  Result Date: 11/27/2022    ECHOCARDIOGRAM REPORT   Patient Name:   [name] [facility] Date of Exam: 11/27/2022 Medical Rec #:  [id number]        Height:       63.0 in Accession #:    [id number]       Weight:       247.6 lb Date of Birth:  12/01/1950         BSA:          2.117 m Patient Age:    72 years         BP:           132/65 mmHg Patient Gender: F                HR:           89 bpm. [facility] Procedure: 2D Echo, Cardiac Doppler, Color Doppler and Intracardiac            Opacification Agent Indications:    A fib I48.91  History:        Patient has prior history of Echocardiogram examinations, most                 recent 07/31/2022. Stroke; Risk Factors:Hypertension and Diabetes.  Sonographer:    Harriette Bouillon RDCS Referring Phys: [phone] Heloise Beecham EMOKPAE IMPRESSIONS  1. Left ventricular ejection fraction, by estimation, is 60 to 65%. The left ventricle has normal function. The left ventricle has no regional wall motion abnormalities. Left ventricular diastolic parameters were normal.  2. Right ventricular systolic function is normal. The right ventricular size is normal.  3. The mitral valve is normal in structure. No evidence of mitral valve regurgitation. No evidence of mitral stenosis.  4. The aortic valve is tricuspid. Aortic valve regurgitation is  not visualized. No aortic stenosis is present.  5. The inferior vena cava is normal in size with greater than 50% respiratory variability, suggesting right atrial pressure of 3 mmHg. FINDINGS  Left Ventricle: Left ventricular ejection fraction, by estimation, is 60 to 65%. The left ventricle has normal function. The left ventricle has no regional wall motion abnormalities. Definity contrast agent was given IV to delineate the left ventricular  endocardial borders. The left ventricular internal cavity size was normal in size. There is no left ventricular hypertrophy. Left ventricular diastolic parameters were normal. Right Ventricle: The right ventricular size is normal. No increase in right ventricular wall thickness. Right ventricular systolic function is normal. Left Atrium: Left atrial size was normal in size. Right Atrium: Right atrial size was normal in size. Pericardium: There is no evidence of pericardial effusion. Mitral Valve: The mitral valve is normal in structure. No evidence of mitral valve regurgitation. No evidence of mitral valve stenosis. Tricuspid Valve: The tricuspid valve is normal in structure. Tricuspid valve regurgitation is not demonstrated. No evidence of tricuspid stenosis. Aortic Valve: The aortic valve is tricuspid. Aortic valve regurgitation is not visualized. No aortic stenosis is present. Pulmonic Valve: The pulmonic valve was normal in structure. Pulmonic valve regurgitation is not visualized. No evidence of pulmonic stenosis. Aorta: The aortic root is normal in size and structure. Venous: The inferior vena cava is normal in size with greater than 50% respiratory variability, suggesting right atrial pressure of 3 mmHg. IAS/Shunts: No atrial level shunt detected by color flow Doppler.  LEFT VENTRICLE PLAX 2D LVIDd:         4.20 cm   Diastology LVIDs:         3.30 cm   LV e' medial:    6.42 cm/s LV PW:         1.10 cm   LV E/e' medial:  12.6 LV IVS:        1.10 cm   LV e' lateral:    7.29 cm/s LVOT diam:     2.00 cm  LV E/e' lateral: 11.1 LV SV:         77 LV SV Index:   37 LVOT Area:     3.14 cm  RIGHT VENTRICLE             IVC RV S prime:     14.90 cm/s  IVC diam: 2.00 cm TAPSE (M-mode): 2.0 cm LEFT ATRIUM             Index LA diam:        3.20 cm 1.51 cm/m LA Vol (A2C):   49.7 ml 23.47 ml/m LA Vol (A4C):   51.9 ml 24.51 ml/m LA Biplane Vol: 54.6 ml 25.79 ml/m  AORTIC VALVE LVOT Vmax:   127.00 cm/s LVOT Vmean:  85.300 cm/s LVOT VTI:    0.246 m  AORTA Ao Root diam: 3.00 cm Ao Asc diam:  3.20 cm MITRAL VALVE MV Area (PHT): 5.20 cm    SHUNTS MV Decel Time: 146 msec    Systemic VTI:  0.25 m MV E velocity: 81.00 cm/s  Systemic Diam: 2.00 cm MV A velocity: 87.00 cm/s MV E/A ratio:  0.93 Charlton Haws MD Electronically signed by Charlton Haws MD Signature Date/Time: 11/27/2022/1:10:56 PM    Final      Assessment & Plan   Paroxysmal A-fib, currently in NSR (CV score is 6) Sepsis secondary to UTI on antibiotics E. coli bacteremia on antibiotics Acute urinary retention secondary to mild bilateral hydroureteronephrosis   -Converted to NSR last night, will obtain EKG to confirm.  Continue metoprolol titrate 25 mg twice daily and switch short acting diltiazem to long-acting diltiazem 120 mg once daily.  Continue Eliquis 5 mg twice daily.  Echocardiogram on admission showed normal LVEF, normal diastology, normal RV function, no valvular heart disease.  Outpatient OSA evaluation.  Outpatient cardiology follow-up. -Diarrhea resolved, serum creatinine improving.  Currently on antibiotics, management per primary team.   [facility] will sign off.   Medication Recommendations: Metoprolol tartrate 25 mg twice daily, diltiazem 120 mg once daily, Eliquis 5 mg twice daily. Other recommendations (labs, testing, etc): None Follow up as an outpatient: Outpatient cardiology referral    Signed, Marjo Bicker, MD  11/29/2022, 8:24 AM

## 2022-11-29 NOTE — Care Management Important Message (Signed)
Important Message  Patient Details  Name: [name] MRN: [id number] Date of Birth: 26-Feb-1950   Important Message Given:  Yes - Medicare IM     Corey Harold 11/29/2022, 10:54 AM

## 2022-11-29 NOTE — Discharge Summary (Signed)
[facility]. Schedule an appointment as soon as possible for a visit in 1 week(s).   Contact information: [address] [place] [address] [phone]         [facility]. Go to.   Specialty: Cardiology Contact information: [address] [place] [address] [phone]               Allergies  Allergen Reactions   Ciprofloxacin Diarrhea and Nausea And Vomiting    stomach pain   Latex Itching   Sulfa Antibiotics Itching and Rash    Consultations: Cardiology   Procedures/Studies: ECHOCARDIOGRAM COMPLETE  Result Date: 11/27/2022    ECHOCARDIOGRAM REPORT   Patient Name:   [name] Date of Exam: 11/27/2022 Medical Rec #:  [id number]        Height:       63.0 in Accession #:    [id number]       Weight:       247.6 lb Date of Birth:  [birth year]         BSA:          2.117 m Patient Age:    72 years         BP:           132/65 mmHg Patient Gender: F                HR:           89 bpm. [facility] Procedure: 2D Echo, Cardiac  Doppler, Color Doppler and Intracardiac            Opacification Agent Indications:    A fib I48.91  History:        Patient has prior history of Echocardiogram examinations, most                 recent 07/31/2022. Stroke; Risk Factors:Hypertension and Diabetes.  Sonographer:    Harriette Bouillon RDCS Referring Phys: [phone] Heloise Beecham EMOKPAE IMPRESSIONS  1. Left ventricular ejection fraction, by estimation, is 60 to 65%. The left ventricle has normal function. The left ventricle has no regional wall motion abnormalities. Left ventricular diastolic parameters were normal.  2. Right ventricular systolic function is normal. The right ventricular size is normal.  3. The mitral valve is normal in structure. No evidence of mitral valve regurgitation. No evidence of mitral stenosis.  4. The aortic valve is tricuspid. Aortic valve regurgitation is not visualized. No aortic stenosis is present.  5. The inferior vena cava is normal in size with greater than 50% respiratory variability, suggesting right atrial pressure of 3 mmHg. FINDINGS  Left Ventricle: Left ventricular ejection fraction, by estimation, is 60 to 65%. The left ventricle has normal function. The left ventricle has no regional wall motion abnormalities. Definity contrast agent was given IV to delineate the left ventricular  endocardial borders. The left ventricular internal cavity size was normal in size. There is no left ventricular hypertrophy. Left ventricular diastolic parameters were normal. Right Ventricle: The right ventricular size is normal. No increase in right ventricular wall thickness. Right ventricular systolic function is normal. Left Atrium: Left atrial size was normal in size. Right Atrium: Right atrial size was normal in size. Pericardium: There is no evidence of pericardial effusion. Mitral Valve: The mitral valve is normal in structure. No evidence of mitral valve regurgitation. No evidence of mitral valve stenosis. Tricuspid Valve: The tricuspid  valve is normal in structure. Tricuspid valve regurgitation is  Physician Discharge Summary  [name] ZOX:[id number] DOB: 02-Oct-1950 DOA: 11/25/2022  PCP: [facility]  Admit date: 11/25/2022  Discharge date: 11/29/2022  Admitted From:Home  Disposition:  Home  Recommendations for Outpatient Follow-up:  Follow up with PCP in 1-2 weeks Follow-up with cardiology outpatient regarding atrial fibrillation Continue on metoprolol and diltiazem as prescribed below along with Eliquis for anticoagulation Finish course of cefadroxil 1 g twice daily for the next 5 days to complete 10-day course of treatment for E. coli UTI with bacteremia Continue other home medications as prior  Home Health: Home health PT  Equipment/Devices: None  Discharge Condition:Stable  CODE STATUS: Full  Diet recommendation: Heart Healthy/carb modified  Brief/Interim Summary:  [name] is a 72 y.o. female with medical history significant for hypertension, diabetes mellitus, bipolar disorder, stroke, morbid obesity. Patient presented to the ED with complaints of multiple episodes/continuous diarrhea that started yesterday till this morning with associated abdominal pain.  She reports about 3 days of vomiting that has now resolved but this occurred about 2 days prior to onset of diarrhea.   She reports palpitations also, with generalized weakness.  Patient was admitted with atrial fibrillation with RVR in the setting of severe sepsis with UTI and diarrhea.  She is also noted to have AKI along with acute urinary retention.  Her blood and urine cultures demonstrated growth of E. coli that was susceptible to Rocephin that she was on.  She had some difficulty with heart rate control over the course of the weekend and is now on metoprolol and diltiazem along with Eliquis per cardiology.  She will need close outpatient follow-up and remain on cefadroxil as prescribed to complete the course of treatment for her UTI.  No other acute events or concerns noted.  Discharge  Diagnoses:  Principal Problem:   Atrial fibrillation with RVR (HCC) Active Problems:   Severe sepsis (HCC)   UTI (urinary tract infection)   Acute urinary retention   AKI (acute kidney injury) (HCC)   Prolonged QT interval   Diarrhea   Morbid obesity (HCC)   Essential hypertension   Bipolar 1 disorder (HCC)   Diabetes mellitus without complication (HCC)   Stroke (HCC)  Principal discharge diagnosis: Severe sepsis secondary to E. coli UTI with bacteremia with associated AKI and acute urinary retention-resolved.  Atrial fibrillation with RVR secondary to sepsis-resolved.  Discharge Instructions  Discharge Instructions     Ambulatory referral to Cardiology   Complete by: As directed    Diet - low sodium heart healthy   Complete by: As directed    Increase activity slowly   Complete by: As directed       Allergies as of 11/29/2022       Reactions   Ciprofloxacin Diarrhea, Nausea And Vomiting   stomach pain   Latex Itching   Sulfa Antibiotics Itching, Rash        Medication List     STOP taking these medications    clopidogrel 75 MG tablet Commonly known as: Plavix   hydrochlorothiazide 25 MG tablet Commonly known as: HYDRODIURIL   irbesartan 300 MG tablet Commonly known as: AVAPRO       TAKE these medications    acetaminophen 650 MG CR tablet Commonly known as: TYLENOL Take 1,300 mg by mouth every 8 (eight) hours as needed for pain. BID   albuterol 108 (90 Base) MCG/ACT inhaler Commonly known as: VENTOLIN HFA Inhale 2 puffs into the lungs every 8 (eight) hours as needed  [facility]. Schedule an appointment as soon as possible for a visit in 1 week(s).   Contact information: [address] [place] [address] [phone]         [facility]. Go to.   Specialty: Cardiology Contact information: [address] [place] [address] [phone]               Allergies  Allergen Reactions   Ciprofloxacin Diarrhea and Nausea And Vomiting    stomach pain   Latex Itching   Sulfa Antibiotics Itching and Rash    Consultations: Cardiology   Procedures/Studies: ECHOCARDIOGRAM COMPLETE  Result Date: 11/27/2022    ECHOCARDIOGRAM REPORT   Patient Name:   [name] Date of Exam: 11/27/2022 Medical Rec #:  [id number]        Height:       63.0 in Accession #:    [id number]       Weight:       247.6 lb Date of Birth:  [birth year]         BSA:          2.117 m Patient Age:    72 years         BP:           132/65 mmHg Patient Gender: F                HR:           89 bpm. [facility] Procedure: 2D Echo, Cardiac  Doppler, Color Doppler and Intracardiac            Opacification Agent Indications:    A fib I48.91  History:        Patient has prior history of Echocardiogram examinations, most                 recent 07/31/2022. Stroke; Risk Factors:Hypertension and Diabetes.  Sonographer:    Harriette Bouillon RDCS Referring Phys: [phone] Heloise Beecham EMOKPAE IMPRESSIONS  1. Left ventricular ejection fraction, by estimation, is 60 to 65%. The left ventricle has normal function. The left ventricle has no regional wall motion abnormalities. Left ventricular diastolic parameters were normal.  2. Right ventricular systolic function is normal. The right ventricular size is normal.  3. The mitral valve is normal in structure. No evidence of mitral valve regurgitation. No evidence of mitral stenosis.  4. The aortic valve is tricuspid. Aortic valve regurgitation is not visualized. No aortic stenosis is present.  5. The inferior vena cava is normal in size with greater than 50% respiratory variability, suggesting right atrial pressure of 3 mmHg. FINDINGS  Left Ventricle: Left ventricular ejection fraction, by estimation, is 60 to 65%. The left ventricle has normal function. The left ventricle has no regional wall motion abnormalities. Definity contrast agent was given IV to delineate the left ventricular  endocardial borders. The left ventricular internal cavity size was normal in size. There is no left ventricular hypertrophy. Left ventricular diastolic parameters were normal. Right Ventricle: The right ventricular size is normal. No increase in right ventricular wall thickness. Right ventricular systolic function is normal. Left Atrium: Left atrial size was normal in size. Right Atrium: Right atrial size was normal in size. Pericardium: There is no evidence of pericardial effusion. Mitral Valve: The mitral valve is normal in structure. No evidence of mitral valve regurgitation. No evidence of mitral valve stenosis. Tricuspid Valve: The tricuspid  valve is normal in structure. Tricuspid valve regurgitation is  [facility]. Schedule an appointment as soon as possible for a visit in 1 week(s).   Contact information: [address] [place] [address] [phone]         [facility]. Go to.   Specialty: Cardiology Contact information: [address] [place] [address] [phone]               Allergies  Allergen Reactions   Ciprofloxacin Diarrhea and Nausea And Vomiting    stomach pain   Latex Itching   Sulfa Antibiotics Itching and Rash    Consultations: Cardiology   Procedures/Studies: ECHOCARDIOGRAM COMPLETE  Result Date: 11/27/2022    ECHOCARDIOGRAM REPORT   Patient Name:   [name] Date of Exam: 11/27/2022 Medical Rec #:  [id number]        Height:       63.0 in Accession #:    [id number]       Weight:       247.6 lb Date of Birth:  [birth year]         BSA:          2.117 m Patient Age:    72 years         BP:           132/65 mmHg Patient Gender: F                HR:           89 bpm. [facility] Procedure: 2D Echo, Cardiac  Doppler, Color Doppler and Intracardiac            Opacification Agent Indications:    A fib I48.91  History:        Patient has prior history of Echocardiogram examinations, most                 recent 07/31/2022. Stroke; Risk Factors:Hypertension and Diabetes.  Sonographer:    Harriette Bouillon RDCS Referring Phys: [phone] Heloise Beecham EMOKPAE IMPRESSIONS  1. Left ventricular ejection fraction, by estimation, is 60 to 65%. The left ventricle has normal function. The left ventricle has no regional wall motion abnormalities. Left ventricular diastolic parameters were normal.  2. Right ventricular systolic function is normal. The right ventricular size is normal.  3. The mitral valve is normal in structure. No evidence of mitral valve regurgitation. No evidence of mitral stenosis.  4. The aortic valve is tricuspid. Aortic valve regurgitation is not visualized. No aortic stenosis is present.  5. The inferior vena cava is normal in size with greater than 50% respiratory variability, suggesting right atrial pressure of 3 mmHg. FINDINGS  Left Ventricle: Left ventricular ejection fraction, by estimation, is 60 to 65%. The left ventricle has normal function. The left ventricle has no regional wall motion abnormalities. Definity contrast agent was given IV to delineate the left ventricular  endocardial borders. The left ventricular internal cavity size was normal in size. There is no left ventricular hypertrophy. Left ventricular diastolic parameters were normal. Right Ventricle: The right ventricular size is normal. No increase in right ventricular wall thickness. Right ventricular systolic function is normal. Left Atrium: Left atrial size was normal in size. Right Atrium: Right atrial size was normal in size. Pericardium: There is no evidence of pericardial effusion. Mitral Valve: The mitral valve is normal in structure. No evidence of mitral valve regurgitation. No evidence of mitral valve stenosis. Tricuspid Valve: The tricuspid  valve is normal in structure. Tricuspid valve regurgitation is  [facility]. Schedule an appointment as soon as possible for a visit in 1 week(s).   Contact information: [address] [place] [address] [phone]         [facility]. Go to.   Specialty: Cardiology Contact information: [address] [place] [address] [phone]               Allergies  Allergen Reactions   Ciprofloxacin Diarrhea and Nausea And Vomiting    stomach pain   Latex Itching   Sulfa Antibiotics Itching and Rash    Consultations: Cardiology   Procedures/Studies: ECHOCARDIOGRAM COMPLETE  Result Date: 11/27/2022    ECHOCARDIOGRAM REPORT   Patient Name:   [name] Date of Exam: 11/27/2022 Medical Rec #:  [id number]        Height:       63.0 in Accession #:    [id number]       Weight:       247.6 lb Date of Birth:  [birth year]         BSA:          2.117 m Patient Age:    72 years         BP:           132/65 mmHg Patient Gender: F                HR:           89 bpm. [facility] Procedure: 2D Echo, Cardiac  Doppler, Color Doppler and Intracardiac            Opacification Agent Indications:    A fib I48.91  History:        Patient has prior history of Echocardiogram examinations, most                 recent 07/31/2022. Stroke; Risk Factors:Hypertension and Diabetes.  Sonographer:    Harriette Bouillon RDCS Referring Phys: [phone] Heloise Beecham EMOKPAE IMPRESSIONS  1. Left ventricular ejection fraction, by estimation, is 60 to 65%. The left ventricle has normal function. The left ventricle has no regional wall motion abnormalities. Left ventricular diastolic parameters were normal.  2. Right ventricular systolic function is normal. The right ventricular size is normal.  3. The mitral valve is normal in structure. No evidence of mitral valve regurgitation. No evidence of mitral stenosis.  4. The aortic valve is tricuspid. Aortic valve regurgitation is not visualized. No aortic stenosis is present.  5. The inferior vena cava is normal in size with greater than 50% respiratory variability, suggesting right atrial pressure of 3 mmHg. FINDINGS  Left Ventricle: Left ventricular ejection fraction, by estimation, is 60 to 65%. The left ventricle has normal function. The left ventricle has no regional wall motion abnormalities. Definity contrast agent was given IV to delineate the left ventricular  endocardial borders. The left ventricular internal cavity size was normal in size. There is no left ventricular hypertrophy. Left ventricular diastolic parameters were normal. Right Ventricle: The right ventricular size is normal. No increase in right ventricular wall thickness. Right ventricular systolic function is normal. Left Atrium: Left atrial size was normal in size. Right Atrium: Right atrial size was normal in size. Pericardium: There is no evidence of pericardial effusion. Mitral Valve: The mitral valve is normal in structure. No evidence of mitral valve regurgitation. No evidence of mitral valve stenosis. Tricuspid Valve: The tricuspid  valve is normal in structure. Tricuspid valve regurgitation is  [facility]. Schedule an appointment as soon as possible for a visit in 1 week(s).   Contact information: [address] [place] [address] [phone]         [facility]. Go to.   Specialty: Cardiology Contact information: [address] [place] [address] [phone]               Allergies  Allergen Reactions   Ciprofloxacin Diarrhea and Nausea And Vomiting    stomach pain   Latex Itching   Sulfa Antibiotics Itching and Rash    Consultations: Cardiology   Procedures/Studies: ECHOCARDIOGRAM COMPLETE  Result Date: 11/27/2022    ECHOCARDIOGRAM REPORT   Patient Name:   [name] Date of Exam: 11/27/2022 Medical Rec #:  [id number]        Height:       63.0 in Accession #:    [id number]       Weight:       247.6 lb Date of Birth:  [birth year]         BSA:          2.117 m Patient Age:    72 years         BP:           132/65 mmHg Patient Gender: F                HR:           89 bpm. [facility] Procedure: 2D Echo, Cardiac  Doppler, Color Doppler and Intracardiac            Opacification Agent Indications:    A fib I48.91  History:        Patient has prior history of Echocardiogram examinations, most                 recent 07/31/2022. Stroke; Risk Factors:Hypertension and Diabetes.  Sonographer:    Harriette Bouillon RDCS Referring Phys: [phone] Heloise Beecham EMOKPAE IMPRESSIONS  1. Left ventricular ejection fraction, by estimation, is 60 to 65%. The left ventricle has normal function. The left ventricle has no regional wall motion abnormalities. Left ventricular diastolic parameters were normal.  2. Right ventricular systolic function is normal. The right ventricular size is normal.  3. The mitral valve is normal in structure. No evidence of mitral valve regurgitation. No evidence of mitral stenosis.  4. The aortic valve is tricuspid. Aortic valve regurgitation is not visualized. No aortic stenosis is present.  5. The inferior vena cava is normal in size with greater than 50% respiratory variability, suggesting right atrial pressure of 3 mmHg. FINDINGS  Left Ventricle: Left ventricular ejection fraction, by estimation, is 60 to 65%. The left ventricle has normal function. The left ventricle has no regional wall motion abnormalities. Definity contrast agent was given IV to delineate the left ventricular  endocardial borders. The left ventricular internal cavity size was normal in size. There is no left ventricular hypertrophy. Left ventricular diastolic parameters were normal. Right Ventricle: The right ventricular size is normal. No increase in right ventricular wall thickness. Right ventricular systolic function is normal. Left Atrium: Left atrial size was normal in size. Right Atrium: Right atrial size was normal in size. Pericardium: There is no evidence of pericardial effusion. Mitral Valve: The mitral valve is normal in structure. No evidence of mitral valve regurgitation. No evidence of mitral valve stenosis. Tricuspid Valve: The tricuspid  valve is normal in structure. Tricuspid valve regurgitation is  Physician Discharge Summary  [name] ZOX:[id number] DOB: 02-Oct-1950 DOA: 11/25/2022  PCP: [facility]  Admit date: 11/25/2022  Discharge date: 11/29/2022  Admitted From:Home  Disposition:  Home  Recommendations for Outpatient Follow-up:  Follow up with PCP in 1-2 weeks Follow-up with cardiology outpatient regarding atrial fibrillation Continue on metoprolol and diltiazem as prescribed below along with Eliquis for anticoagulation Finish course of cefadroxil 1 g twice daily for the next 5 days to complete 10-day course of treatment for E. coli UTI with bacteremia Continue other home medications as prior  Home Health: Home health PT  Equipment/Devices: None  Discharge Condition:Stable  CODE STATUS: Full  Diet recommendation: Heart Healthy/carb modified  Brief/Interim Summary:  [name] is a 72 y.o. female with medical history significant for hypertension, diabetes mellitus, bipolar disorder, stroke, morbid obesity. Patient presented to the ED with complaints of multiple episodes/continuous diarrhea that started yesterday till this morning with associated abdominal pain.  She reports about 3 days of vomiting that has now resolved but this occurred about 2 days prior to onset of diarrhea.   She reports palpitations also, with generalized weakness.  Patient was admitted with atrial fibrillation with RVR in the setting of severe sepsis with UTI and diarrhea.  She is also noted to have AKI along with acute urinary retention.  Her blood and urine cultures demonstrated growth of E. coli that was susceptible to Rocephin that she was on.  She had some difficulty with heart rate control over the course of the weekend and is now on metoprolol and diltiazem along with Eliquis per cardiology.  She will need close outpatient follow-up and remain on cefadroxil as prescribed to complete the course of treatment for her UTI.  No other acute events or concerns noted.  Discharge  Diagnoses:  Principal Problem:   Atrial fibrillation with RVR (HCC) Active Problems:   Severe sepsis (HCC)   UTI (urinary tract infection)   Acute urinary retention   AKI (acute kidney injury) (HCC)   Prolonged QT interval   Diarrhea   Morbid obesity (HCC)   Essential hypertension   Bipolar 1 disorder (HCC)   Diabetes mellitus without complication (HCC)   Stroke (HCC)  Principal discharge diagnosis: Severe sepsis secondary to E. coli UTI with bacteremia with associated AKI and acute urinary retention-resolved.  Atrial fibrillation with RVR secondary to sepsis-resolved.  Discharge Instructions  Discharge Instructions     Ambulatory referral to Cardiology   Complete by: As directed    Diet - low sodium heart healthy   Complete by: As directed    Increase activity slowly   Complete by: As directed       Allergies as of 11/29/2022       Reactions   Ciprofloxacin Diarrhea, Nausea And Vomiting   stomach pain   Latex Itching   Sulfa Antibiotics Itching, Rash        Medication List     STOP taking these medications    clopidogrel 75 MG tablet Commonly known as: Plavix   hydrochlorothiazide 25 MG tablet Commonly known as: HYDRODIURIL   irbesartan 300 MG tablet Commonly known as: AVAPRO       TAKE these medications    acetaminophen 650 MG CR tablet Commonly known as: TYLENOL Take 1,300 mg by mouth every 8 (eight) hours as needed for pain. BID   albuterol 108 (90 Base) MCG/ACT inhaler Commonly known as: VENTOLIN HFA Inhale 2 puffs into the lungs every 8 (eight) hours as needed  Physician Discharge Summary  [name] ZOX:[id number] DOB: 02-Oct-1950 DOA: 11/25/2022  PCP: [facility]  Admit date: 11/25/2022  Discharge date: 11/29/2022  Admitted From:Home  Disposition:  Home  Recommendations for Outpatient Follow-up:  Follow up with PCP in 1-2 weeks Follow-up with cardiology outpatient regarding atrial fibrillation Continue on metoprolol and diltiazem as prescribed below along with Eliquis for anticoagulation Finish course of cefadroxil 1 g twice daily for the next 5 days to complete 10-day course of treatment for E. coli UTI with bacteremia Continue other home medications as prior  Home Health: Home health PT  Equipment/Devices: None  Discharge Condition:Stable  CODE STATUS: Full  Diet recommendation: Heart Healthy/carb modified  Brief/Interim Summary:  [name] is a 72 y.o. female with medical history significant for hypertension, diabetes mellitus, bipolar disorder, stroke, morbid obesity. Patient presented to the ED with complaints of multiple episodes/continuous diarrhea that started yesterday till this morning with associated abdominal pain.  She reports about 3 days of vomiting that has now resolved but this occurred about 2 days prior to onset of diarrhea.   She reports palpitations also, with generalized weakness.  Patient was admitted with atrial fibrillation with RVR in the setting of severe sepsis with UTI and diarrhea.  She is also noted to have AKI along with acute urinary retention.  Her blood and urine cultures demonstrated growth of E. coli that was susceptible to Rocephin that she was on.  She had some difficulty with heart rate control over the course of the weekend and is now on metoprolol and diltiazem along with Eliquis per cardiology.  She will need close outpatient follow-up and remain on cefadroxil as prescribed to complete the course of treatment for her UTI.  No other acute events or concerns noted.  Discharge  Diagnoses:  Principal Problem:   Atrial fibrillation with RVR (HCC) Active Problems:   Severe sepsis (HCC)   UTI (urinary tract infection)   Acute urinary retention   AKI (acute kidney injury) (HCC)   Prolonged QT interval   Diarrhea   Morbid obesity (HCC)   Essential hypertension   Bipolar 1 disorder (HCC)   Diabetes mellitus without complication (HCC)   Stroke (HCC)  Principal discharge diagnosis: Severe sepsis secondary to E. coli UTI with bacteremia with associated AKI and acute urinary retention-resolved.  Atrial fibrillation with RVR secondary to sepsis-resolved.  Discharge Instructions  Discharge Instructions     Ambulatory referral to Cardiology   Complete by: As directed    Diet - low sodium heart healthy   Complete by: As directed    Increase activity slowly   Complete by: As directed       Allergies as of 11/29/2022       Reactions   Ciprofloxacin Diarrhea, Nausea And Vomiting   stomach pain   Latex Itching   Sulfa Antibiotics Itching, Rash        Medication List     STOP taking these medications    clopidogrel 75 MG tablet Commonly known as: Plavix   hydrochlorothiazide 25 MG tablet Commonly known as: HYDRODIURIL   irbesartan 300 MG tablet Commonly known as: AVAPRO       TAKE these medications    acetaminophen 650 MG CR tablet Commonly known as: TYLENOL Take 1,300 mg by mouth every 8 (eight) hours as needed for pain. BID   albuterol 108 (90 Base) MCG/ACT inhaler Commonly known as: VENTOLIN HFA Inhale 2 puffs into the lungs every 8 (eight) hours as needed  [facility]. Schedule an appointment as soon as possible for a visit in 1 week(s).   Contact information: [address] [place] [address] [phone]         [facility]. Go to.   Specialty: Cardiology Contact information: [address] [place] [address] [phone]               Allergies  Allergen Reactions   Ciprofloxacin Diarrhea and Nausea And Vomiting    stomach pain   Latex Itching   Sulfa Antibiotics Itching and Rash    Consultations: Cardiology   Procedures/Studies: ECHOCARDIOGRAM COMPLETE  Result Date: 11/27/2022    ECHOCARDIOGRAM REPORT   Patient Name:   [name] Date of Exam: 11/27/2022 Medical Rec #:  [id number]        Height:       63.0 in Accession #:    [id number]       Weight:       247.6 lb Date of Birth:  [birth year]         BSA:          2.117 m Patient Age:    72 years         BP:           132/65 mmHg Patient Gender: F                HR:           89 bpm. [facility] Procedure: 2D Echo, Cardiac  Doppler, Color Doppler and Intracardiac            Opacification Agent Indications:    A fib I48.91  History:        Patient has prior history of Echocardiogram examinations, most                 recent 07/31/2022. Stroke; Risk Factors:Hypertension and Diabetes.  Sonographer:    Harriette Bouillon RDCS Referring Phys: [phone] Heloise Beecham EMOKPAE IMPRESSIONS  1. Left ventricular ejection fraction, by estimation, is 60 to 65%. The left ventricle has normal function. The left ventricle has no regional wall motion abnormalities. Left ventricular diastolic parameters were normal.  2. Right ventricular systolic function is normal. The right ventricular size is normal.  3. The mitral valve is normal in structure. No evidence of mitral valve regurgitation. No evidence of mitral stenosis.  4. The aortic valve is tricuspid. Aortic valve regurgitation is not visualized. No aortic stenosis is present.  5. The inferior vena cava is normal in size with greater than 50% respiratory variability, suggesting right atrial pressure of 3 mmHg. FINDINGS  Left Ventricle: Left ventricular ejection fraction, by estimation, is 60 to 65%. The left ventricle has normal function. The left ventricle has no regional wall motion abnormalities. Definity contrast agent was given IV to delineate the left ventricular  endocardial borders. The left ventricular internal cavity size was normal in size. There is no left ventricular hypertrophy. Left ventricular diastolic parameters were normal. Right Ventricle: The right ventricular size is normal. No increase in right ventricular wall thickness. Right ventricular systolic function is normal. Left Atrium: Left atrial size was normal in size. Right Atrium: Right atrial size was normal in size. Pericardium: There is no evidence of pericardial effusion. Mitral Valve: The mitral valve is normal in structure. No evidence of mitral valve regurgitation. No evidence of mitral valve stenosis. Tricuspid Valve: The tricuspid  valve is normal in structure. Tricuspid valve regurgitation is  Physician Discharge Summary  [name] ZOX:[id number] DOB: 02-Oct-1950 DOA: 11/25/2022  PCP: [facility]  Admit date: 11/25/2022  Discharge date: 11/29/2022  Admitted From:Home  Disposition:  Home  Recommendations for Outpatient Follow-up:  Follow up with PCP in 1-2 weeks Follow-up with cardiology outpatient regarding atrial fibrillation Continue on metoprolol and diltiazem as prescribed below along with Eliquis for anticoagulation Finish course of cefadroxil 1 g twice daily for the next 5 days to complete 10-day course of treatment for E. coli UTI with bacteremia Continue other home medications as prior  Home Health: Home health PT  Equipment/Devices: None  Discharge Condition:Stable  CODE STATUS: Full  Diet recommendation: Heart Healthy/carb modified  Brief/Interim Summary:  [name] is a 72 y.o. female with medical history significant for hypertension, diabetes mellitus, bipolar disorder, stroke, morbid obesity. Patient presented to the ED with complaints of multiple episodes/continuous diarrhea that started yesterday till this morning with associated abdominal pain.  She reports about 3 days of vomiting that has now resolved but this occurred about 2 days prior to onset of diarrhea.   She reports palpitations also, with generalized weakness.  Patient was admitted with atrial fibrillation with RVR in the setting of severe sepsis with UTI and diarrhea.  She is also noted to have AKI along with acute urinary retention.  Her blood and urine cultures demonstrated growth of E. coli that was susceptible to Rocephin that she was on.  She had some difficulty with heart rate control over the course of the weekend and is now on metoprolol and diltiazem along with Eliquis per cardiology.  She will need close outpatient follow-up and remain on cefadroxil as prescribed to complete the course of treatment for her UTI.  No other acute events or concerns noted.  Discharge  Diagnoses:  Principal Problem:   Atrial fibrillation with RVR (HCC) Active Problems:   Severe sepsis (HCC)   UTI (urinary tract infection)   Acute urinary retention   AKI (acute kidney injury) (HCC)   Prolonged QT interval   Diarrhea   Morbid obesity (HCC)   Essential hypertension   Bipolar 1 disorder (HCC)   Diabetes mellitus without complication (HCC)   Stroke (HCC)  Principal discharge diagnosis: Severe sepsis secondary to E. coli UTI with bacteremia with associated AKI and acute urinary retention-resolved.  Atrial fibrillation with RVR secondary to sepsis-resolved.  Discharge Instructions  Discharge Instructions     Ambulatory referral to Cardiology   Complete by: As directed    Diet - low sodium heart healthy   Complete by: As directed    Increase activity slowly   Complete by: As directed       Allergies as of 11/29/2022       Reactions   Ciprofloxacin Diarrhea, Nausea And Vomiting   stomach pain   Latex Itching   Sulfa Antibiotics Itching, Rash        Medication List     STOP taking these medications    clopidogrel 75 MG tablet Commonly known as: Plavix   hydrochlorothiazide 25 MG tablet Commonly known as: HYDRODIURIL   irbesartan 300 MG tablet Commonly known as: AVAPRO       TAKE these medications    acetaminophen 650 MG CR tablet Commonly known as: TYLENOL Take 1,300 mg by mouth every 8 (eight) hours as needed for pain. BID   albuterol 108 (90 Base) MCG/ACT inhaler Commonly known as: VENTOLIN HFA Inhale 2 puffs into the lungs every 8 (eight) hours as needed  [facility]. Schedule an appointment as soon as possible for a visit in 1 week(s).   Contact information: [address] [place] [address] [phone]         [facility]. Go to.   Specialty: Cardiology Contact information: [address] [place] [address] [phone]               Allergies  Allergen Reactions   Ciprofloxacin Diarrhea and Nausea And Vomiting    stomach pain   Latex Itching   Sulfa Antibiotics Itching and Rash    Consultations: Cardiology   Procedures/Studies: ECHOCARDIOGRAM COMPLETE  Result Date: 11/27/2022    ECHOCARDIOGRAM REPORT   Patient Name:   [name] Date of Exam: 11/27/2022 Medical Rec #:  [id number]        Height:       63.0 in Accession #:    [id number]       Weight:       247.6 lb Date of Birth:  [birth year]         BSA:          2.117 m Patient Age:    72 years         BP:           132/65 mmHg Patient Gender: F                HR:           89 bpm. [facility] Procedure: 2D Echo, Cardiac  Doppler, Color Doppler and Intracardiac            Opacification Agent Indications:    A fib I48.91  History:        Patient has prior history of Echocardiogram examinations, most                 recent 07/31/2022. Stroke; Risk Factors:Hypertension and Diabetes.  Sonographer:    Harriette Bouillon RDCS Referring Phys: [phone] Heloise Beecham EMOKPAE IMPRESSIONS  1. Left ventricular ejection fraction, by estimation, is 60 to 65%. The left ventricle has normal function. The left ventricle has no regional wall motion abnormalities. Left ventricular diastolic parameters were normal.  2. Right ventricular systolic function is normal. The right ventricular size is normal.  3. The mitral valve is normal in structure. No evidence of mitral valve regurgitation. No evidence of mitral stenosis.  4. The aortic valve is tricuspid. Aortic valve regurgitation is not visualized. No aortic stenosis is present.  5. The inferior vena cava is normal in size with greater than 50% respiratory variability, suggesting right atrial pressure of 3 mmHg. FINDINGS  Left Ventricle: Left ventricular ejection fraction, by estimation, is 60 to 65%. The left ventricle has normal function. The left ventricle has no regional wall motion abnormalities. Definity contrast agent was given IV to delineate the left ventricular  endocardial borders. The left ventricular internal cavity size was normal in size. There is no left ventricular hypertrophy. Left ventricular diastolic parameters were normal. Right Ventricle: The right ventricular size is normal. No increase in right ventricular wall thickness. Right ventricular systolic function is normal. Left Atrium: Left atrial size was normal in size. Right Atrium: Right atrial size was normal in size. Pericardium: There is no evidence of pericardial effusion. Mitral Valve: The mitral valve is normal in structure. No evidence of mitral valve regurgitation. No evidence of mitral valve stenosis. Tricuspid Valve: The tricuspid  valve is normal in structure. Tricuspid valve regurgitation is  Physician Discharge Summary  [name] ZOX:[id number] DOB: 02-Oct-1950 DOA: 11/25/2022  PCP: [facility]  Admit date: 11/25/2022  Discharge date: 11/29/2022  Admitted From:Home  Disposition:  Home  Recommendations for Outpatient Follow-up:  Follow up with PCP in 1-2 weeks Follow-up with cardiology outpatient regarding atrial fibrillation Continue on metoprolol and diltiazem as prescribed below along with Eliquis for anticoagulation Finish course of cefadroxil 1 g twice daily for the next 5 days to complete 10-day course of treatment for E. coli UTI with bacteremia Continue other home medications as prior  Home Health: Home health PT  Equipment/Devices: None  Discharge Condition:Stable  CODE STATUS: Full  Diet recommendation: Heart Healthy/carb modified  Brief/Interim Summary:  [name] is a 72 y.o. female with medical history significant for hypertension, diabetes mellitus, bipolar disorder, stroke, morbid obesity. Patient presented to the ED with complaints of multiple episodes/continuous diarrhea that started yesterday till this morning with associated abdominal pain.  She reports about 3 days of vomiting that has now resolved but this occurred about 2 days prior to onset of diarrhea.   She reports palpitations also, with generalized weakness.  Patient was admitted with atrial fibrillation with RVR in the setting of severe sepsis with UTI and diarrhea.  She is also noted to have AKI along with acute urinary retention.  Her blood and urine cultures demonstrated growth of E. coli that was susceptible to Rocephin that she was on.  She had some difficulty with heart rate control over the course of the weekend and is now on metoprolol and diltiazem along with Eliquis per cardiology.  She will need close outpatient follow-up and remain on cefadroxil as prescribed to complete the course of treatment for her UTI.  No other acute events or concerns noted.  Discharge  Diagnoses:  Principal Problem:   Atrial fibrillation with RVR (HCC) Active Problems:   Severe sepsis (HCC)   UTI (urinary tract infection)   Acute urinary retention   AKI (acute kidney injury) (HCC)   Prolonged QT interval   Diarrhea   Morbid obesity (HCC)   Essential hypertension   Bipolar 1 disorder (HCC)   Diabetes mellitus without complication (HCC)   Stroke (HCC)  Principal discharge diagnosis: Severe sepsis secondary to E. coli UTI with bacteremia with associated AKI and acute urinary retention-resolved.  Atrial fibrillation with RVR secondary to sepsis-resolved.  Discharge Instructions  Discharge Instructions     Ambulatory referral to Cardiology   Complete by: As directed    Diet - low sodium heart healthy   Complete by: As directed    Increase activity slowly   Complete by: As directed       Allergies as of 11/29/2022       Reactions   Ciprofloxacin Diarrhea, Nausea And Vomiting   stomach pain   Latex Itching   Sulfa Antibiotics Itching, Rash        Medication List     STOP taking these medications    clopidogrel 75 MG tablet Commonly known as: Plavix   hydrochlorothiazide 25 MG tablet Commonly known as: HYDRODIURIL   irbesartan 300 MG tablet Commonly known as: AVAPRO       TAKE these medications    acetaminophen 650 MG CR tablet Commonly known as: TYLENOL Take 1,300 mg by mouth every 8 (eight) hours as needed for pain. BID   albuterol 108 (90 Base) MCG/ACT inhaler Commonly known as: VENTOLIN HFA Inhale 2 puffs into the lungs every 8 (eight) hours as needed  [facility]. Schedule an appointment as soon as possible for a visit in 1 week(s).   Contact information: [address] [place] [address] [phone]         [facility]. Go to.   Specialty: Cardiology Contact information: [address] [place] [address] [phone]               Allergies  Allergen Reactions   Ciprofloxacin Diarrhea and Nausea And Vomiting    stomach pain   Latex Itching   Sulfa Antibiotics Itching and Rash    Consultations: Cardiology   Procedures/Studies: ECHOCARDIOGRAM COMPLETE  Result Date: 11/27/2022    ECHOCARDIOGRAM REPORT   Patient Name:   [name] Date of Exam: 11/27/2022 Medical Rec #:  [id number]        Height:       63.0 in Accession #:    [id number]       Weight:       247.6 lb Date of Birth:  [birth year]         BSA:          2.117 m Patient Age:    72 years         BP:           132/65 mmHg Patient Gender: F                HR:           89 bpm. [facility] Procedure: 2D Echo, Cardiac  Doppler, Color Doppler and Intracardiac            Opacification Agent Indications:    A fib I48.91  History:        Patient has prior history of Echocardiogram examinations, most                 recent 07/31/2022. Stroke; Risk Factors:Hypertension and Diabetes.  Sonographer:    Harriette Bouillon RDCS Referring Phys: [phone] Heloise Beecham EMOKPAE IMPRESSIONS  1. Left ventricular ejection fraction, by estimation, is 60 to 65%. The left ventricle has normal function. The left ventricle has no regional wall motion abnormalities. Left ventricular diastolic parameters were normal.  2. Right ventricular systolic function is normal. The right ventricular size is normal.  3. The mitral valve is normal in structure. No evidence of mitral valve regurgitation. No evidence of mitral stenosis.  4. The aortic valve is tricuspid. Aortic valve regurgitation is not visualized. No aortic stenosis is present.  5. The inferior vena cava is normal in size with greater than 50% respiratory variability, suggesting right atrial pressure of 3 mmHg. FINDINGS  Left Ventricle: Left ventricular ejection fraction, by estimation, is 60 to 65%. The left ventricle has normal function. The left ventricle has no regional wall motion abnormalities. Definity contrast agent was given IV to delineate the left ventricular  endocardial borders. The left ventricular internal cavity size was normal in size. There is no left ventricular hypertrophy. Left ventricular diastolic parameters were normal. Right Ventricle: The right ventricular size is normal. No increase in right ventricular wall thickness. Right ventricular systolic function is normal. Left Atrium: Left atrial size was normal in size. Right Atrium: Right atrial size was normal in size. Pericardium: There is no evidence of pericardial effusion. Mitral Valve: The mitral valve is normal in structure. No evidence of mitral valve regurgitation. No evidence of mitral valve stenosis. Tricuspid Valve: The tricuspid  valve is normal in structure. Tricuspid valve regurgitation is

## 2022-11-30 LAB — CULTURE, BLOOD (ROUTINE X 2): Culture: NO GROWTH

## 2022-12-02 ENCOUNTER — Telehealth: Payer: Self-pay | Admitting: Medical

## 2022-12-02 DIAGNOSIS — Z7985 Long-term (current) use of injectable non-insulin antidiabetic drugs: Secondary | ICD-10-CM | POA: Diagnosis not present

## 2022-12-02 DIAGNOSIS — F319 Bipolar disorder, unspecified: Secondary | ICD-10-CM | POA: Diagnosis not present

## 2022-12-02 DIAGNOSIS — Z9181 History of falling: Secondary | ICD-10-CM | POA: Diagnosis not present

## 2022-12-02 DIAGNOSIS — I2584 Coronary atherosclerosis due to calcified coronary lesion: Secondary | ICD-10-CM | POA: Diagnosis not present

## 2022-12-02 DIAGNOSIS — I700 Atherosclerosis of aorta: Secondary | ICD-10-CM | POA: Diagnosis not present

## 2022-12-02 DIAGNOSIS — I2510 Atherosclerotic heart disease of native coronary artery without angina pectoris: Secondary | ICD-10-CM | POA: Diagnosis not present

## 2022-12-02 DIAGNOSIS — B9620 Unspecified Escherichia coli [E. coli] as the cause of diseases classified elsewhere: Secondary | ICD-10-CM | POA: Diagnosis not present

## 2022-12-02 DIAGNOSIS — I4891 Unspecified atrial fibrillation: Secondary | ICD-10-CM | POA: Diagnosis not present

## 2022-12-02 DIAGNOSIS — K5900 Constipation, unspecified: Secondary | ICD-10-CM | POA: Diagnosis not present

## 2022-12-02 DIAGNOSIS — Z8673 Personal history of transient ischemic attack (TIA), and cerebral infarction without residual deficits: Secondary | ICD-10-CM | POA: Diagnosis not present

## 2022-12-02 DIAGNOSIS — I10 Essential (primary) hypertension: Secondary | ICD-10-CM | POA: Diagnosis not present

## 2022-12-02 DIAGNOSIS — Z7984 Long term (current) use of oral hypoglycemic drugs: Secondary | ICD-10-CM | POA: Diagnosis not present

## 2022-12-02 DIAGNOSIS — Z794 Long term (current) use of insulin: Secondary | ICD-10-CM | POA: Diagnosis not present

## 2022-12-02 DIAGNOSIS — Z6841 Body Mass Index (BMI) 40.0 and over, adult: Secondary | ICD-10-CM | POA: Diagnosis not present

## 2022-12-02 DIAGNOSIS — Z7901 Long term (current) use of anticoagulants: Secondary | ICD-10-CM | POA: Diagnosis not present

## 2022-12-02 DIAGNOSIS — N390 Urinary tract infection, site not specified: Secondary | ICD-10-CM | POA: Diagnosis not present

## 2022-12-02 DIAGNOSIS — Z87891 Personal history of nicotine dependence: Secondary | ICD-10-CM | POA: Diagnosis not present

## 2022-12-02 DIAGNOSIS — E119 Type 2 diabetes mellitus without complications: Secondary | ICD-10-CM | POA: Diagnosis not present

## 2022-12-02 NOTE — Telephone Encounter (Signed)
Spoke to pt who wanted clarification on medications that were changed at discharge from the [facility]opital:  -Stop: *Plavix *Irbesartan *Hydrochlorothiazide  Start: *Eliquis *Duricef *Cardizem  Patient to continue all other medications the same.   Verbalized changes to pt who voiced understanding. Pt had no further questions or concerns at this time.

## 2022-12-02 NOTE — Telephone Encounter (Signed)
New Messagee:     Patient said she would like to talk to a nurse about her post hospital instructions.

## 2022-12-07 DIAGNOSIS — Z7901 Long term (current) use of anticoagulants: Secondary | ICD-10-CM | POA: Diagnosis not present

## 2022-12-07 DIAGNOSIS — K5900 Constipation, unspecified: Secondary | ICD-10-CM | POA: Diagnosis not present

## 2022-12-07 DIAGNOSIS — Z6841 Body Mass Index (BMI) 40.0 and over, adult: Secondary | ICD-10-CM | POA: Diagnosis not present

## 2022-12-07 DIAGNOSIS — I700 Atherosclerosis of aorta: Secondary | ICD-10-CM | POA: Diagnosis not present

## 2022-12-07 DIAGNOSIS — I2510 Atherosclerotic heart disease of native coronary artery without angina pectoris: Secondary | ICD-10-CM | POA: Diagnosis not present

## 2022-12-07 DIAGNOSIS — I2584 Coronary atherosclerosis due to calcified coronary lesion: Secondary | ICD-10-CM | POA: Diagnosis not present

## 2022-12-07 DIAGNOSIS — I10 Essential (primary) hypertension: Secondary | ICD-10-CM | POA: Diagnosis not present

## 2022-12-07 DIAGNOSIS — I4891 Unspecified atrial fibrillation: Secondary | ICD-10-CM | POA: Diagnosis not present

## 2022-12-07 DIAGNOSIS — Z9181 History of falling: Secondary | ICD-10-CM | POA: Diagnosis not present

## 2022-12-07 DIAGNOSIS — B9620 Unspecified Escherichia coli [E. coli] as the cause of diseases classified elsewhere: Secondary | ICD-10-CM | POA: Diagnosis not present

## 2022-12-07 DIAGNOSIS — N390 Urinary tract infection, site not specified: Secondary | ICD-10-CM | POA: Diagnosis not present

## 2022-12-07 DIAGNOSIS — Z7984 Long term (current) use of oral hypoglycemic drugs: Secondary | ICD-10-CM | POA: Diagnosis not present

## 2022-12-07 DIAGNOSIS — F319 Bipolar disorder, unspecified: Secondary | ICD-10-CM | POA: Diagnosis not present

## 2022-12-07 DIAGNOSIS — Z8673 Personal history of transient ischemic attack (TIA), and cerebral infarction without residual deficits: Secondary | ICD-10-CM | POA: Diagnosis not present

## 2022-12-07 DIAGNOSIS — Z7985 Long-term (current) use of injectable non-insulin antidiabetic drugs: Secondary | ICD-10-CM | POA: Diagnosis not present

## 2022-12-07 DIAGNOSIS — E119 Type 2 diabetes mellitus without complications: Secondary | ICD-10-CM | POA: Diagnosis not present

## 2022-12-07 DIAGNOSIS — Z87891 Personal history of nicotine dependence: Secondary | ICD-10-CM | POA: Diagnosis not present

## 2022-12-07 DIAGNOSIS — Z794 Long term (current) use of insulin: Secondary | ICD-10-CM | POA: Diagnosis not present

## 2022-12-08 DIAGNOSIS — I4891 Unspecified atrial fibrillation: Secondary | ICD-10-CM | POA: Diagnosis not present

## 2022-12-08 DIAGNOSIS — Z8673 Personal history of transient ischemic attack (TIA), and cerebral infarction without residual deficits: Secondary | ICD-10-CM | POA: Diagnosis not present

## 2022-12-08 DIAGNOSIS — Z8619 Personal history of other infectious and parasitic diseases: Secondary | ICD-10-CM | POA: Diagnosis not present

## 2022-12-08 DIAGNOSIS — N390 Urinary tract infection, site not specified: Secondary | ICD-10-CM | POA: Diagnosis not present

## 2022-12-08 DIAGNOSIS — I10 Essential (primary) hypertension: Secondary | ICD-10-CM | POA: Diagnosis not present

## 2022-12-09 DIAGNOSIS — N390 Urinary tract infection, site not specified: Secondary | ICD-10-CM | POA: Diagnosis not present

## 2022-12-09 DIAGNOSIS — Z9181 History of falling: Secondary | ICD-10-CM | POA: Diagnosis not present

## 2022-12-09 DIAGNOSIS — I700 Atherosclerosis of aorta: Secondary | ICD-10-CM | POA: Diagnosis not present

## 2022-12-09 DIAGNOSIS — K5900 Constipation, unspecified: Secondary | ICD-10-CM | POA: Diagnosis not present

## 2022-12-09 DIAGNOSIS — Z8673 Personal history of transient ischemic attack (TIA), and cerebral infarction without residual deficits: Secondary | ICD-10-CM | POA: Diagnosis not present

## 2022-12-09 DIAGNOSIS — F319 Bipolar disorder, unspecified: Secondary | ICD-10-CM | POA: Diagnosis not present

## 2022-12-09 DIAGNOSIS — Z6841 Body Mass Index (BMI) 40.0 and over, adult: Secondary | ICD-10-CM | POA: Diagnosis not present

## 2022-12-09 DIAGNOSIS — Z7984 Long term (current) use of oral hypoglycemic drugs: Secondary | ICD-10-CM | POA: Diagnosis not present

## 2022-12-09 DIAGNOSIS — Z87891 Personal history of nicotine dependence: Secondary | ICD-10-CM | POA: Diagnosis not present

## 2022-12-09 DIAGNOSIS — I4891 Unspecified atrial fibrillation: Secondary | ICD-10-CM | POA: Diagnosis not present

## 2022-12-09 DIAGNOSIS — Z794 Long term (current) use of insulin: Secondary | ICD-10-CM | POA: Diagnosis not present

## 2022-12-09 DIAGNOSIS — B9620 Unspecified Escherichia coli [E. coli] as the cause of diseases classified elsewhere: Secondary | ICD-10-CM | POA: Diagnosis not present

## 2022-12-09 DIAGNOSIS — I2584 Coronary atherosclerosis due to calcified coronary lesion: Secondary | ICD-10-CM | POA: Diagnosis not present

## 2022-12-09 DIAGNOSIS — I2510 Atherosclerotic heart disease of native coronary artery without angina pectoris: Secondary | ICD-10-CM | POA: Diagnosis not present

## 2022-12-09 DIAGNOSIS — E119 Type 2 diabetes mellitus without complications: Secondary | ICD-10-CM | POA: Diagnosis not present

## 2022-12-09 DIAGNOSIS — I10 Essential (primary) hypertension: Secondary | ICD-10-CM | POA: Diagnosis not present

## 2022-12-09 DIAGNOSIS — Z7901 Long term (current) use of anticoagulants: Secondary | ICD-10-CM | POA: Diagnosis not present

## 2022-12-09 DIAGNOSIS — Z7985 Long-term (current) use of injectable non-insulin antidiabetic drugs: Secondary | ICD-10-CM | POA: Diagnosis not present

## 2022-12-10 ENCOUNTER — Telehealth: Payer: Self-pay | Admitting: Cardiology

## 2022-12-10 NOTE — Telephone Encounter (Signed)
Hospital d/c instructions tell her to stop Hydrochlorothiazide, but the medication list has Potassium listed and to take w/ her Hydrochlorothiazide in the a.m.  Pt advised to not take the Potassium as she is not taking the Hydrochlorothiazide. Aware will discuss her tx plan at 10/23 w/ Dr. Jenene Slicker. Patient verbalized understanding and agreeable to plan.

## 2022-12-10 NOTE — Telephone Encounter (Signed)
Left voicemail to return call to office.

## 2022-12-10 NOTE — Telephone Encounter (Signed)
Patient returned staff call regarding clarifying medication instructions.

## 2022-12-10 NOTE — Telephone Encounter (Signed)
Patient calling with question concerning medication she is suppose to take and the what she shouldn't take. Please advise

## 2022-12-14 DIAGNOSIS — I10 Essential (primary) hypertension: Secondary | ICD-10-CM | POA: Diagnosis not present

## 2022-12-14 DIAGNOSIS — Z7985 Long-term (current) use of injectable non-insulin antidiabetic drugs: Secondary | ICD-10-CM | POA: Diagnosis not present

## 2022-12-14 DIAGNOSIS — Z8673 Personal history of transient ischemic attack (TIA), and cerebral infarction without residual deficits: Secondary | ICD-10-CM | POA: Diagnosis not present

## 2022-12-14 DIAGNOSIS — B9620 Unspecified Escherichia coli [E. coli] as the cause of diseases classified elsewhere: Secondary | ICD-10-CM | POA: Diagnosis not present

## 2022-12-14 DIAGNOSIS — Z87891 Personal history of nicotine dependence: Secondary | ICD-10-CM | POA: Diagnosis not present

## 2022-12-14 DIAGNOSIS — Z6841 Body Mass Index (BMI) 40.0 and over, adult: Secondary | ICD-10-CM | POA: Diagnosis not present

## 2022-12-14 DIAGNOSIS — Z9181 History of falling: Secondary | ICD-10-CM | POA: Diagnosis not present

## 2022-12-14 DIAGNOSIS — I700 Atherosclerosis of aorta: Secondary | ICD-10-CM | POA: Diagnosis not present

## 2022-12-14 DIAGNOSIS — F319 Bipolar disorder, unspecified: Secondary | ICD-10-CM | POA: Diagnosis not present

## 2022-12-14 DIAGNOSIS — K5900 Constipation, unspecified: Secondary | ICD-10-CM | POA: Diagnosis not present

## 2022-12-14 DIAGNOSIS — I4891 Unspecified atrial fibrillation: Secondary | ICD-10-CM | POA: Diagnosis not present

## 2022-12-14 DIAGNOSIS — Z7901 Long term (current) use of anticoagulants: Secondary | ICD-10-CM | POA: Diagnosis not present

## 2022-12-14 DIAGNOSIS — I2510 Atherosclerotic heart disease of native coronary artery without angina pectoris: Secondary | ICD-10-CM | POA: Diagnosis not present

## 2022-12-14 DIAGNOSIS — Z7984 Long term (current) use of oral hypoglycemic drugs: Secondary | ICD-10-CM | POA: Diagnosis not present

## 2022-12-14 DIAGNOSIS — I2584 Coronary atherosclerosis due to calcified coronary lesion: Secondary | ICD-10-CM | POA: Diagnosis not present

## 2022-12-14 DIAGNOSIS — Z794 Long term (current) use of insulin: Secondary | ICD-10-CM | POA: Diagnosis not present

## 2022-12-14 DIAGNOSIS — N390 Urinary tract infection, site not specified: Secondary | ICD-10-CM | POA: Diagnosis not present

## 2022-12-14 DIAGNOSIS — E119 Type 2 diabetes mellitus without complications: Secondary | ICD-10-CM | POA: Diagnosis not present

## 2022-12-15 ENCOUNTER — Encounter: Payer: Self-pay | Admitting: Internal Medicine

## 2022-12-15 ENCOUNTER — Ambulatory Visit: Payer: Medicare HMO | Attending: Internal Medicine | Admitting: Internal Medicine

## 2022-12-15 VITALS — BP 146/78 | HR 85 | Ht 63.0 in | Wt 245.0 lb

## 2022-12-15 DIAGNOSIS — I10 Essential (primary) hypertension: Secondary | ICD-10-CM | POA: Diagnosis not present

## 2022-12-15 DIAGNOSIS — I4891 Unspecified atrial fibrillation: Secondary | ICD-10-CM

## 2022-12-15 DIAGNOSIS — I480 Paroxysmal atrial fibrillation: Secondary | ICD-10-CM | POA: Diagnosis not present

## 2022-12-15 DIAGNOSIS — E7849 Other hyperlipidemia: Secondary | ICD-10-CM | POA: Diagnosis not present

## 2022-12-15 DIAGNOSIS — R079 Chest pain, unspecified: Secondary | ICD-10-CM | POA: Diagnosis not present

## 2022-12-15 DIAGNOSIS — E785 Hyperlipidemia, unspecified: Secondary | ICD-10-CM | POA: Insufficient documentation

## 2022-12-15 NOTE — Patient Instructions (Addendum)
Medication Instructions:  Your physician recommends that you continue on your current medications as directed. Please refer to the Current Medication list given to you today.   Labwork: None  Testing/Procedures: Your physician has requested that you have a lexiscan myoview. For further information please visit [email or web address] Please follow instruction sheet, as given.   Follow-Up: Your physician recommends that you schedule a follow-up appointment in: 3 months  Any Other Special Instructions Will Be Listed Below (If Applicable).  Thank you for choosing [facility]!      If you need a refill on your cardiac medications before your next appointment, please call your pharmacy.

## 2022-12-15 NOTE — Progress Notes (Signed)
Cardiology Office Note  Date: 12/15/2022   ID: XAVIER FORRISTER, DOB 09-18-1950, MRN [id number]  PCP:  [facility]  Cardiologist:  None Electrophysiologist:  None   History of Present Illness: [name] is a 72 y.o. female known to have paroxysmal A-fib, HTN, DM 2, HLD, history of CVA was referred to cardiology clinic for post hospitalization follow-up.  Overall doing great, no symptoms.  Had chest pressure twice with exertion, lasted for a few minutes. She has SL NTG 0.4 mg tablets with her. Did not take them. Has chronic stable SOB, no DOE. No orthopnea, PND, leg swelling.  No palpitations.   Past Medical History:  Diagnosis Date   Bipolar 1 disorder (HCC)    Cancer (HCC)    Diabetes mellitus without complication (HCC)    Hypertension    PONV (postoperative nausea and vomiting)    Stroke [facility])     Past Surgical History:  Procedure Laterality Date   ABDOMINAL HYSTERECTOMY     CHOLECYSTECTOMY     COLONOSCOPY WITH PROPOFOL N/A 10/06/2020   Surgeon: Lanelle Bal, DO; nonbleeding internal hemorrhoids, 5 mm tubular adenoma removed, 3 mm hyperplastic polyp removed, otherwise normal exam.  Recommended 5-year surveillance.   ORIF FEMUR FRACTURE Left 11/30/2016   Procedure: OPEN REDUCTION INTERNAL FIXATION (ORIF) DISTAL FEMUR FRACTURE;  Surgeon: Samson Frederic, MD;  Location: [facility] OR;  Service: Orthopedics;  Laterality: Left;   POLYPECTOMY  10/06/2020   Procedure: POLYPECTOMY INTESTINAL;  Surgeon: Lanelle Bal, DO;  Location: [facility] ENDO SUITE;  Service: Endoscopy;;   stroke     TONSILLECTOMY      Current Outpatient Medications  Medication Sig Dispense Refill   acetaminophen (TYLENOL) 650 MG CR tablet Take 1,300 mg by mouth every 8 (eight) hours as needed for pain. BID     albuterol (VENTOLIN HFA) 108 (90 Base) MCG/ACT inhaler Inhale 2 puffs into the lungs every 8 (eight) hours as needed for shortness of breath or wheezing. 18 g 3   apixaban (ELIQUIS) 5  MG TABS tablet Take 1 tablet (5 mg total) by mouth 2 (two) times daily. 60 tablet 2   atorvastatin (LIPITOR) 40 MG tablet Take 1 tablet (40 mg total) by mouth daily. 30 tablet 11   BYDUREON 2 MG PEN Inject 2 mg as directed. Every Sunday.  0   cyclobenzaprine (FLEXERIL) 10 MG tablet Take 1 tablet (10 mg total) by mouth 2 (two) times daily as needed for muscle spasms. 20 tablet 0   dicyclomine (BENTYL) 10 MG capsule Take 1 capsule (10 mg total) by mouth 3 (three) times daily as needed (abdominal cramping or diarrhea. Hold in setting of constipation.). 120 capsule 1   diltiazem (CARDIZEM CD) 120 MG 24 hr capsule Take 1 capsule (120 mg total) by mouth daily. 30 capsule 1   famotidine (PEPCID) 20 MG tablet One after supper (Patient taking differently: Take 20 mg by mouth daily.) 30 tablet 3   fluticasone (FLONASE) 50 MCG/ACT nasal spray Place 2 sprays into both nostrils daily.     gabapentin (NEURONTIN) 300 MG capsule Take 300 mg by mouth 3 (three) times daily.     Insulin Glargine (BASAGLAR KWIKPEN) 100 UNIT/ML Inject 18 Units into the skin at bedtime.     Insulin Pen Needle 31G X 5 MM MISC by Does not apply route.     linaclotide (LINZESS) 290 MCG CAPS capsule TAKE 1 CAPSULE BY MOUTH ONCE DAILY BEFORE BREAKFAST (Patient taking differently: Take 290 mcg by  mouth daily before breakfast.) 90 capsule 3   meclizine (ANTIVERT) 25 MG tablet Take 25 mg by mouth 2 (two) times daily as needed for dizziness.     metFORMIN (GLUCOPHAGE) 500 MG tablet Take 500 mg by mouth 2 (two) times daily with a meal.     metoprolol tartrate (LOPRESSOR) 25 MG tablet Take 1 tablet (25 mg total) by mouth 2 (two) times daily. 60 tablet 1   montelukast (SINGULAIR) 10 MG tablet Take 10 mg by mouth at bedtime.     nitroGLYCERIN (NITROSTAT) 0.4 MG SL tablet Place 1 tablet (0.4 mg total) under the tongue every 5 (five) minutes x 3 doses as needed for chest pain. 25 tablet 2   pantoprazole (PROTONIX) 40 MG tablet Take 40 mg by mouth  daily.  3   sodium chloride (OCEAN) 0.65 % SOLN nasal spray Place 1 spray into both nostrils as needed for congestion.     VASCEPA 1 g capsule Take 2 g by mouth 2 (two) times daily.     albuterol (PROVENTIL) (2.5 MG/3ML) 0.083% nebulizer solution Take 3 mLs (2.5 mg total) by nebulization every 4 (four) hours as needed for wheezing or shortness of breath. (Patient not taking: Reported on 12/15/2022) 75 mL 2   OVER THE COUNTER MEDICATION One touch Verio Reflect glucose machine.  One touch Verio Reflect glucose test strips 1-2 daily  One touch delica plus Lancets one lancet to check glucose bid.     No current facility-administered medications for this visit.   Allergies:  Ciprofloxacin, Latex, and Sulfa antibiotics   Social History: The patient  reports that she quit smoking about 22 years ago. Her smoking use included cigarettes. She started smoking about 42 years ago. She has a 60 pack-year smoking history. She has never used smokeless tobacco. She reports that she does not drink alcohol and does not use drugs.   Family History: The patient's family history includes CAD in her brother and father.   ROS:  Please see the history of present illness. Otherwise, complete review of systems is positive for none.  All other systems are reviewed and negative.   Physical Exam: VS:  BP (!) 146/78 (BP Location: Left Arm, Patient Position: Sitting, Cuff Size: Normal)   Pulse 85   Ht 5\' 3"  (1.6 m)   Wt 245 lb (111.1 kg)   SpO2 92%   BMI 43.40 kg/m , BMI Body mass index is 43.4 kg/m.  Wt Readings from Last 3 Encounters:  12/15/22 245 lb (111.1 kg)  11/26/22 247 lb 9.2 oz (112.3 kg)  08/30/22 260 lb 12.9 oz (118.3 kg)    General: Patient appears comfortable at rest. HEENT: Conjunctiva and lids normal, oropharynx clear with moist mucosa. Neck: Supple, no elevated JVP or carotid bruits, no thyromegaly. Lungs: Clear to auscultation, nonlabored breathing at rest. Cardiac: Regular rate and rhythm,  no S3 or significant systolic murmur, no pericardial rub. Abdomen: Soft, nontender, no hepatomegaly, bowel sounds present, no guarding or rebound. Extremities: No pitting edema, distal pulses 2+. Skin: Warm and dry. Musculoskeletal: No kyphosis. Neuropsychiatric: Alert and oriented x3, affect grossly appropriate.  Recent Labwork: 11/25/2022: TSH 0.277 11/29/2022: ALT 38; AST 33; BUN 14; Creatinine, Ser 1.21; Hemoglobin 9.7; Magnesium 1.5; Platelets 642; Potassium 3.5; Sodium 136     Component Value Date/Time   CHOL 131 08/10/2022 0417   TRIG 157 (H) 08/10/2022 0417   HDL 41 08/10/2022 0417   CHOLHDL 3.2 08/10/2022 0417   VLDL 31 08/10/2022 0417  LDLCALC 59 08/10/2022 0417     Assessment and Plan:  Cardiac chest pain Paroxysmal A-fib History of CVA HTN, controlled HLD, at goal   -Since discharge from the hospital, she had 2 episodes of chest pressure with exertion that lasted for few minutes.  Will obtain Lexiscan.  Echocardiogram was normal. -Recently admitted to [facility] in 11/2022 with sepsis, found to have new onset A-fib with RVR, spontaneous converted to NSR on rate controlling medications. Continue metoprolol tartrate 25 mg twice daily, diltiazem 120 mg once daily and Eliquis 5 mg twice daily. -Continue antihypertensive medications as stated above.   I have spent a total duration 30 minutes reviewing the prior notes, labs, EKG, face-to-face discussion/counseling of her medical condition, evaluation, management, reviewed her medications, ordering tests and documenting the findings in the note.  Medication Adjustments/Labs and Tests Ordered: Current medicines are reviewed at length with the patient today.  Concerns regarding medicines are outlined above.    Disposition:  Follow up 3 months  Signed, Jayan Raymundo Verne Spurr, MD, 12/15/2022 3:28 PM    [facility] at [facility] [address], [place], [place] [address]

## 2022-12-16 DIAGNOSIS — I2584 Coronary atherosclerosis due to calcified coronary lesion: Secondary | ICD-10-CM | POA: Diagnosis not present

## 2022-12-16 DIAGNOSIS — I2510 Atherosclerotic heart disease of native coronary artery without angina pectoris: Secondary | ICD-10-CM | POA: Diagnosis not present

## 2022-12-16 DIAGNOSIS — K5900 Constipation, unspecified: Secondary | ICD-10-CM | POA: Diagnosis not present

## 2022-12-16 DIAGNOSIS — E119 Type 2 diabetes mellitus without complications: Secondary | ICD-10-CM | POA: Diagnosis not present

## 2022-12-16 DIAGNOSIS — I4891 Unspecified atrial fibrillation: Secondary | ICD-10-CM | POA: Diagnosis not present

## 2022-12-16 DIAGNOSIS — I10 Essential (primary) hypertension: Secondary | ICD-10-CM | POA: Diagnosis not present

## 2022-12-16 DIAGNOSIS — Z7901 Long term (current) use of anticoagulants: Secondary | ICD-10-CM | POA: Diagnosis not present

## 2022-12-16 DIAGNOSIS — N390 Urinary tract infection, site not specified: Secondary | ICD-10-CM | POA: Diagnosis not present

## 2022-12-16 DIAGNOSIS — B9620 Unspecified Escherichia coli [E. coli] as the cause of diseases classified elsewhere: Secondary | ICD-10-CM | POA: Diagnosis not present

## 2022-12-16 DIAGNOSIS — Z8673 Personal history of transient ischemic attack (TIA), and cerebral infarction without residual deficits: Secondary | ICD-10-CM | POA: Diagnosis not present

## 2022-12-16 DIAGNOSIS — Z7985 Long-term (current) use of injectable non-insulin antidiabetic drugs: Secondary | ICD-10-CM | POA: Diagnosis not present

## 2022-12-16 DIAGNOSIS — Z794 Long term (current) use of insulin: Secondary | ICD-10-CM | POA: Diagnosis not present

## 2022-12-16 DIAGNOSIS — Z9181 History of falling: Secondary | ICD-10-CM | POA: Diagnosis not present

## 2022-12-16 DIAGNOSIS — Z7984 Long term (current) use of oral hypoglycemic drugs: Secondary | ICD-10-CM | POA: Diagnosis not present

## 2022-12-16 DIAGNOSIS — Z87891 Personal history of nicotine dependence: Secondary | ICD-10-CM | POA: Diagnosis not present

## 2022-12-16 DIAGNOSIS — F319 Bipolar disorder, unspecified: Secondary | ICD-10-CM | POA: Diagnosis not present

## 2022-12-16 DIAGNOSIS — Z6841 Body Mass Index (BMI) 40.0 and over, adult: Secondary | ICD-10-CM | POA: Diagnosis not present

## 2022-12-16 DIAGNOSIS — I700 Atherosclerosis of aorta: Secondary | ICD-10-CM | POA: Diagnosis not present

## 2022-12-20 DIAGNOSIS — N390 Urinary tract infection, site not specified: Secondary | ICD-10-CM | POA: Diagnosis not present

## 2022-12-20 DIAGNOSIS — Z87891 Personal history of nicotine dependence: Secondary | ICD-10-CM | POA: Diagnosis not present

## 2022-12-20 DIAGNOSIS — Z8673 Personal history of transient ischemic attack (TIA), and cerebral infarction without residual deficits: Secondary | ICD-10-CM | POA: Diagnosis not present

## 2022-12-20 DIAGNOSIS — I2584 Coronary atherosclerosis due to calcified coronary lesion: Secondary | ICD-10-CM | POA: Diagnosis not present

## 2022-12-20 DIAGNOSIS — E119 Type 2 diabetes mellitus without complications: Secondary | ICD-10-CM | POA: Diagnosis not present

## 2022-12-20 DIAGNOSIS — I2510 Atherosclerotic heart disease of native coronary artery without angina pectoris: Secondary | ICD-10-CM | POA: Diagnosis not present

## 2022-12-20 DIAGNOSIS — F319 Bipolar disorder, unspecified: Secondary | ICD-10-CM | POA: Diagnosis not present

## 2022-12-20 DIAGNOSIS — I4891 Unspecified atrial fibrillation: Secondary | ICD-10-CM | POA: Diagnosis not present

## 2022-12-20 DIAGNOSIS — Z7984 Long term (current) use of oral hypoglycemic drugs: Secondary | ICD-10-CM | POA: Diagnosis not present

## 2022-12-20 DIAGNOSIS — Z7985 Long-term (current) use of injectable non-insulin antidiabetic drugs: Secondary | ICD-10-CM | POA: Diagnosis not present

## 2022-12-20 DIAGNOSIS — B9620 Unspecified Escherichia coli [E. coli] as the cause of diseases classified elsewhere: Secondary | ICD-10-CM | POA: Diagnosis not present

## 2022-12-20 DIAGNOSIS — Z7901 Long term (current) use of anticoagulants: Secondary | ICD-10-CM | POA: Diagnosis not present

## 2022-12-20 DIAGNOSIS — Z9181 History of falling: Secondary | ICD-10-CM | POA: Diagnosis not present

## 2022-12-20 DIAGNOSIS — I700 Atherosclerosis of aorta: Secondary | ICD-10-CM | POA: Diagnosis not present

## 2022-12-20 DIAGNOSIS — Z6841 Body Mass Index (BMI) 40.0 and over, adult: Secondary | ICD-10-CM | POA: Diagnosis not present

## 2022-12-20 DIAGNOSIS — Z794 Long term (current) use of insulin: Secondary | ICD-10-CM | POA: Diagnosis not present

## 2022-12-20 DIAGNOSIS — I10 Essential (primary) hypertension: Secondary | ICD-10-CM | POA: Diagnosis not present

## 2022-12-20 DIAGNOSIS — K5900 Constipation, unspecified: Secondary | ICD-10-CM | POA: Diagnosis not present

## 2022-12-21 DIAGNOSIS — N3000 Acute cystitis without hematuria: Secondary | ICD-10-CM | POA: Diagnosis not present

## 2022-12-22 ENCOUNTER — Encounter (HOSPITAL_BASED_OUTPATIENT_CLINIC_OR_DEPARTMENT_OTHER)
Admission: RE | Admit: 2022-12-22 | Discharge: 2022-12-22 | Disposition: A | Discharge status: Home / Self Care | Payer: Medicare HMO | Source: Ambulatory Visit | Attending: Internal Medicine | Admitting: Internal Medicine

## 2022-12-22 ENCOUNTER — Ambulatory Visit (HOSPITAL_COMMUNITY)
Admission: RE | Admit: 2022-12-22 | Discharge: 2022-12-22 | Disposition: A | Discharge status: Home / Self Care | Payer: Medicare HMO | Source: Ambulatory Visit | Attending: Internal Medicine | Admitting: Internal Medicine

## 2022-12-22 ENCOUNTER — Encounter (HOSPITAL_COMMUNITY): Payer: Self-pay

## 2022-12-22 DIAGNOSIS — R079 Chest pain, unspecified: Secondary | ICD-10-CM | POA: Diagnosis not present

## 2022-12-22 LAB — NM MYOCAR MULTI W/SPECT W/WALL MOTION / EF
Base ST Depression (mm): 0 mm
LV dias vol: 58 mL (ref 46–106)
LV sys vol: 13 mL
Nuc Stress EF: 77 %
Peak HR: 95 {beats}/min
RATE: 0.4
Rest HR: 73 {beats}/min
Rest Nuclear Isotope Dose: 9.7 mCi
SDS: 3
SRS: 3
SSS: 6
ST Depression (mm): 0 mm
Stress Nuclear Isotope Dose: 27.2 mCi
TID: 1.22

## 2022-12-22 MED ORDER — SODIUM CHLORIDE FLUSH 0.9 % IV SOLN
INTRAVENOUS | Status: AC
  Administered 2022-12-22: 10 mL via INTRAVENOUS
  Filled 2022-12-22: qty 10

## 2022-12-22 MED ORDER — TECHNETIUM TC 99M TETROFOSMIN IV KIT
10.0000 | PACK | Freq: Once | INTRAVENOUS | Status: AC | PRN
  Administered 2022-12-22: 9.7 via INTRAVENOUS

## 2022-12-22 MED ORDER — TECHNETIUM TC 99M TETROFOSMIN IV KIT
30.0000 | PACK | Freq: Once | INTRAVENOUS | Status: AC | PRN
  Administered 2022-12-22: 27.2 via INTRAVENOUS

## 2022-12-22 MED ORDER — REGADENOSON 0.4 MG/5ML IV SOLN
INTRAVENOUS | Status: AC
  Administered 2022-12-22: 0.4 mg via INTRAVENOUS
  Filled 2022-12-22: qty 5

## 2022-12-24 ENCOUNTER — Telehealth: Payer: Self-pay

## 2022-12-24 NOTE — Telephone Encounter (Signed)
-----   Message from [name] sent at 12/23/2022 11:19 AM EDT ----- Normal stress test.

## 2022-12-24 NOTE — Telephone Encounter (Signed)
Patient informed and verbalized understanding of plan. 

## 2022-12-28 DIAGNOSIS — E782 Mixed hyperlipidemia: Secondary | ICD-10-CM | POA: Diagnosis not present

## 2022-12-28 DIAGNOSIS — N3000 Acute cystitis without hematuria: Secondary | ICD-10-CM | POA: Diagnosis not present

## 2022-12-29 DIAGNOSIS — I700 Atherosclerosis of aorta: Secondary | ICD-10-CM | POA: Diagnosis not present

## 2022-12-29 DIAGNOSIS — Z7984 Long term (current) use of oral hypoglycemic drugs: Secondary | ICD-10-CM | POA: Diagnosis not present

## 2022-12-29 DIAGNOSIS — Z87891 Personal history of nicotine dependence: Secondary | ICD-10-CM | POA: Diagnosis not present

## 2022-12-29 DIAGNOSIS — I10 Essential (primary) hypertension: Secondary | ICD-10-CM | POA: Diagnosis not present

## 2022-12-29 DIAGNOSIS — Z7901 Long term (current) use of anticoagulants: Secondary | ICD-10-CM | POA: Diagnosis not present

## 2022-12-29 DIAGNOSIS — E119 Type 2 diabetes mellitus without complications: Secondary | ICD-10-CM | POA: Diagnosis not present

## 2022-12-29 DIAGNOSIS — I2510 Atherosclerotic heart disease of native coronary artery without angina pectoris: Secondary | ICD-10-CM | POA: Diagnosis not present

## 2022-12-29 DIAGNOSIS — Z794 Long term (current) use of insulin: Secondary | ICD-10-CM | POA: Diagnosis not present

## 2022-12-29 DIAGNOSIS — Z7985 Long-term (current) use of injectable non-insulin antidiabetic drugs: Secondary | ICD-10-CM | POA: Diagnosis not present

## 2022-12-29 DIAGNOSIS — Z8673 Personal history of transient ischemic attack (TIA), and cerebral infarction without residual deficits: Secondary | ICD-10-CM | POA: Diagnosis not present

## 2022-12-29 DIAGNOSIS — B9620 Unspecified Escherichia coli [E. coli] as the cause of diseases classified elsewhere: Secondary | ICD-10-CM | POA: Diagnosis not present

## 2022-12-29 DIAGNOSIS — Z6841 Body Mass Index (BMI) 40.0 and over, adult: Secondary | ICD-10-CM | POA: Diagnosis not present

## 2022-12-29 DIAGNOSIS — F319 Bipolar disorder, unspecified: Secondary | ICD-10-CM | POA: Diagnosis not present

## 2022-12-29 DIAGNOSIS — I4891 Unspecified atrial fibrillation: Secondary | ICD-10-CM | POA: Diagnosis not present

## 2022-12-29 DIAGNOSIS — N390 Urinary tract infection, site not specified: Secondary | ICD-10-CM | POA: Diagnosis not present

## 2022-12-29 DIAGNOSIS — K5900 Constipation, unspecified: Secondary | ICD-10-CM | POA: Diagnosis not present

## 2022-12-29 DIAGNOSIS — I2584 Coronary atherosclerosis due to calcified coronary lesion: Secondary | ICD-10-CM | POA: Diagnosis not present

## 2022-12-29 DIAGNOSIS — Z9181 History of falling: Secondary | ICD-10-CM | POA: Diagnosis not present

## 2023-01-05 DIAGNOSIS — Z7985 Long-term (current) use of injectable non-insulin antidiabetic drugs: Secondary | ICD-10-CM | POA: Diagnosis not present

## 2023-01-05 DIAGNOSIS — Z7984 Long term (current) use of oral hypoglycemic drugs: Secondary | ICD-10-CM | POA: Diagnosis not present

## 2023-01-05 DIAGNOSIS — Z794 Long term (current) use of insulin: Secondary | ICD-10-CM | POA: Diagnosis not present

## 2023-01-05 DIAGNOSIS — B9620 Unspecified Escherichia coli [E. coli] as the cause of diseases classified elsewhere: Secondary | ICD-10-CM | POA: Diagnosis not present

## 2023-01-05 DIAGNOSIS — I4891 Unspecified atrial fibrillation: Secondary | ICD-10-CM | POA: Diagnosis not present

## 2023-01-05 DIAGNOSIS — Z87891 Personal history of nicotine dependence: Secondary | ICD-10-CM | POA: Diagnosis not present

## 2023-01-05 DIAGNOSIS — I10 Essential (primary) hypertension: Secondary | ICD-10-CM | POA: Diagnosis not present

## 2023-01-05 DIAGNOSIS — K5900 Constipation, unspecified: Secondary | ICD-10-CM | POA: Diagnosis not present

## 2023-01-05 DIAGNOSIS — I700 Atherosclerosis of aorta: Secondary | ICD-10-CM | POA: Diagnosis not present

## 2023-01-05 DIAGNOSIS — I2584 Coronary atherosclerosis due to calcified coronary lesion: Secondary | ICD-10-CM | POA: Diagnosis not present

## 2023-01-05 DIAGNOSIS — Z6841 Body Mass Index (BMI) 40.0 and over, adult: Secondary | ICD-10-CM | POA: Diagnosis not present

## 2023-01-05 DIAGNOSIS — N390 Urinary tract infection, site not specified: Secondary | ICD-10-CM | POA: Diagnosis not present

## 2023-01-05 DIAGNOSIS — Z8673 Personal history of transient ischemic attack (TIA), and cerebral infarction without residual deficits: Secondary | ICD-10-CM | POA: Diagnosis not present

## 2023-01-05 DIAGNOSIS — F319 Bipolar disorder, unspecified: Secondary | ICD-10-CM | POA: Diagnosis not present

## 2023-01-05 DIAGNOSIS — Z7901 Long term (current) use of anticoagulants: Secondary | ICD-10-CM | POA: Diagnosis not present

## 2023-01-05 DIAGNOSIS — E119 Type 2 diabetes mellitus without complications: Secondary | ICD-10-CM | POA: Diagnosis not present

## 2023-01-05 DIAGNOSIS — Z9181 History of falling: Secondary | ICD-10-CM | POA: Diagnosis not present

## 2023-01-05 DIAGNOSIS — I2510 Atherosclerotic heart disease of native coronary artery without angina pectoris: Secondary | ICD-10-CM | POA: Diagnosis not present

## 2023-01-06 DIAGNOSIS — Z7984 Long term (current) use of oral hypoglycemic drugs: Secondary | ICD-10-CM | POA: Diagnosis not present

## 2023-01-06 DIAGNOSIS — I2510 Atherosclerotic heart disease of native coronary artery without angina pectoris: Secondary | ICD-10-CM | POA: Diagnosis not present

## 2023-01-06 DIAGNOSIS — Z794 Long term (current) use of insulin: Secondary | ICD-10-CM | POA: Diagnosis not present

## 2023-01-06 DIAGNOSIS — B9620 Unspecified Escherichia coli [E. coli] as the cause of diseases classified elsewhere: Secondary | ICD-10-CM | POA: Diagnosis not present

## 2023-01-06 DIAGNOSIS — I2584 Coronary atherosclerosis due to calcified coronary lesion: Secondary | ICD-10-CM | POA: Diagnosis not present

## 2023-01-06 DIAGNOSIS — Z7901 Long term (current) use of anticoagulants: Secondary | ICD-10-CM | POA: Diagnosis not present

## 2023-01-06 DIAGNOSIS — E119 Type 2 diabetes mellitus without complications: Secondary | ICD-10-CM | POA: Diagnosis not present

## 2023-01-06 DIAGNOSIS — Z7985 Long-term (current) use of injectable non-insulin antidiabetic drugs: Secondary | ICD-10-CM | POA: Diagnosis not present

## 2023-01-06 DIAGNOSIS — Z87891 Personal history of nicotine dependence: Secondary | ICD-10-CM | POA: Diagnosis not present

## 2023-01-06 DIAGNOSIS — Z8673 Personal history of transient ischemic attack (TIA), and cerebral infarction without residual deficits: Secondary | ICD-10-CM | POA: Diagnosis not present

## 2023-01-06 DIAGNOSIS — N390 Urinary tract infection, site not specified: Secondary | ICD-10-CM | POA: Diagnosis not present

## 2023-01-06 DIAGNOSIS — Z6841 Body Mass Index (BMI) 40.0 and over, adult: Secondary | ICD-10-CM | POA: Diagnosis not present

## 2023-01-06 DIAGNOSIS — I10 Essential (primary) hypertension: Secondary | ICD-10-CM | POA: Diagnosis not present

## 2023-01-06 DIAGNOSIS — K5900 Constipation, unspecified: Secondary | ICD-10-CM | POA: Diagnosis not present

## 2023-01-06 DIAGNOSIS — I4891 Unspecified atrial fibrillation: Secondary | ICD-10-CM | POA: Diagnosis not present

## 2023-01-06 DIAGNOSIS — Z9181 History of falling: Secondary | ICD-10-CM | POA: Diagnosis not present

## 2023-01-06 DIAGNOSIS — F319 Bipolar disorder, unspecified: Secondary | ICD-10-CM | POA: Diagnosis not present

## 2023-01-06 DIAGNOSIS — I700 Atherosclerosis of aorta: Secondary | ICD-10-CM | POA: Diagnosis not present

## 2023-01-07 DIAGNOSIS — Z794 Long term (current) use of insulin: Secondary | ICD-10-CM | POA: Diagnosis not present

## 2023-01-07 DIAGNOSIS — Z8673 Personal history of transient ischemic attack (TIA), and cerebral infarction without residual deficits: Secondary | ICD-10-CM | POA: Diagnosis not present

## 2023-01-07 DIAGNOSIS — F319 Bipolar disorder, unspecified: Secondary | ICD-10-CM | POA: Diagnosis not present

## 2023-01-07 DIAGNOSIS — I700 Atherosclerosis of aorta: Secondary | ICD-10-CM | POA: Diagnosis not present

## 2023-01-07 DIAGNOSIS — N390 Urinary tract infection, site not specified: Secondary | ICD-10-CM | POA: Diagnosis not present

## 2023-01-07 DIAGNOSIS — Z7901 Long term (current) use of anticoagulants: Secondary | ICD-10-CM | POA: Diagnosis not present

## 2023-01-07 DIAGNOSIS — Z6841 Body Mass Index (BMI) 40.0 and over, adult: Secondary | ICD-10-CM | POA: Diagnosis not present

## 2023-01-07 DIAGNOSIS — Z87891 Personal history of nicotine dependence: Secondary | ICD-10-CM | POA: Diagnosis not present

## 2023-01-07 DIAGNOSIS — Z7984 Long term (current) use of oral hypoglycemic drugs: Secondary | ICD-10-CM | POA: Diagnosis not present

## 2023-01-07 DIAGNOSIS — E119 Type 2 diabetes mellitus without complications: Secondary | ICD-10-CM | POA: Diagnosis not present

## 2023-01-07 DIAGNOSIS — B9620 Unspecified Escherichia coli [E. coli] as the cause of diseases classified elsewhere: Secondary | ICD-10-CM | POA: Diagnosis not present

## 2023-01-07 DIAGNOSIS — Z9181 History of falling: Secondary | ICD-10-CM | POA: Diagnosis not present

## 2023-01-07 DIAGNOSIS — I4891 Unspecified atrial fibrillation: Secondary | ICD-10-CM | POA: Diagnosis not present

## 2023-01-07 DIAGNOSIS — K5900 Constipation, unspecified: Secondary | ICD-10-CM | POA: Diagnosis not present

## 2023-01-07 DIAGNOSIS — I10 Essential (primary) hypertension: Secondary | ICD-10-CM | POA: Diagnosis not present

## 2023-01-07 DIAGNOSIS — Z7985 Long-term (current) use of injectable non-insulin antidiabetic drugs: Secondary | ICD-10-CM | POA: Diagnosis not present

## 2023-01-07 DIAGNOSIS — I2510 Atherosclerotic heart disease of native coronary artery without angina pectoris: Secondary | ICD-10-CM | POA: Diagnosis not present

## 2023-01-07 DIAGNOSIS — I2584 Coronary atherosclerosis due to calcified coronary lesion: Secondary | ICD-10-CM | POA: Diagnosis not present

## 2023-01-08 DIAGNOSIS — N390 Urinary tract infection, site not specified: Secondary | ICD-10-CM | POA: Diagnosis not present

## 2023-01-08 DIAGNOSIS — I2584 Coronary atherosclerosis due to calcified coronary lesion: Secondary | ICD-10-CM | POA: Diagnosis not present

## 2023-01-08 DIAGNOSIS — K5900 Constipation, unspecified: Secondary | ICD-10-CM | POA: Diagnosis not present

## 2023-01-08 DIAGNOSIS — Z8673 Personal history of transient ischemic attack (TIA), and cerebral infarction without residual deficits: Secondary | ICD-10-CM | POA: Diagnosis not present

## 2023-01-08 DIAGNOSIS — I2510 Atherosclerotic heart disease of native coronary artery without angina pectoris: Secondary | ICD-10-CM | POA: Diagnosis not present

## 2023-01-08 DIAGNOSIS — Z7984 Long term (current) use of oral hypoglycemic drugs: Secondary | ICD-10-CM | POA: Diagnosis not present

## 2023-01-08 DIAGNOSIS — Z9181 History of falling: Secondary | ICD-10-CM | POA: Diagnosis not present

## 2023-01-08 DIAGNOSIS — I4891 Unspecified atrial fibrillation: Secondary | ICD-10-CM | POA: Diagnosis not present

## 2023-01-08 DIAGNOSIS — Z87891 Personal history of nicotine dependence: Secondary | ICD-10-CM | POA: Diagnosis not present

## 2023-01-08 DIAGNOSIS — F319 Bipolar disorder, unspecified: Secondary | ICD-10-CM | POA: Diagnosis not present

## 2023-01-08 DIAGNOSIS — E119 Type 2 diabetes mellitus without complications: Secondary | ICD-10-CM | POA: Diagnosis not present

## 2023-01-08 DIAGNOSIS — I10 Essential (primary) hypertension: Secondary | ICD-10-CM | POA: Diagnosis not present

## 2023-01-08 DIAGNOSIS — Z7985 Long-term (current) use of injectable non-insulin antidiabetic drugs: Secondary | ICD-10-CM | POA: Diagnosis not present

## 2023-01-08 DIAGNOSIS — I700 Atherosclerosis of aorta: Secondary | ICD-10-CM | POA: Diagnosis not present

## 2023-01-08 DIAGNOSIS — Z794 Long term (current) use of insulin: Secondary | ICD-10-CM | POA: Diagnosis not present

## 2023-01-08 DIAGNOSIS — Z6841 Body Mass Index (BMI) 40.0 and over, adult: Secondary | ICD-10-CM | POA: Diagnosis not present

## 2023-01-08 DIAGNOSIS — Z7901 Long term (current) use of anticoagulants: Secondary | ICD-10-CM | POA: Diagnosis not present

## 2023-01-08 DIAGNOSIS — B9620 Unspecified Escherichia coli [E. coli] as the cause of diseases classified elsewhere: Secondary | ICD-10-CM | POA: Diagnosis not present

## 2023-01-09 DIAGNOSIS — F319 Bipolar disorder, unspecified: Secondary | ICD-10-CM | POA: Diagnosis not present

## 2023-01-09 DIAGNOSIS — Z9181 History of falling: Secondary | ICD-10-CM | POA: Diagnosis not present

## 2023-01-09 DIAGNOSIS — Z7985 Long-term (current) use of injectable non-insulin antidiabetic drugs: Secondary | ICD-10-CM | POA: Diagnosis not present

## 2023-01-09 DIAGNOSIS — I2584 Coronary atherosclerosis due to calcified coronary lesion: Secondary | ICD-10-CM | POA: Diagnosis not present

## 2023-01-09 DIAGNOSIS — N390 Urinary tract infection, site not specified: Secondary | ICD-10-CM | POA: Diagnosis not present

## 2023-01-09 DIAGNOSIS — I2510 Atherosclerotic heart disease of native coronary artery without angina pectoris: Secondary | ICD-10-CM | POA: Diagnosis not present

## 2023-01-09 DIAGNOSIS — Z7984 Long term (current) use of oral hypoglycemic drugs: Secondary | ICD-10-CM | POA: Diagnosis not present

## 2023-01-09 DIAGNOSIS — Z7901 Long term (current) use of anticoagulants: Secondary | ICD-10-CM | POA: Diagnosis not present

## 2023-01-09 DIAGNOSIS — Z87891 Personal history of nicotine dependence: Secondary | ICD-10-CM | POA: Diagnosis not present

## 2023-01-09 DIAGNOSIS — Z8673 Personal history of transient ischemic attack (TIA), and cerebral infarction without residual deficits: Secondary | ICD-10-CM | POA: Diagnosis not present

## 2023-01-09 DIAGNOSIS — I10 Essential (primary) hypertension: Secondary | ICD-10-CM | POA: Diagnosis not present

## 2023-01-09 DIAGNOSIS — Z794 Long term (current) use of insulin: Secondary | ICD-10-CM | POA: Diagnosis not present

## 2023-01-09 DIAGNOSIS — E119 Type 2 diabetes mellitus without complications: Secondary | ICD-10-CM | POA: Diagnosis not present

## 2023-01-09 DIAGNOSIS — Z6841 Body Mass Index (BMI) 40.0 and over, adult: Secondary | ICD-10-CM | POA: Diagnosis not present

## 2023-01-09 DIAGNOSIS — K5900 Constipation, unspecified: Secondary | ICD-10-CM | POA: Diagnosis not present

## 2023-01-09 DIAGNOSIS — I700 Atherosclerosis of aorta: Secondary | ICD-10-CM | POA: Diagnosis not present

## 2023-01-09 DIAGNOSIS — I4891 Unspecified atrial fibrillation: Secondary | ICD-10-CM | POA: Diagnosis not present

## 2023-01-09 DIAGNOSIS — B9620 Unspecified Escherichia coli [E. coli] as the cause of diseases classified elsewhere: Secondary | ICD-10-CM | POA: Diagnosis not present

## 2023-01-10 DIAGNOSIS — I4891 Unspecified atrial fibrillation: Secondary | ICD-10-CM | POA: Diagnosis not present

## 2023-01-10 DIAGNOSIS — I10 Essential (primary) hypertension: Secondary | ICD-10-CM | POA: Diagnosis not present

## 2023-01-10 DIAGNOSIS — I2510 Atherosclerotic heart disease of native coronary artery without angina pectoris: Secondary | ICD-10-CM | POA: Diagnosis not present

## 2023-01-10 DIAGNOSIS — E119 Type 2 diabetes mellitus without complications: Secondary | ICD-10-CM | POA: Diagnosis not present

## 2023-01-10 DIAGNOSIS — I2584 Coronary atherosclerosis due to calcified coronary lesion: Secondary | ICD-10-CM | POA: Diagnosis not present

## 2023-01-10 DIAGNOSIS — B9620 Unspecified Escherichia coli [E. coli] as the cause of diseases classified elsewhere: Secondary | ICD-10-CM | POA: Diagnosis not present

## 2023-01-10 DIAGNOSIS — Z7984 Long term (current) use of oral hypoglycemic drugs: Secondary | ICD-10-CM | POA: Diagnosis not present

## 2023-01-10 DIAGNOSIS — Z794 Long term (current) use of insulin: Secondary | ICD-10-CM | POA: Diagnosis not present

## 2023-01-10 DIAGNOSIS — Z8673 Personal history of transient ischemic attack (TIA), and cerebral infarction without residual deficits: Secondary | ICD-10-CM | POA: Diagnosis not present

## 2023-01-10 DIAGNOSIS — Z6841 Body Mass Index (BMI) 40.0 and over, adult: Secondary | ICD-10-CM | POA: Diagnosis not present

## 2023-01-10 DIAGNOSIS — Z87891 Personal history of nicotine dependence: Secondary | ICD-10-CM | POA: Diagnosis not present

## 2023-01-10 DIAGNOSIS — Z7985 Long-term (current) use of injectable non-insulin antidiabetic drugs: Secondary | ICD-10-CM | POA: Diagnosis not present

## 2023-01-10 DIAGNOSIS — I700 Atherosclerosis of aorta: Secondary | ICD-10-CM | POA: Diagnosis not present

## 2023-01-10 DIAGNOSIS — F319 Bipolar disorder, unspecified: Secondary | ICD-10-CM | POA: Diagnosis not present

## 2023-01-10 DIAGNOSIS — N390 Urinary tract infection, site not specified: Secondary | ICD-10-CM | POA: Diagnosis not present

## 2023-01-10 DIAGNOSIS — K5900 Constipation, unspecified: Secondary | ICD-10-CM | POA: Diagnosis not present

## 2023-01-10 DIAGNOSIS — Z7901 Long term (current) use of anticoagulants: Secondary | ICD-10-CM | POA: Diagnosis not present

## 2023-01-10 DIAGNOSIS — Z9181 History of falling: Secondary | ICD-10-CM | POA: Diagnosis not present

## 2023-01-11 DIAGNOSIS — F319 Bipolar disorder, unspecified: Secondary | ICD-10-CM | POA: Diagnosis not present

## 2023-01-11 DIAGNOSIS — I2584 Coronary atherosclerosis due to calcified coronary lesion: Secondary | ICD-10-CM | POA: Diagnosis not present

## 2023-01-11 DIAGNOSIS — I700 Atherosclerosis of aorta: Secondary | ICD-10-CM | POA: Diagnosis not present

## 2023-01-11 DIAGNOSIS — Z794 Long term (current) use of insulin: Secondary | ICD-10-CM | POA: Diagnosis not present

## 2023-01-11 DIAGNOSIS — Z87891 Personal history of nicotine dependence: Secondary | ICD-10-CM | POA: Diagnosis not present

## 2023-01-11 DIAGNOSIS — N390 Urinary tract infection, site not specified: Secondary | ICD-10-CM | POA: Diagnosis not present

## 2023-01-11 DIAGNOSIS — Z8673 Personal history of transient ischemic attack (TIA), and cerebral infarction without residual deficits: Secondary | ICD-10-CM | POA: Diagnosis not present

## 2023-01-11 DIAGNOSIS — B9620 Unspecified Escherichia coli [E. coli] as the cause of diseases classified elsewhere: Secondary | ICD-10-CM | POA: Diagnosis not present

## 2023-01-11 DIAGNOSIS — I10 Essential (primary) hypertension: Secondary | ICD-10-CM | POA: Diagnosis not present

## 2023-01-11 DIAGNOSIS — Z7901 Long term (current) use of anticoagulants: Secondary | ICD-10-CM | POA: Diagnosis not present

## 2023-01-11 DIAGNOSIS — I2510 Atherosclerotic heart disease of native coronary artery without angina pectoris: Secondary | ICD-10-CM | POA: Diagnosis not present

## 2023-01-11 DIAGNOSIS — Z9181 History of falling: Secondary | ICD-10-CM | POA: Diagnosis not present

## 2023-01-11 DIAGNOSIS — K5900 Constipation, unspecified: Secondary | ICD-10-CM | POA: Diagnosis not present

## 2023-01-11 DIAGNOSIS — Z7984 Long term (current) use of oral hypoglycemic drugs: Secondary | ICD-10-CM | POA: Diagnosis not present

## 2023-01-11 DIAGNOSIS — Z6841 Body Mass Index (BMI) 40.0 and over, adult: Secondary | ICD-10-CM | POA: Diagnosis not present

## 2023-01-11 DIAGNOSIS — E119 Type 2 diabetes mellitus without complications: Secondary | ICD-10-CM | POA: Diagnosis not present

## 2023-01-11 DIAGNOSIS — Z7985 Long-term (current) use of injectable non-insulin antidiabetic drugs: Secondary | ICD-10-CM | POA: Diagnosis not present

## 2023-01-11 DIAGNOSIS — I4891 Unspecified atrial fibrillation: Secondary | ICD-10-CM | POA: Diagnosis not present

## 2023-01-18 ENCOUNTER — Inpatient Hospital Stay: Payer: Medicare HMO | Admitting: Neurology

## 2023-01-18 DIAGNOSIS — I10 Essential (primary) hypertension: Secondary | ICD-10-CM | POA: Diagnosis not present

## 2023-01-18 DIAGNOSIS — Z8673 Personal history of transient ischemic attack (TIA), and cerebral infarction without residual deficits: Secondary | ICD-10-CM | POA: Diagnosis not present

## 2023-01-18 DIAGNOSIS — Z7984 Long term (current) use of oral hypoglycemic drugs: Secondary | ICD-10-CM | POA: Diagnosis not present

## 2023-01-18 DIAGNOSIS — Z87891 Personal history of nicotine dependence: Secondary | ICD-10-CM | POA: Diagnosis not present

## 2023-01-18 DIAGNOSIS — B9620 Unspecified Escherichia coli [E. coli] as the cause of diseases classified elsewhere: Secondary | ICD-10-CM | POA: Diagnosis not present

## 2023-01-18 DIAGNOSIS — I2584 Coronary atherosclerosis due to calcified coronary lesion: Secondary | ICD-10-CM | POA: Diagnosis not present

## 2023-01-18 DIAGNOSIS — Z794 Long term (current) use of insulin: Secondary | ICD-10-CM | POA: Diagnosis not present

## 2023-01-18 DIAGNOSIS — K5900 Constipation, unspecified: Secondary | ICD-10-CM | POA: Diagnosis not present

## 2023-01-18 DIAGNOSIS — Z6841 Body Mass Index (BMI) 40.0 and over, adult: Secondary | ICD-10-CM | POA: Diagnosis not present

## 2023-01-18 DIAGNOSIS — Z7901 Long term (current) use of anticoagulants: Secondary | ICD-10-CM | POA: Diagnosis not present

## 2023-01-18 DIAGNOSIS — E119 Type 2 diabetes mellitus without complications: Secondary | ICD-10-CM | POA: Diagnosis not present

## 2023-01-18 DIAGNOSIS — N390 Urinary tract infection, site not specified: Secondary | ICD-10-CM | POA: Diagnosis not present

## 2023-01-18 DIAGNOSIS — I700 Atherosclerosis of aorta: Secondary | ICD-10-CM | POA: Diagnosis not present

## 2023-01-18 DIAGNOSIS — I4891 Unspecified atrial fibrillation: Secondary | ICD-10-CM | POA: Diagnosis not present

## 2023-01-18 DIAGNOSIS — F319 Bipolar disorder, unspecified: Secondary | ICD-10-CM | POA: Diagnosis not present

## 2023-01-18 DIAGNOSIS — I2510 Atherosclerotic heart disease of native coronary artery without angina pectoris: Secondary | ICD-10-CM | POA: Diagnosis not present

## 2023-01-18 DIAGNOSIS — Z7985 Long-term (current) use of injectable non-insulin antidiabetic drugs: Secondary | ICD-10-CM | POA: Diagnosis not present

## 2023-01-18 DIAGNOSIS — Z9181 History of falling: Secondary | ICD-10-CM | POA: Diagnosis not present

## 2023-01-19 ENCOUNTER — Ambulatory Visit: Payer: Medicare HMO | Admitting: Internal Medicine

## 2023-01-19 DIAGNOSIS — N3000 Acute cystitis without hematuria: Secondary | ICD-10-CM | POA: Diagnosis not present

## 2023-01-27 ENCOUNTER — Telehealth: Payer: Self-pay | Admitting: Internal Medicine

## 2023-01-27 DIAGNOSIS — Z794 Long term (current) use of insulin: Secondary | ICD-10-CM | POA: Diagnosis not present

## 2023-01-27 DIAGNOSIS — Z8673 Personal history of transient ischemic attack (TIA), and cerebral infarction without residual deficits: Secondary | ICD-10-CM | POA: Diagnosis not present

## 2023-01-27 DIAGNOSIS — F319 Bipolar disorder, unspecified: Secondary | ICD-10-CM | POA: Diagnosis not present

## 2023-01-27 DIAGNOSIS — I10 Essential (primary) hypertension: Secondary | ICD-10-CM | POA: Diagnosis not present

## 2023-01-27 DIAGNOSIS — Z7901 Long term (current) use of anticoagulants: Secondary | ICD-10-CM | POA: Diagnosis not present

## 2023-01-27 DIAGNOSIS — I700 Atherosclerosis of aorta: Secondary | ICD-10-CM | POA: Diagnosis not present

## 2023-01-27 DIAGNOSIS — Z7984 Long term (current) use of oral hypoglycemic drugs: Secondary | ICD-10-CM | POA: Diagnosis not present

## 2023-01-27 DIAGNOSIS — Z9181 History of falling: Secondary | ICD-10-CM | POA: Diagnosis not present

## 2023-01-27 DIAGNOSIS — I2584 Coronary atherosclerosis due to calcified coronary lesion: Secondary | ICD-10-CM | POA: Diagnosis not present

## 2023-01-27 DIAGNOSIS — B9620 Unspecified Escherichia coli [E. coli] as the cause of diseases classified elsewhere: Secondary | ICD-10-CM | POA: Diagnosis not present

## 2023-01-27 DIAGNOSIS — Z87891 Personal history of nicotine dependence: Secondary | ICD-10-CM | POA: Diagnosis not present

## 2023-01-27 DIAGNOSIS — I4891 Unspecified atrial fibrillation: Secondary | ICD-10-CM | POA: Diagnosis not present

## 2023-01-27 DIAGNOSIS — I2510 Atherosclerotic heart disease of native coronary artery without angina pectoris: Secondary | ICD-10-CM | POA: Diagnosis not present

## 2023-01-27 DIAGNOSIS — E119 Type 2 diabetes mellitus without complications: Secondary | ICD-10-CM | POA: Diagnosis not present

## 2023-01-27 DIAGNOSIS — N390 Urinary tract infection, site not specified: Secondary | ICD-10-CM | POA: Diagnosis not present

## 2023-01-27 DIAGNOSIS — K5900 Constipation, unspecified: Secondary | ICD-10-CM | POA: Diagnosis not present

## 2023-01-27 DIAGNOSIS — Z7985 Long-term (current) use of injectable non-insulin antidiabetic drugs: Secondary | ICD-10-CM | POA: Diagnosis not present

## 2023-01-27 DIAGNOSIS — Z6841 Body Mass Index (BMI) 40.0 and over, adult: Secondary | ICD-10-CM | POA: Diagnosis not present

## 2023-01-27 NOTE — Telephone Encounter (Signed)
*  STAT* If patient is at the pharmacy, call can be transferred to refill team.   1. Which medications need to be refilled? (please list name of each medication and dose if known) metoprolol tartrate (LOPRESSOR) 25 MG tablet ; diltiazem (CARDIZEM CD) 120 MG 24 hr capsule    2. Would you like to learn more about the convenience, safety, & potential cost savings by using the [facility]?    3. Are you open to using the [facility] (Type [facility]. ).   4. Which pharmacy/location (including street and city if local pharmacy) is medication to be sent to? [facility] - [place], [place] - [address]    5. Do they need a 30 day or 90 day supply? 30

## 2023-01-28 MED ORDER — DILTIAZEM HCL ER COATED BEADS 120 MG PO CP24: 120.0000 mg | ORAL_CAPSULE | Freq: Every day | ORAL | 1 refills | Status: DC

## 2023-01-28 MED ORDER — METOPROLOL TARTRATE 25 MG PO TABS: 25.0000 mg | ORAL_TABLET | Freq: Two times a day (BID) | ORAL | 1 refills | Status: DC

## 2023-01-28 NOTE — Telephone Encounter (Signed)
Refilled

## 2023-02-01 DIAGNOSIS — N3090 Cystitis, unspecified without hematuria: Secondary | ICD-10-CM | POA: Diagnosis not present

## 2023-03-11 ENCOUNTER — Telehealth: Payer: Self-pay | Admitting: Neurology

## 2023-03-11 NOTE — Telephone Encounter (Signed)
Appointment needed to be r/s

## 2023-03-22 DIAGNOSIS — N3090 Cystitis, unspecified without hematuria: Secondary | ICD-10-CM | POA: Diagnosis not present

## 2023-03-24 ENCOUNTER — Ambulatory Visit: Payer: Medicare HMO | Admitting: Internal Medicine

## 2023-03-29 ENCOUNTER — Telehealth: Payer: Self-pay | Admitting: Internal Medicine

## 2023-03-29 MED ORDER — METOPROLOL TARTRATE 25 MG PO TABS: 25.0000 mg | ORAL_TABLET | Freq: Two times a day (BID) | ORAL | 1 refills | Status: DC

## 2023-03-29 MED ORDER — DILTIAZEM HCL ER COATED BEADS 120 MG PO CP24: 120.0000 mg | ORAL_CAPSULE | Freq: Every day | ORAL | 1 refills | Status: DC

## 2023-03-29 NOTE — Telephone Encounter (Signed)
*  STAT* If patient is at the pharmacy, call can be transferred to refill team.   1. Which medications need to be refilled? (please list name of each medication and dose if known) diltiazem  (CARDIZEM  CD) 120 MG 24 hr capsule   metoprolol  tartrate (LOPRESSOR ) 25 MG tablet    2. Would you like to learn more about the convenience, safety, & potential cost savings by using the [facility]? No   3. Are you open to using the [facility] ([facility].) No   4. Which pharmacy/location (including street and city if local pharmacy) is medication to be sent to?  [facility] - [place], [place] - [address]     5. Do they need a 30 day or 90 day supply? 30 day

## 2023-03-29 NOTE — Telephone Encounter (Signed)
 Filled

## 2023-03-31 ENCOUNTER — Ambulatory Visit: Payer: Medicare HMO | Admitting: Internal Medicine

## 2023-04-11 ENCOUNTER — Inpatient Hospital Stay: Payer: Medicare HMO | Admitting: Neurology

## 2023-04-17 ENCOUNTER — Emergency Department (HOSPITAL_COMMUNITY): Payer: HMO

## 2023-04-17 ENCOUNTER — Emergency Department (HOSPITAL_COMMUNITY)
Admission: EM | Admit: 2023-04-17 | Discharge: 2023-04-17 | Disposition: A | Discharge status: Home / Self Care | Payer: HMO | Attending: Emergency Medicine | Admitting: Emergency Medicine

## 2023-04-17 ENCOUNTER — Other Ambulatory Visit: Payer: Self-pay

## 2023-04-17 ENCOUNTER — Encounter (HOSPITAL_COMMUNITY): Payer: Self-pay

## 2023-04-17 DIAGNOSIS — Z794 Long term (current) use of insulin: Secondary | ICD-10-CM | POA: Diagnosis not present

## 2023-04-17 DIAGNOSIS — E119 Type 2 diabetes mellitus without complications: Secondary | ICD-10-CM | POA: Diagnosis not present

## 2023-04-17 DIAGNOSIS — W260XXA Contact with knife, initial encounter: Secondary | ICD-10-CM | POA: Insufficient documentation

## 2023-04-17 DIAGNOSIS — S61215A Laceration without foreign body of left ring finger without damage to nail, initial encounter: Secondary | ICD-10-CM | POA: Insufficient documentation

## 2023-04-17 DIAGNOSIS — Z91040 Latex allergy status: Secondary | ICD-10-CM | POA: Diagnosis not present

## 2023-04-17 DIAGNOSIS — Z79899 Other long term (current) drug therapy: Secondary | ICD-10-CM | POA: Insufficient documentation

## 2023-04-17 DIAGNOSIS — Z7984 Long term (current) use of oral hypoglycemic drugs: Secondary | ICD-10-CM | POA: Diagnosis not present

## 2023-04-17 DIAGNOSIS — Z7901 Long term (current) use of anticoagulants: Secondary | ICD-10-CM | POA: Insufficient documentation

## 2023-04-17 DIAGNOSIS — I10 Essential (primary) hypertension: Secondary | ICD-10-CM | POA: Insufficient documentation

## 2023-04-17 MED ORDER — POVIDONE-IODINE 10 % EX SOLN
CUTANEOUS | Status: DC | PRN
  Filled 2023-04-17: qty 14.8

## 2023-04-17 MED ORDER — LIDOCAINE HCL (PF) 1 % IJ SOLN
5.0000 mL | Freq: Once | INTRAMUSCULAR | Status: AC
  Administered 2023-04-17: 5 mL via INTRADERMAL
  Filled 2023-04-17: qty 5

## 2023-04-17 NOTE — ED Provider Notes (Signed)
 [name] Provider Note   CSN: [id number] Arrival date & time: 04/17/23  4696     History  Chief Complaint  Patient presents with   Finger Injury    [name] is a 73 y.o. female.  HPI     [name] is a 73 y.o. female with past medical history of hypertension, type 2 diabetes, paroxysmal atrial fibrillation anticoagulated.  She presents to the [name] complaining of laceration to her left ring finger that occurred around noon today.  States that she was using a knife to open the plastic wrap on a bottle when the knife slipped causing a laceration near the lateral aspect of the distal finger.  No nail involvement.  She describes persistent bleeding of the area despite direct pressure.  She has some numbness tingling sensation to the distal finger.  No pain with movement no pain to the proximal finger.  Home Medications Prior to Admission medications   Medication Sig Start Date End Date Taking? Authorizing Provider  acetaminophen (TYLENOL) 650 MG CR tablet Take 1,300 mg by mouth every 8 (eight) hours as needed for pain. BID    [provider]  albuterol (PROVENTIL) (2.5 MG/3ML) 0.083% nebulizer solution Take 3 mLs (2.5 mg total) by nebulization every 4 (four) hours as needed for wheezing or shortness of breath. Patient not taking: Reported on 12/15/2022 08/10/22 08/10/23  Shon Hale, MD  albuterol (VENTOLIN HFA) 108 (90 Base) MCG/ACT inhaler Inhale 2 puffs into the lungs every 8 (eight) hours as needed for shortness of breath or wheezing. 08/10/22   Shon Hale, MD  apixaban (ELIQUIS) 5 MG TABS tablet Take 1 tablet (5 mg total) by mouth 2 (two) times daily. 11/29/22   Sherryll Burger, Pratik D, DO  atorvastatin (LIPITOR) 40 MG tablet Take 1 tablet (40 mg total) by mouth daily. 08/10/22 08/10/23  Shon Hale, MD  BYDUREON 2 MG PEN Inject 2 mg as directed. Every Sunday. 10/18/17   [provider]   cyclobenzaprine (FLEXERIL) 10 MG tablet Take 1 tablet (10 mg total) by mouth 2 (two) times daily as needed for muscle spasms. 11/08/20   Kommor, [place], MD  dicyclomine (BENTYL) 10 MG capsule Take 1 capsule (10 mg total) by mouth 3 (three) times daily as needed (abdominal cramping or diarrhea. Hold in setting of constipation.). 09/21/22   Gelene Mink, NP  diltiazem (CARDIZEM CD) 120 MG 24 hr capsule Take 1 capsule (120 mg total) by mouth daily. 03/29/23   Mallipeddi, Vishnu P, MD  famotidine (PEPCID) 20 MG tablet One after supper Patient taking differently: Take 20 mg by mouth daily. 04/26/22   Nyoka Cowden, MD  fluticasone (FLONASE) 50 MCG/ACT nasal spray Place 2 sprays into both nostrils daily.    [provider]  gabapentin (NEURONTIN) 300 MG capsule Take 300 mg by mouth 3 (three) times daily. 07/05/22   [provider]  Insulin Glargine (BASAGLAR KWIKPEN) 100 UNIT/ML Inject 18 Units into the skin at bedtime. 06/24/22   [provider]  Insulin Pen Needle 31G X 5 MM MISC by Does not apply route.    [provider]  linaclotide (LINZESS) 290 MCG CAPS capsule TAKE 1 CAPSULE BY MOUTH ONCE DAILY BEFORE BREAKFAST Patient taking differently: Take 290 mcg by mouth daily before breakfast. 04/15/22   Gelene Mink, NP  meclizine (ANTIVERT) 25 MG tablet Take 25 mg by mouth 2 (two) times daily as needed for dizziness. 03/16/18  [provider]  metFORMIN (GLUCOPHAGE) 500 MG tablet Take 500 mg by mouth 2 (two) times daily with a meal.    [provider]  metoprolol tartrate (LOPRESSOR) 25 MG tablet Take 1 tablet (25 mg total) by mouth 2 (two) times daily. 03/29/23   Mallipeddi, Vishnu P, MD  montelukast (SINGULAIR) 10 MG tablet Take 10 mg by mouth at bedtime.    [provider]  nitroGLYCERIN (NITROSTAT) 0.4 MG SL tablet Place 1 tablet (0.4 mg total) under the tongue every 5 (five) minutes x 3 doses as needed for chest pain. 04/27/18   Strader, Lennart Pall, PA-C  OVER THE COUNTER MEDICATION One touch Verio Reflect glucose machine.  One touch Verio Reflect glucose test strips 1-2 daily  One touch delica plus Lancets one lancet to check glucose bid.    [provider]  pantoprazole (PROTONIX) 40 MG tablet Take 40 mg by mouth daily. 11/28/17   [provider]  sodium chloride (OCEAN) 0.65 % SOLN nasal spray Place 1 spray into both nostrils as needed for congestion.    [provider]  VASCEPA 1 g capsule Take 2 g by mouth 2 (two) times daily. 06/17/20   [provider]      Allergies    Ciprofloxacin, Latex, and Sulfa antibiotics    Review of Systems   Review of Systems  Constitutional:  Negative for appetite change, chills and fever.  Cardiovascular:  Negative for chest pain.  Gastrointestinal:  Negative for nausea and vomiting.  Skin:  Positive for wound. Negative for color change.       Laceration distal left ring finger  Neurological:  Positive for numbness (Numbness and tingling distal finger). Negative for weakness.    Physical Exam Updated Vital Signs BP (!) 172/78 (BP Location: Right Arm)   Pulse 79   Temp 98.9 F (37.2 C) (Oral)   Resp 18   SpO2 96%  Physical Exam Vitals and nursing note reviewed.  Constitutional:      General: She is not in acute distress.    Appearance: Normal appearance. She is toxic-appearing. She is not ill-appearing.  Cardiovascular:     Rate and Rhythm: Normal rate and regular rhythm.     Pulses: Normal pulses.  Pulmonary:     Effort: Pulmonary effort is normal.  Musculoskeletal:        General: Tenderness and signs of injury present. No swelling or deformity. Normal range of motion.     Left hand: Laceration present. No swelling, deformity or bony tenderness. Normal range of motion. Normal strength. Normal sensation. Normal capillary refill. Normal pulse.     Comments: 2 cm superficial appearing laceration distal left ring finger.  Mild bleeding noted.  No  nail involvement.  No tenderness at the PIP or DIP joints of the finger.  Skin:    General: Skin is warm.  Neurological:     Mental Status: She is alert.     ED Results / Procedures / Treatments   Labs (all labs ordered are listed, but only abnormal results are displayed) Labs Reviewed - No data to display  EKG None  Radiology DG Finger Ring Left Result Date: 04/17/2023 CLINICAL DATA:  Fourth left finger laceration. EXAM: LEFT RING FINGER 2+V COMPARISON:  None Available. FINDINGS: There is no evidence of fracture or dislocation. Degenerative changes seen involving multiple interphalangeal joints. Soft tissues are unremarkable. IMPRESSION: Degenerative changes without evidence of acute fracture or dislocation. Electronically Signed   By: Aram Candela  M.D.   On: 04/17/2023 17:39    Procedures Procedures     LACERATION REPAIR Performed by: Symantha Steeber Authorized by: Angelis Gates Consent: Verbal consent obtained. Risks and benefits: risks, benefits and alternatives were discussed Consent given by: patient Patient identity confirmed: provided demographic data Prepped and Draped in normal sterile fashion Wound explored  Laceration Location: left ring finger  Laceration Length: 2 cm  No Foreign Bodies seen or palpated  Anesthesia: none   Irrigation method: syringe Amount of cleaning: standard  Skin closure: tissue adhesive    Technique: topical application in several layers  Patient tolerance: Patient tolerated the procedure well with no immediate complications.  Medications Ordered in ED Medications  lidocaine (PF) (XYLOCAINE) 1 % injection 5 mL (has no administration in time range)  povidone-iodine (BETADINE) 10 % external solution (has no administration in time range)    ED Course/ Medical Decision Making/ A&P                                 Medical Decision Making Patient here for laceration distal left ring finger.  She has attempted control  of bleeding at home using direct pressure without success.  She is anticoagulated due to history of paroxysmal atrial fibrillation. Some paresthesias of the finger, but she has good range of motion and good cap refill.  There is no involvement of the nail.  Patient reports Td is up-to-date  Pressure dressing applied here upon my initial evaluation.  On recheck, bleeding has now resolved prior to wound closure.  No paraesthesias of the finger on recheck and cap refill is good.   Discussed closure and through shared decision making, patient prefers to try tissue adhesive over sutures     Amount and/or Complexity of Data Reviewed Radiology: ordered.    Details: X-ray of the finger with degenerative changes no evidence of fracture or dislocation Discussion of management or test interpretation with external provider(s):   Pt observed to ensure hemostasis after application of tissue adhesive and bleeding has resolved.  She maintains full ROM of the finger.  NV intact.  One strip strip also applied as well and bandage.  Pt agrees to wound care instructions and out pt f/u if needed.     Risk OTC drugs. Prescription drug management.           Final Clinical Impression(s) / ED Diagnoses Final diagnoses:  Laceration of left ring finger without foreign body without damage to nail, initial encounter    Rx / DC Orders ED Discharge Orders     None         Rosey Bath 04/18/23 1536    Bethann Berkshire, MD 04/24/23 1730

## 2023-04-17 NOTE — ED Triage Notes (Signed)
 Pt states she cut her left ring finger with a knife trying to open something, bleeding controlled in triage.

## 2023-04-17 NOTE — Discharge Instructions (Signed)
 Keep the finger clean and dry.  The Steri-Strip and Dermabond will begin to peel off on its own in a week or so.  Elevate your hand this evening when possible.  You may take Tylenol if needed for discomfort.  Return to the emergency department for any worsening symptoms or signs of infection.

## 2023-04-26 ENCOUNTER — Inpatient Hospital Stay: Payer: Medicare HMO | Admitting: Neurology

## 2023-04-29 ENCOUNTER — Other Ambulatory Visit: Payer: Self-pay | Admitting: Gastroenterology

## 2023-04-29 DIAGNOSIS — K581 Irritable bowel syndrome with constipation: Secondary | ICD-10-CM

## 2023-05-02 ENCOUNTER — Telehealth: Payer: Self-pay

## 2023-05-02 NOTE — Telephone Encounter (Signed)
 Returned the pt's call and advised her she would need an appt before further refills. Pt's call sent to the front desk

## 2023-05-03 ENCOUNTER — Ambulatory Visit: Payer: Medicare HMO | Admitting: Urology

## 2023-05-12 ENCOUNTER — Ambulatory Visit: Payer: Medicare HMO | Attending: Internal Medicine | Admitting: Internal Medicine

## 2023-05-12 ENCOUNTER — Encounter: Payer: Self-pay | Admitting: Internal Medicine

## 2023-05-12 ENCOUNTER — Telehealth: Payer: Self-pay | Admitting: Internal Medicine

## 2023-05-12 ENCOUNTER — Ambulatory Visit

## 2023-05-12 VITALS — BP 134/72 | HR 69 | Ht 63.0 in | Wt 242.8 lb

## 2023-05-12 DIAGNOSIS — R42 Dizziness and giddiness: Secondary | ICD-10-CM | POA: Diagnosis not present

## 2023-05-12 NOTE — Patient Instructions (Addendum)
 Medication Instructions:  Your physician recommends that you continue on your current medications as directed. Please refer to the Current Medication list given to you today.  *If you need a refill on your cardiac medications before your next appointment, please call your pharmacy*   Lab Work: None If you have labs (blood work) drawn today and your tests are completely normal, you will receive your results only by: MyChart Message (if you have MyChart) OR A paper copy in the mail If you have any lab test that is abnormal or we need to change your treatment, we will call you to review the results.   Testing/Procedures: ZIO   Follow-Up: At [facility], you and your health needs are our priority.  As part of our continuing mission to provide you with exceptional heart care, we have created designated Provider Care Teams.  These Care Teams include your primary Cardiologist (physician) and Advanced Practice Providers (APPs -  Physician Assistants and Nurse Practitioners) who all work together to provide you with the care you need, when you need it.  We recommend signing up for the patient portal called "MyChart".  Sign up information is provided on this After Visit Summary.  MyChart is used to connect with patients for Virtual Visits (Telemedicine).  Patients are able to view lab/test results, encounter notes, upcoming appointments, etc.  Non-urgent messages can be sent to your provider as well.   To learn more about what you can do with MyChart, go to [email or web address].    Your next appointment:   1 year(s)  Provider:   You may see Luane School, MD or the following Advanced Practice Provider on your designated Care Team:   Sharlene Dory, NP    Other Instructions ZIO XT- Long Term Monitor Instructions   Your physician has requested you wear your ZIO patch monitor___14____days.   This is a single patch monitor.  [employer] supplies one patch monitor per enrollment.   Additional stickers are not available.   Please do not apply patch if you will be having a Nuclear Stress Test, Echocardiogram, Cardiac CT, MRI, or Chest Xray during the time frame you would be wearing the monitor. The patch cannot be worn during these tests.  You cannot remove and re-apply the ZIO XT patch monitor.   Your ZIO patch monitor will be sent USPS Priority mail from [facility] directly to your home address. The monitor may also be mailed to a PO BOX if home delivery is not available.   It may take 3-5 days to receive your monitor after you have been enrolled.   Once you have received you monitor, please review enclosed instructions.  Your monitor has already been registered assigning a specific monitor serial # to you.   Applying the monitor   Shave hair from upper left chest.   Hold abrader disc by orange tab.  Rub abrader in 40 strokes over left upper chest as indicated in your monitor instructions.   Clean area with 4 enclosed alcohol pads .  Use all pads to assure are is cleaned thoroughly.  Let dry.   Apply patch as indicated in monitor instructions.  Patch will be place under collarbone on left side of chest with arrow pointing upward.   Rub patch adhesive wings for 2 minutes.Remove white label marked "1".  Remove white label marked "2".  Rub patch adhesive wings for 2 additional minutes.   While looking in a mirror, press and release button in center of patch.  A  small green light will flash 3-4 times .  This will be your only indicator the monitor has been turned on.     Do not shower for the first 24 hours.  You may shower after the first 24 hours.   Press button if you feel a symptom. You will hear a small click.  Record Date, Time and Symptom in the Patient Log Book.   When you are ready to remove patch, follow instructions on last 2 pages of Patient Log Book.  Stick patch monitor onto last page of Patient Log Book.   Place Patient Log Book in [place] box.   Use locking tab on box and tape box closed securely.  The [employer] and [employer] has [employer] on it.  Please place in mailbox as soon as possible.  Your physician should have your test results approximately 7 days after the monitor has been mailed back to [facility]   Call [facility] Customer Care at [phone] if you have questions regarding your ZIO XT patch monitor.  Call them immediately if you see an orange light blinking on your monitor.   If your monitor falls off in less than 4 days contact our Monitor department at [phone].  If your monitor becomes loose or falls off after 4 days call [employer] at [phone] for suggestions on securing your monitor.

## 2023-05-12 NOTE — Progress Notes (Signed)
 Cardiology Office Note  Date: 05/12/2023   ID: Jemila, Camille 1951/02/21, MRN [id number]  PCP:  [facility]  Cardiologist:  Marjo Bicker, MD Electrophysiologist:  None   History of Present Illness: [name] is a 73 y.o. female known to have paroxysmal A-fib, HTN, DM 2, HLD, history of CVA is here for follow-up visit.  Overall doing great.  She did complain of dizziness in the last 2 weeks.  Occurs with rest and exertion, not necessarily with positional changes.  Otherwise, no other symptoms of DOE, angina, syncope, palpitations, leg swelling are reported.   Past Medical History:  Diagnosis Date   Bipolar 1 disorder (HCC)    Cancer (HCC)    Diabetes mellitus without complication (HCC)    Hypertension    PONV (postoperative nausea and vomiting)    Stroke [facility])     Past Surgical History:  Procedure Laterality Date   ABDOMINAL HYSTERECTOMY     CHOLECYSTECTOMY     COLONOSCOPY WITH PROPOFOL N/A 10/06/2020   Surgeon: Lanelle Bal, DO; nonbleeding internal hemorrhoids, 5 mm tubular adenoma removed, 3 mm hyperplastic polyp removed, otherwise normal exam.  Recommended 5-year surveillance.   ORIF FEMUR FRACTURE Left 11/30/2016   Procedure: OPEN REDUCTION INTERNAL FIXATION (ORIF) DISTAL FEMUR FRACTURE;  Surgeon: Samson Frederic, MD;  Location: [facility] OR;  Service: Orthopedics;  Laterality: Left;   POLYPECTOMY  10/06/2020   Procedure: POLYPECTOMY INTESTINAL;  Surgeon: Lanelle Bal, DO;  [facility]: [facility];  Service: Endoscopy;;   stroke     TONSILLECTOMY      Current Outpatient Medications  Medication Sig Dispense Refill   acetaminophen (TYLENOL) 650 MG CR tablet Take 1,300 mg by mouth every 8 (eight) hours as needed for pain. BID     albuterol (VENTOLIN HFA) 108 (90 Base) MCG/ACT inhaler Inhale 2 puffs into the lungs every 8 (eight) hours as needed for shortness of breath or wheezing. 18 g 3   apixaban (ELIQUIS) 5 MG TABS tablet Take 1  tablet (5 mg total) by mouth 2 (two) times daily. 60 tablet 2   atorvastatin (LIPITOR) 40 MG tablet Take 1 tablet (40 mg total) by mouth daily. 30 tablet 11   BYDUREON 2 MG PEN Inject 2 mg as directed. Every Sunday.  0   dicyclomine (BENTYL) 10 MG capsule Take 1 capsule (10 mg total) by mouth 3 (three) times daily as needed (abdominal cramping or diarrhea. Hold in setting of constipation.). 120 capsule 1   diltiazem (CARDIZEM CD) 120 MG 24 hr capsule Take 1 capsule (120 mg total) by mouth daily. 30 capsule 1   famotidine (PEPCID) 20 MG tablet One after supper (Patient taking differently: Take 20 mg by mouth daily.) 30 tablet 3   gabapentin (NEURONTIN) 300 MG capsule Take 300 mg by mouth 3 (three) times daily.     Insulin Glargine (BASAGLAR KWIKPEN) 100 UNIT/ML Inject 18 Units into the skin at bedtime.     Insulin Pen Needle 31G X 5 MM MISC by Does not apply route.     LINZESS 290 MCG CAPS capsule TAKE 1 CAPSULE BY MOUTH ONCE DAILY BEFORE BREAKFAST 90 capsule 0   meclizine (ANTIVERT) 25 MG tablet Take 25 mg by mouth 2 (two) times daily as needed for dizziness.     metFORMIN (GLUCOPHAGE) 500 MG tablet Take 500 mg by mouth 2 (two) times daily with a meal.     metoprolol tartrate (LOPRESSOR) 25 MG tablet Take 1 tablet (25  mg total) by mouth 2 (two) times daily. 60 tablet 1   montelukast (SINGULAIR) 10 MG tablet Take 10 mg by mouth at bedtime.     nitrofurantoin (MACRODANTIN) 50 MG capsule Take 50 mg by mouth daily.     nitroGLYCERIN (NITROSTAT) 0.4 MG SL tablet Place 1 tablet (0.4 mg total) under the tongue every 5 (five) minutes x 3 doses as needed for chest pain. 25 tablet 2   OVER THE COUNTER MEDICATION One touch Verio Reflect glucose machine.  One touch Verio Reflect glucose test strips 1-2 daily  One touch delica plus Lancets one lancet to check glucose bid.     pantoprazole (PROTONIX) 40 MG tablet Take 40 mg by mouth daily.  3   promethazine (PHENERGAN) 25 MG tablet Take 1 tablet by mouth  every 12 (twelve) hours as needed.     sodium chloride (OCEAN) 0.65 % SOLN nasal spray Place 1 spray into both nostrils as needed for congestion.     tizanidine (ZANAFLEX) 2 MG capsule SMARTSIG:1 Capsule(s) By Mouth Every 12 Hours PRN     TRESIBA FLEXTOUCH 100 UNIT/ML FlexTouch Pen SMARTSIG:15 Unit(s) SUB-Q Every Night     VASCEPA 1 g capsule Take 2 g by mouth 2 (two) times daily.     cyclobenzaprine (FLEXERIL) 10 MG tablet Take 1 tablet (10 mg total) by mouth 2 (two) times daily as needed for muscle spasms. (Patient not taking: Reported on 05/12/2023) 20 tablet 0   No current facility-administered medications for this visit.   Allergies:  Ciprofloxacin, Latex, and Sulfa antibiotics   Social History: The patient  reports that she quit smoking about 22 years ago. Her smoking use included cigarettes. She started smoking about 42 years ago. She has a 60 pack-year smoking history. She has never used smokeless tobacco. She reports that she does not drink alcohol and does not use drugs.   Family History: The patient's family history includes CAD in her brother and father.   ROS:  Please see the history of present illness. Otherwise, complete review of systems is positive for none.  All other systems are reviewed and negative.   Physical Exam: VS:  BP 134/72   Pulse 69   Ht 5\' 3"  (1.6 m)   Wt 242 lb 12.8 oz (110.1 kg)   SpO2 96%   BMI 43.01 kg/m , BMI Body mass index is 43.01 kg/m.  Wt Readings from Last 3 Encounters:  05/12/23 242 lb 12.8 oz (110.1 kg)  12/15/22 245 lb (111.1 kg)  11/26/22 247 lb 9.2 oz (112.3 kg)    General: Patient appears comfortable at rest. HEENT: Conjunctiva and lids normal, oropharynx clear with moist mucosa. Neck: Supple, no elevated JVP or carotid bruits, no thyromegaly. Lungs: Clear to auscultation, nonlabored breathing at rest. Cardiac: Regular rate and rhythm, no S3 or significant systolic murmur, no pericardial rub. Abdomen: Soft, nontender, no  hepatomegaly, bowel sounds present, no guarding or rebound. Extremities: No pitting edema, distal pulses 2+. Skin: Warm and dry. Musculoskeletal: No kyphosis. Neuropsychiatric: Alert and oriented x3, affect grossly appropriate.  Recent Labwork: 11/25/2022: TSH 0.277 11/29/2022: ALT 38; AST 33; BUN 14; Creatinine, Ser 1.21; Hemoglobin 9.7; Magnesium 1.5; Platelets 642; Potassium 3.5; Sodium 136     Component Value Date/Time   CHOL 131 08/10/2022 0417   TRIG 157 (H) 08/10/2022 0417   HDL 41 08/10/2022 0417   CHOLHDL 3.2 08/10/2022 0417   VLDL 31 08/10/2022 0417   LDLCALC 59 08/10/2022 0417     Assessment  and Plan:  Dizziness: Ongoing dizziness for the last 2 weeks.  No relation with rest or exercise.  Does not happen with positional changes.  Obtain 2-week event monitor. Drinks adequate water.  Cardiac chest pain, resolved: No interval angina.  Stress testing in 2025 showed no evidence of ischemia and echocardiogram was unremarkable (normal LVEF, normal diastology, no valvular heart disease).  Paroxysmal A-fib: Continue metoprolol titrate 25 mg twice daily and Eliquis 5 mg twice daily.  History of CVA: Not on aspirin due to Eliquis use, continue atorvastatin 40 mg at bedtime.  HTN, controlled: Continue current antihypertensive medications.  HLD, at goal: Continue atorvastatin 40 mg nightly.  Medication Adjustments/Labs and Tests Ordered: Current medicines are reviewed at length with the patient today.  Concerns regarding medicines are outlined above.    Disposition:  Follow up 1 year  Signed, Simon Llamas Verne Spurr, MD, 05/12/2023 10:02 AM    [facility] at [facility] [address], [place], [place] [address]

## 2023-05-12 NOTE — Telephone Encounter (Signed)
 Checking percert on the following   14 day zio-dizziness-VM

## 2023-05-18 ENCOUNTER — Ambulatory Visit: Admitting: Gastroenterology

## 2023-05-18 ENCOUNTER — Ambulatory Visit: Admitting: Urology

## 2023-05-24 ENCOUNTER — Inpatient Hospital Stay: Payer: Medicare HMO | Admitting: Neurology

## 2023-05-26 ENCOUNTER — Other Ambulatory Visit: Payer: Self-pay | Admitting: Internal Medicine

## 2023-06-02 ENCOUNTER — Ambulatory Visit: Admitting: Gastroenterology

## 2023-06-06 ENCOUNTER — Inpatient Hospital Stay: Payer: Medicare HMO | Admitting: Neurology

## 2023-06-07 ENCOUNTER — Ambulatory Visit: Admitting: Urology

## 2023-06-14 ENCOUNTER — Telehealth: Payer: Self-pay | Admitting: Internal Medicine

## 2023-06-14 NOTE — Telephone Encounter (Signed)
Patient calling in about her heart monitor results. Please advise

## 2023-06-16 NOTE — Telephone Encounter (Signed)
 The patient has been notified of the result and verbalized understanding.  All questions (if any) were answered. Casper Clement, [facility] 06/16/2023 3:49 PM

## 2023-06-17 ENCOUNTER — Telehealth: Payer: Self-pay | Admitting: Neurology

## 2023-06-17 NOTE — Telephone Encounter (Signed)
 rs appointment

## 2023-07-05 ENCOUNTER — Ambulatory Visit: Admitting: Gastroenterology

## 2023-07-08 DIAGNOSIS — R42 Dizziness and giddiness: Secondary | ICD-10-CM

## 2023-07-14 ENCOUNTER — Ambulatory Visit: Admitting: Urology

## 2023-07-25 ENCOUNTER — Other Ambulatory Visit: Payer: Self-pay | Admitting: Gastroenterology

## 2023-07-25 DIAGNOSIS — K581 Irritable bowel syndrome with constipation: Secondary | ICD-10-CM

## 2023-07-28 ENCOUNTER — Ambulatory Visit: Admitting: Gastroenterology

## 2023-07-29 ENCOUNTER — Ambulatory Visit: Payer: Self-pay | Admitting: Internal Medicine

## 2023-07-31 ENCOUNTER — Other Ambulatory Visit: Payer: Self-pay | Admitting: Gastroenterology

## 2023-07-31 DIAGNOSIS — K581 Irritable bowel syndrome with constipation: Secondary | ICD-10-CM

## 2023-08-06 ENCOUNTER — Other Ambulatory Visit: Payer: Self-pay | Admitting: Gastroenterology

## 2023-08-06 DIAGNOSIS — K581 Irritable bowel syndrome with constipation: Secondary | ICD-10-CM

## 2023-08-08 ENCOUNTER — Telehealth: Payer: Self-pay | Admitting: Orthopedic Surgery

## 2023-08-08 NOTE — Telephone Encounter (Signed)
 Returned the pt's call from 08/05/23 at 12:03pm, lvm for her to cb, she wants to schedule an appointment with Dr. Marvina Slough

## 2023-08-09 ENCOUNTER — Telehealth: Payer: Self-pay | Admitting: Orthopedic Surgery

## 2023-08-09 NOTE — Telephone Encounter (Signed)
 Returned the pt's call, lvm for her to cb to schedule.

## 2023-08-10 ENCOUNTER — Telehealth: Payer: Self-pay | Admitting: Neurology

## 2023-08-10 NOTE — Telephone Encounter (Signed)
 Spoke w/Pt to make her aware we are unable to provide a refill for her as she has not been seen by a provider in our office and is not our established Pt. Advised Pt to check with her PCP regarding refill as that who would typically manage the medication. Pt voiced understanding and thanks for the call back.

## 2023-08-10 NOTE — Telephone Encounter (Signed)
 Pt is needing her atorvastatin  (LIPITOR) 40 MG tablet called in to the [facility] in [place]

## 2023-08-16 ENCOUNTER — Other Ambulatory Visit (HOSPITAL_COMMUNITY): Payer: Self-pay | Admitting: Family Medicine

## 2023-08-16 DIAGNOSIS — Z1231 Encounter for screening mammogram for malignant neoplasm of breast: Secondary | ICD-10-CM

## 2023-08-22 ENCOUNTER — Ambulatory Visit: Admitting: Urology

## 2023-08-29 ENCOUNTER — Ambulatory Visit: Admitting: Orthopedic Surgery

## 2023-09-07 ENCOUNTER — Ambulatory Visit (HOSPITAL_COMMUNITY)

## 2023-09-12 ENCOUNTER — Ambulatory Visit: Admitting: Orthopedic Surgery

## 2023-09-13 ENCOUNTER — Ambulatory Visit: Admitting: Gastroenterology

## 2023-09-13 ENCOUNTER — Telehealth: Payer: Self-pay

## 2023-09-13 NOTE — Telephone Encounter (Signed)
 Pt request for Linzess  denied. Pt has not been in the office since 01/27/2022. Rx's has been sent in to her pharmacy in between her getting refills and canceling appts. 4 to 5 appts have been cancelled. Her next appt is 10/20/2023 with Therisa Stager, NP. Pt made aware of this and that she can contact her PCP for refills. Pt expressed understanding.

## 2023-09-14 ENCOUNTER — Inpatient Hospital Stay: Admitting: Neurology

## 2023-09-22 DIAGNOSIS — N390 Urinary tract infection, site not specified: Secondary | ICD-10-CM | POA: Insufficient documentation

## 2023-09-22 NOTE — Progress Notes (Deleted)
 Name: [name] DOB: 01/04/1951 MRN: [id number]  History of Present Illness: [name] is a 73 y.o. female who presents today as a new patient at [facility] [place]. All available relevant medical records have been reviewed.   She reports chief complaint of recurrent UTls.  Urine culture results in past 12 months: - 11/25/2022: Positive for E. Coli - 12/21/2022: Positive for Morganella morganii   - 01/19/2023: Positive for E. Coli  Urinary Symptoms: She reports *** UTl's in the last year. When present, UTI symptoms include ***dysuria, ***increased urinary urgency, ***frequency, ***.  She reports that UTI symptoms do ***not seem to correlate with intercourse.   She is ***taking Macrodantin (Nitrofurantoin) 50 mg daily for UTI prophylaxis.  She {Actions; denies-reports:120008} acute UTI symptoms today.  At baseline: She {Actions; denies-reports:120008} urinary urgency, frequency, dysuria, gross hematuria, hesitancy, straining to void, or sensations of incomplete emptying. She reports voiding *** times per day and *** times at night. She {Actions; denies-reports:120008} pushing on a bulge in order to empty her bladder.  She {Actions; denies-reports:120008} urge incontinence. She {Actions; denies-reports:120008} stress incontinence with ***cough/***laugh/***sneeze/***heavy lifting/***exercise. She {Actions; denies-reports:120008} enuresis. She leaks *** times per ***. Wears *** ***pads / ***diapers per day. She states the ***SUI / ***UUI is predominant. This has been going on for {NUMBERS 1-12:18279} {days/wks/mos/yrs:310907} and {ACTION; IS/IS WNU:[id number]} significantly bothersome. In terms of treatment, She has tried ***.  She {Actions; denies-reports:120008} caffeine intake.  She {Actions; denies-reports:120008} history of pyelonephritis.  She {Actions; denies-reports:120008} history of kidney stones.  Vaginal / prolapse Symptoms: She {Actions;  denies-reports:120008} vaginal bulge sensation.  She {Actions; denies-reports:120008} seeing a vaginal bulge. This bulge is bothersome and is the size of ***. It was first noticed ***.  She {Actions; denies-reports:120008} vaginal pain, bleeding, or discharge.  She {Actions; denies-reports:120008} use of topical vaginal estrogen cream.  Bowel Symptoms: She has a continent bowel movement *** time(s) per ***. Typical Bristol stool scale of continent bowel episodes is Type ***. She {Actions; denies-reports:120008} Fl episodes. Fecal incontinence started *** and occurs *** times per week. Typical Bristol stool scale of incontinent bowel episodes is Type ***. She {Actions; denies-reports:120008} straining and {Actions; denies-reports:120008} splinting to defecate. She {Actions; denies-reports:120008} rectal bleeding. She {Actions; denies-reports:120008} feeling like she fully empties her rectum with defecation. She {Actions; denies-reports:120008} urgency with defecation. She {Actions; denies-reports:120008} difficulty wiping clean. She {Actions; denies-reports:120008} taking laxatives, stool softeners, or fiber supplements. Last colonoscopy was***.  Past OB/GYN History: OB History   No obstetric history on file.    She {Actions; denies-reports:120008} being sexually active.  She {Actions; denies-reports:120008} dyspareunia. ***Dyspareunia is located ***at the introitus / ***deep inside the vagina. She has *** child(ren) who were delivered ***vaginally ***via c-section. She {Actions; denies-reports:120008} significant tearing with that ***delivery / those ***deliveries. She is {DESC; PRE/POST:32304} menopausal.  Last pap smear was ***. She {Actions; denies-reports:120008} history of *** hysterectomy.  Medications: Current Outpatient Medications  Medication Sig Dispense Refill   acetaminophen  (TYLENOL ) 650 MG CR tablet Take 1,300 mg by mouth every 8 (eight) hours as needed for pain. BID      albuterol  (VENTOLIN  HFA) 108 (90 Base) MCG/ACT inhaler Inhale 2 puffs into the lungs every 8 (eight) hours as needed for shortness of breath or wheezing. 18 g 3   apixaban  (ELIQUIS ) 5 MG TABS tablet Take 1 tablet (5 mg total) by mouth 2 (two) times daily. 60 tablet 2   atorvastatin  (LIPITOR) 40 MG tablet Take 1 tablet (40 mg total) by mouth  daily. 30 tablet 11   BYDUREON 2 MG PEN Inject 2 mg as directed. Every Sunday.  0   cyclobenzaprine  (FLEXERIL ) 10 MG tablet Take 1 tablet (10 mg total) by mouth 2 (two) times daily as needed for muscle spasms. (Patient not taking: Reported on 05/12/2023) 20 tablet 0   dicyclomine  (BENTYL ) 10 MG capsule Take 1 capsule (10 mg total) by mouth 3 (three) times daily as needed (abdominal cramping or diarrhea. Hold in setting of constipation.). 120 capsule 1   diltiazem  (CARDIZEM  CD) 120 MG 24 hr capsule Take 1 capsule by mouth once daily 30 capsule 5   famotidine  (PEPCID ) 20 MG tablet One after supper (Patient taking differently: Take 20 mg by mouth daily.) 30 tablet 3   gabapentin  (NEURONTIN ) 300 MG capsule Take 300 mg by mouth 3 (three) times daily.     Insulin  Glargine (BASAGLAR  KWIKPEN) 100 UNIT/ML Inject 18 Units into the skin at bedtime.     Insulin  Pen Needle 31G X 5 MM MISC by Does not apply route.     LINZESS  290 MCG CAPS capsule TAKE 1 CAPSULE BY MOUTH ONCE DAILY BEFORE BREAKFAST 90 capsule 0   meclizine (ANTIVERT) 25 MG tablet Take 25 mg by mouth 2 (two) times daily as needed for dizziness.     metFORMIN (GLUCOPHAGE) 500 MG tablet Take 500 mg by mouth 2 (two) times daily with a meal.     metoprolol  tartrate (LOPRESSOR ) 25 MG tablet Take 1 tablet by mouth twice daily 60 tablet 5   montelukast  (SINGULAIR ) 10 MG tablet Take 10 mg by mouth at bedtime.     nitrofurantoin (MACRODANTIN) 50 MG capsule Take 50 mg by mouth daily.     nitroGLYCERIN  (NITROSTAT ) 0.4 MG SL tablet Place 1 tablet (0.4 mg total) under the tongue every 5 (five) minutes x 3 doses as needed for  chest pain. 25 tablet 2   OVER THE COUNTER MEDICATION One touch Verio Reflect glucose machine.  One touch Verio Reflect glucose test strips 1-2 daily  One touch delica plus Lancets one lancet to check glucose bid.     pantoprazole  (PROTONIX ) 40 MG tablet Take 40 mg by mouth daily.  3   promethazine  (PHENERGAN ) 25 MG tablet Take 1 tablet by mouth every 12 (twelve) hours as needed.     sodium chloride  (OCEAN) 0.65 % SOLN nasal spray Place 1 spray into both nostrils as needed for congestion.     tizanidine (ZANAFLEX) 2 MG capsule SMARTSIG:1 Capsule(s) By Mouth Every 12 Hours PRN     TRESIBA FLEXTOUCH 100 UNIT/ML FlexTouch Pen SMARTSIG:15 Unit(s) SUB-Q Every Night     VASCEPA 1 g capsule Take 2 g by mouth 2 (two) times daily.     No current facility-administered medications for this visit.    Allergies: Allergies  Allergen Reactions   Ciprofloxacin Diarrhea and Nausea And Vomiting    stomach pain   Latex Itching   Sulfa Antibiotics Itching and Rash    Past Medical History:  Diagnosis Date   Bipolar 1 disorder (HCC)    Cancer (HCC)    Diabetes mellitus without complication (HCC)    Hypertension    PONV (postoperative nausea and vomiting)    Stroke [facility])    Past Surgical History:  Procedure Laterality Date   ABDOMINAL HYSTERECTOMY     CHOLECYSTECTOMY     COLONOSCOPY WITH PROPOFOL  N/A 10/06/2020   Surgeon: Cindie Carlin POUR, DO; nonbleeding internal hemorrhoids, 5 mm tubular adenoma removed, 3 mm hyperplastic polyp removed,  otherwise normal exam.  Recommended 5-year surveillance.   ORIF FEMUR FRACTURE Left 11/30/2016   Procedure: OPEN REDUCTION INTERNAL FIXATION (ORIF) DISTAL FEMUR FRACTURE;  Surgeon: Fidel Rogue, MD;  Location: [facility] OR;  Service: Orthopedics;  Laterality: Left;   POLYPECTOMY  10/06/2020   Procedure: POLYPECTOMY INTESTINAL;  Surgeon: Cindie Carlin POUR, DO;  Location[facility];  Service: Endoscopy;;   stroke     TONSILLECTOMY     Family History  Problem  Relation Age of Onset   CAD Father    CAD Brother    Social History   Socioeconomic History   Marital status: Married    Spouse name: Not on file   Number of children: Not on file   Years of education: Not on file   Highest education level: Not on file  Occupational History   Not on file  Tobacco Use   Smoking status: Former    Current packs/day: 0.00    Average packs/day: 3.0 packs/day for 20.0 years (60.0 ttl pk-yrs)    Types: Cigarettes    Start date: 09/26/1980    Quit date: 09/26/2000    Years since quitting: 23.0   Smokeless tobacco: Never  Vaping Use   Vaping status: Never Used  Substance and Sexual Activity   Alcohol use: No   Drug use: No   Sexual activity: Not Currently  Other Topics Concern   Not on file  Social History Narrative   Not on file   Social Drivers of Health   Financial Resource Strain: Not on file  Food Insecurity: No Food Insecurity (11/25/2022)   Hunger Vital Sign    Worried About Running Out of Food in the Last Year: Never true    Ran Out of Food in the Last Year: Never true  Transportation Needs: No Transportation Needs (11/25/2022)   PRAPARE - Administrator, Civil Service (Medical): No    Lack of Transportation (Non-Medical): No  Physical Activity: Not on file  Stress: Not on file  Social Connections: Not on file  Intimate Partner Violence: Not At Risk (11/25/2022)   Humiliation, Afraid, Rape, and Kick questionnaire    Fear of Current or Ex-Partner: No    Emotionally Abused: No    Physically Abused: No    Sexually Abused: No    SUBJECTIVE  Review of Systems Constitutional: Patient denies any unintentional weight loss or change in strength lntegumentary: Patient denies any rashes or pruritus Cardiovascular: Patient denies chest pain or syncope Respiratory: Patient denies shortness of breath Gastrointestinal: ***As per HPI Musculoskeletal: Patient denies muscle cramps or weakness Neurologic: Patient denies convulsions or  seizures Allergic/Immunologic: Patient denies recent allergic reaction(s) Hematologic/Lymphatic: Patient denies bleeding tendencies Endocrine: Patient denies heat/cold intolerance  GU: As per HPI.  OBJECTIVE There were no vitals filed for this visit. There is no height or weight on file to calculate BMI.  Physical Examination Constitutional: No obvious distress; patient is non-toxic appearing  Cardiovascular: No visible lower extremity edema.  Respiratory: The patient does not have audible wheezing/stridor; respirations do not appear labored  Gastrointestinal: Abdomen non-distended Musculoskeletal: Normal ROM of UEs  Skin: No obvious rashes/open sores  Neurologic: CN 2-12 grossly intact Psychiatric: Answered questions appropriately with normal affect  Hematologic/Lymphatic/Immunologic: No obvious bruises or sites of spontaneous bleeding  UA: ***negative ***positive for *** leukocytes, *** blood, ***nitrites Urine microscopy: *** WBC/hpf, *** RBC/hpf, *** bacteria ***glucosuria (secondary to ***Jardiance ***Farxiga use) ***otherwise unremarkable  PVR: *** ml  ASSESSMENT No diagnosis found.  ***Recurrent  UTls / ***History of UTls with insufficient urine culture data to formally validate recurrent UTI diagnosis:  We discussed the possible etiologies of recurrent UTls including ascending infection related to intercourse; ***vaginal atrophy; transmural infection that has been treated incompletely; urinary tract stones; incomplete bladder emptying with urinary stasis; kidney or bladder tumor; urethral diverticulum; and colonization of  ***vagina and urinary tract with pathologic, adherent organisms.   Patient has known UTI risk factors including:  - ***use of immunosuppressant medication - ***T2DM - ***glucosuria secondary to ***Jardiance ***Farxiga use - ***incomplete bladder emptying - ***vaginal atrophy - ***fecal incontinence / soiling  ***We reviewed the diagnostic criteria  for recurrent UTI and that we do not currently have adequate urine culture data to support or refute the formal diagnosis of recurrent UTI. Discussed other possible etiologies for urinary symptoms including but not limited to: non-infectious cystitis (such as interstitial cystitis), stone, mass, malignancy, vulvovaginal irritation, etc.  ***Will request any additional outside urine cultures for review.  *** We discussed the difference between urinalysis (urine dipstick test) vs. urine culture and why urine cultures are needed to determine appropriate diagnosis and treatment of urologic symptoms. *** We reviewed UTI symptoms which may include: dysuria, acutely increased urinary urgency and/or frequency, fever, acute mental status change / confusion, general malaise, fatigue, weakness. *** We discussed that urine color, clarity, and odor are not considered to be clinical indicators of UTI and do not warrant urine testing unless patient is acutely symptomatic for UTI.  For UTI prevention advised: > Maintain adequate fluid intake daily to flush out the urinary tract. > Go to the bathroom to urinate every 4-6 hours while awake to minimize urinary stasis / bacterial overgrowth in the bladder. ***> Starting topical vaginal estrogen cream. The rationale, appropriate use, and potential pros / cons were discussed in detail.  ***> UTI prophylaxis with antibiotic after intercourse. ***> UTI prophylaxis with a daily low dose antibiotic.   Handout provided with additional recommendations / options for UTI prevention.   For management of acute UTI symptoms: - ***Discussed option to take over-the-counter Pyridium (commonly known under the AZO brand name) for bladder pain. No more than 3 days at a time.  ***We discussed the role of diagnostic testing for further evaluation and investigation of potential infectious nidus such as: cystoscopy - RUS CT stone study Microgen PCR testing  Patient ultimately decided  to start with *** and ***will consider OTC supplements for UTI prevention. Handout provided.  We agreed to plan for follow up in *** months or sooner if needed.  Patient verbalized understanding of and agreement with current plan. All questions were answered.  PLAN Advised the following: ***. Start topical vaginal estrogen cream as prescribed. ***. Maintain adequate fluid intake daily to flush out the urinary tract. ***. Urinate every 4-6 hours while awake to minimize urinary stasis / bacterial overgrowth in the bladder. ***. Consider OTC supplements for UTI prevention. ***. ***No follow-ups on file.  No orders of the defined types were placed in this encounter.   It has been explained that the patient is to follow regularly with their PCP in addition to all other providers involved in their care and to follow instructions provided by these respective offices. Patient advised to contact urology clinic if any urologic-pertaining questions, concerns, new symptoms or problems arise in the interim period.  There are no Patient Instructions on file for this visit.  Electronically signed by:  Lauraine KYM Oz, MSN, FNP-C, CUNP 09/22/2023 2:06 PM

## 2023-09-23 ENCOUNTER — Ambulatory Visit: Admitting: Urology

## 2023-09-23 ENCOUNTER — Telehealth: Payer: Self-pay | Admitting: Orthopedic Surgery

## 2023-09-23 DIAGNOSIS — N390 Urinary tract infection, site not specified: Secondary | ICD-10-CM

## 2023-09-23 NOTE — Telephone Encounter (Signed)
 Called to cancel appt   She needs to see a spine specialist or neurosurgeron

## 2023-09-26 ENCOUNTER — Ambulatory Visit: Admitting: Orthopedic Surgery

## 2023-09-28 ENCOUNTER — Ambulatory Visit (HOSPITAL_COMMUNITY)

## 2023-10-06 ENCOUNTER — Ambulatory Visit: Admitting: Orthopaedic Surgery

## 2023-10-14 ENCOUNTER — Encounter: Payer: Self-pay | Admitting: Radiology

## 2023-10-20 ENCOUNTER — Telehealth: Payer: Self-pay | Admitting: *Deleted

## 2023-10-20 ENCOUNTER — Ambulatory Visit (INDEPENDENT_AMBULATORY_CARE_PROVIDER_SITE_OTHER): Admitting: Gastroenterology

## 2023-10-20 ENCOUNTER — Encounter: Payer: Self-pay | Admitting: Gastroenterology

## 2023-10-20 VITALS — BP 115/74 | HR 58 | Temp 98.6°F | Ht 63.0 in | Wt 219.0 lb

## 2023-10-20 DIAGNOSIS — Z7901 Long term (current) use of anticoagulants: Secondary | ICD-10-CM

## 2023-10-20 DIAGNOSIS — K219 Gastro-esophageal reflux disease without esophagitis: Secondary | ICD-10-CM

## 2023-10-20 DIAGNOSIS — K5900 Constipation, unspecified: Secondary | ICD-10-CM

## 2023-10-20 DIAGNOSIS — Z860101 Personal history of adenomatous and serrated colon polyps: Secondary | ICD-10-CM | POA: Diagnosis not present

## 2023-10-20 DIAGNOSIS — Z860102 Personal history of hyperplastic colon polyps: Secondary | ICD-10-CM

## 2023-10-20 DIAGNOSIS — R1310 Dysphagia, unspecified: Secondary | ICD-10-CM | POA: Diagnosis not present

## 2023-10-20 DIAGNOSIS — Z8673 Personal history of transient ischemic attack (TIA), and cerebral infarction without residual deficits: Secondary | ICD-10-CM

## 2023-10-20 MED ORDER — LUBIPROSTONE 24 MCG PO CAPS: 24.0000 ug | ORAL_CAPSULE | Freq: Two times a day (BID) | ORAL | 3 refills | Status: DC

## 2023-10-20 NOTE — Patient Instructions (Addendum)
 Let's stop Linzess . Instead, start Amitiza . You will take one gelcap twice a day WITH FOOD to avoid nausea. Please let me know how this works for you!  If you need to add a stool softener, you can use docusate sodium  100 mg (once or twice a day). Try the Amitiza  first.   Continue pantoprazole  once a day.  We are arranging an upper endoscopy with dilation in the near future.  You will need to hold Eliquis  48 hours before the procedure. No metformin the day of the procedure  We will see you back in 3 months!  I enjoyed seeing you again today! I value our relationship and want to provide genuine, compassionate, and quality care. You may receive a survey regarding your visit with me, and I welcome your feedback! Thanks so much for taking the time to complete this. I look forward to seeing you again.      Therisa MICAEL Stager, PhD, ANP-BC [facility]

## 2023-10-20 NOTE — Progress Notes (Addendum)
 Gastroenterology Office Note     Primary Care Physician:  Roni, [facility]  Primary Gastroenterologist: Dr. Cindie   Chief Complaint   Chief Complaint  Patient presents with   Follow-up    Follow up GERD and constipation. Pt needs refills     History of Present Illness   [name] is a 73 y.o. female presenting today with a history of chronic GERD, adenomatous colon polyps due for surveillance in 2027, and constipation. Stroke in interim July 2024. PCP has taken over management of Eliquis . Last seen in Dec 2023 and here for refills of Linzess  and pantoprazole .   Was off Linzess  several months. Felt impacted. Enemas without improvement. Had to manually evacuate. PCP refilled. Will skip a few days with current linzess  dosing 290 mcg.   Senna with Linzess  caused diarrhea. Miralax  was better if just one dose with it. Linzess  290 mcg. Having hard stool. Having to take Miralax  every day. Trying to drink more water . Cut back on soda.   Pantoprazole  daily controlling GERD.   Last July onset of solid food dysphagia intermittently. Sometimes pill dysphagia. Having to drink multiple sips of water . Remote dilation in the past many years ago. Felt improvement after this.    Colonoscopy Aug 2022: tubular adenoma and hyperplastic, internal hemorrhoids, surveillance 2027.   Past Medical History:  Diagnosis Date   Bipolar 1 disorder (HCC)    Cancer (HCC)    Diabetes mellitus without complication (HCC)    Hypertension    PONV (postoperative nausea and vomiting)    Stroke [facility])     Past Surgical History:  Procedure Laterality Date   ABDOMINAL HYSTERECTOMY     CHOLECYSTECTOMY     COLONOSCOPY WITH PROPOFOL  N/A 10/06/2020   Surgeon: Cindie Carlin POUR, DO; nonbleeding internal hemorrhoids, 5 mm tubular adenoma removed, 3 mm hyperplastic polyp removed, otherwise normal exam.  Recommended 5-year surveillance.   ORIF FEMUR FRACTURE Left 11/30/2016   Procedure: OPEN  REDUCTION INTERNAL FIXATION (ORIF) DISTAL FEMUR FRACTURE;  Surgeon: Fidel Rogue, MD;  Location: [facility] OR;  Service: Orthopedics;  Laterality: Left;   POLYPECTOMY  10/06/2020   Procedure: POLYPECTOMY INTESTINAL;  Surgeon: Cindie Carlin POUR, DO;  Location: [facility];  Service: Endoscopy;;   stroke     TONSILLECTOMY      Current Outpatient Medications  Medication Sig Dispense Refill   acetaminophen  (TYLENOL ) 650 MG CR tablet Take 1,300 mg by mouth every 8 (eight) hours as needed for pain. BID     albuterol  (VENTOLIN  HFA) 108 (90 Base) MCG/ACT inhaler Inhale 2 puffs into the lungs every 8 (eight) hours as needed for shortness of breath or wheezing. 18 g 3   apixaban  (ELIQUIS ) 5 MG TABS tablet Take 1 tablet (5 mg total) by mouth 2 (two) times daily. 60 tablet 2   atorvastatin  (LIPITOR) 40 MG tablet Take 1 tablet (40 mg total) by mouth daily. 30 tablet 11   calcium  carbonate (OSCAL) 1500 (600 Ca) MG TABS tablet Take 1,500 mg by mouth 2 (two) times daily with a meal.     dicyclomine  (BENTYL ) 10 MG capsule Take 1 capsule (10 mg total) by mouth 3 (three) times daily as needed (abdominal cramping or diarrhea. Hold in setting of constipation.). 120 capsule 1   diltiazem  (CARDIZEM  CD) 120 MG 24 hr capsule Take 1 capsule by mouth once daily 30 capsule 5   famotidine  (PEPCID ) 20 MG tablet One after supper 30 tablet 3   gabapentin  (NEURONTIN ) 300 MG  capsule Take 300 mg by mouth 3 (three) times daily.     Insulin  Pen Needle 31G X 5 MM MISC by Does not apply route.     LINZESS  290 MCG CAPS capsule TAKE 1 CAPSULE BY MOUTH ONCE DAILY BEFORE BREAKFAST 90 capsule 0   meclizine (ANTIVERT) 25 MG tablet Take 25 mg by mouth 2 (two) times daily as needed for dizziness.     metFORMIN (GLUCOPHAGE) 500 MG tablet Take 500 mg by mouth 2 (two) times daily with a meal.     metoprolol  tartrate (LOPRESSOR ) 25 MG tablet Take 1 tablet by mouth twice daily 60 tablet 5   montelukast  (SINGULAIR ) 10 MG tablet Take 10 mg by mouth  at bedtime.     nitrofurantoin (MACRODANTIN) 50 MG capsule Take 50 mg by mouth daily.     nitroGLYCERIN  (NITROSTAT ) 0.4 MG SL tablet Place 1 tablet (0.4 mg total) under the tongue every 5 (five) minutes x 3 doses as needed for chest pain. 25 tablet 2   pantoprazole  (PROTONIX ) 40 MG tablet Take 40 mg by mouth daily.  3   promethazine  (PHENERGAN ) 25 MG tablet Take 1 tablet by mouth every 12 (twelve) hours as needed.     sodium chloride  (OCEAN) 0.65 % SOLN nasal spray Place 1 spray into both nostrils as needed for congestion.     tizanidine (ZANAFLEX) 2 MG capsule SMARTSIG:1 Capsule(s) By Mouth Every 12 Hours PRN     TRESIBA FLEXTOUCH 100 UNIT/ML FlexTouch Pen SMARTSIG:15 Unit(s) SUB-Q Every Night     VASCEPA 1 g capsule Take 2 g by mouth 2 (two) times daily.     BYDUREON 2 MG PEN Inject 2 mg as directed. Every Sunday. (Patient not taking: Reported on 10/20/2023)  0   cyclobenzaprine  (FLEXERIL ) 10 MG tablet Take 1 tablet (10 mg total) by mouth 2 (two) times daily as needed for muscle spasms. (Patient not taking: Reported on 10/20/2023) 20 tablet 0   Insulin  Glargine (BASAGLAR  KWIKPEN) 100 UNIT/ML Inject 18 Units into the skin at bedtime. (Patient not taking: Reported on 10/20/2023)     OVER THE COUNTER MEDICATION One touch Verio Reflect glucose machine.  One touch Verio Reflect glucose test strips 1-2 daily  One touch delica plus Lancets one lancet to check glucose bid.     No current facility-administered medications for this visit.    Allergies as of 10/20/2023 - Review Complete 10/20/2023  Allergen Reaction Noted   Ciprofloxacin Diarrhea and Nausea And Vomiting 09/30/2020   Latex Itching 11/30/2016   Sulfa antibiotics Itching and Rash 11/29/2016    Family History  Problem Relation Age of Onset   CAD Father    CAD Brother     Social History   Socioeconomic History   Marital status: Married    Spouse name: Not on file   Number of children: Not on file   Years of education: Not on  file   Highest education level: Not on file  Occupational History   Not on file  Tobacco Use   Smoking status: Former    Current packs/day: 0.00    Average packs/day: 3.0 packs/day for 20.0 years (60.0 ttl pk-yrs)    Types: Cigarettes    Start date: 09/26/1980    Quit date: 09/26/2000    Years since quitting: 23.0   Smokeless tobacco: Never  Vaping Use   Vaping status: Never Used  Substance and Sexual Activity   Alcohol use: No   Drug use: No   Sexual activity: Not  Currently  Other Topics Concern   Not on file  Social History Narrative   Not on file   Social Drivers of Health   Financial Resource Strain: Not on file  Food Insecurity: No Food Insecurity (11/25/2022)   Hunger Vital Sign    Worried About Running Out of Food in the Last Year: Never true    Ran Out of Food in the Last Year: Never true  Transportation Needs: No Transportation Needs (11/25/2022)   PRAPARE - Administrator, Civil Service (Medical): No    Lack of Transportation (Non-Medical): No  Physical Activity: Not on file  Stress: Not on file  Social Connections: Not on file  Intimate Partner Violence: Not At Risk (11/25/2022)   Humiliation, Afraid, Rape, and Kick questionnaire    Fear of Current or Ex-Partner: No    Emotionally Abused: No    Physically Abused: No    Sexually Abused: No     Review of Systems   Gen: Denies any fever, chills, fatigue, weight loss, lack of appetite.  CV: Denies chest pain, heart palpitations, peripheral edema, syncope.  Resp: Denies shortness of breath at rest or with exertion. Denies wheezing or cough.  GI: see HPI GU : Denies urinary burning, urinary frequency, urinary hesitancy MS: Denies joint pain, muscle weakness, cramps, or limitation of movement.  Derm: Denies rash, itching, dry skin Psych: Denies depression, anxiety, memory loss, and confusion Heme: Denies bruising, bleeding, and enlarged lymph nodes.   Physical Exam   BP 115/74   Pulse (!) 58    Temp 98.6 F (37 C)   Ht 5' 3 (1.6 m)   Wt 219 lb (99.3 kg)   BMI 38.79 kg/m  General:   Alert and oriented. Pleasant and cooperative. Well-nourished and well-developed.  Head:  Normocephalic and atraumatic. Eyes:  Without icterus Abdomen:  +BS, soft, non-tender and non-distended. No HSM noted. No guarding or rebound. No masses appreciated.  Rectal:  Deferred  Msk:  Symmetrical without gross deformities. Normal posture. Extremities:  Without edema. Neurologic:  Alert and  oriented x4;  grossly normal neurologically. Skin:  Intact without significant lesions or rashes. Psych:  Alert and cooperative. Normal mood and affect.   Assessment   [name] is a 73 y.o. female presenting today with a history of chronic GERD, adenomatous colon polyps due for surveillance in 2027, and constipation. Stroke July 2024 and now on Eliquis , which PCP has been managing. Last seen in Dec 2023 and noting constipation and dysphagia.  Constipation: not ideally managed even with highest dose Linzess  and Miralax . Will trial  Amitiza  24 mcg po BID with food. As she prefers to avoid Miralax  as unpredictable, can add stool softener if needed.   Dysphagia: starting July with solid food and pill dysphagia. Remote dilation with improvement (unable to see report). Will arrange EGD/dilation in the future.  GERD controlled on PPI daily. I  Requesting to hold Eliquis  X 48 hours.     PLAN    Request to hold Eliquis  X 48 hours prior Continue PPI daily Proceed with upper endoscopy/dilation by Dr. Cindie in near future: the risks, benefits, and alternatives have been discussed with the patient in detail. The patient states understanding and desires to proceed.  Stop Linzess , trial of Amitiza  24 mcg po BID Return in 3 months   Therisa MICAEL Stager, PhD, ANP-BC [facility]   10/9: received clearance to hold Eliquis  X 48 hours. Therisa MICAEL Stager, PhD, ANP-BC [facility]

## 2023-10-20 NOTE — Telephone Encounter (Signed)
  Request for patient to stop medication prior to procedure or is needing cleareance  10/20/23  CRISTYN CROSSNO 01-Jun-1950  What type of surgery is being performed? Esophagogastroduodenoscopy (EGD) with esophageal dilation  When is surgery scheduled? TBD  What type of clearance is required (medical or pharmacy to hold medication or both? medication  Are there any medications that need to be held prior to surgery and how long? Eliquis  x 2 days  Name of physician performing surgery?  Dr.Robert Shaaron [facility]: [phone], option 5 Fax: [phone]  Anesthesia type (none, local, MAC, general)? MAC   ? Yes ? No Patient can hold medication as requested   Signature: ___________________________

## 2023-10-20 NOTE — Telephone Encounter (Signed)
 Faxed clearance to PCP.

## 2023-10-25 ENCOUNTER — Ambulatory Visit: Admitting: Urology

## 2023-10-31 ENCOUNTER — Other Ambulatory Visit (HOSPITAL_COMMUNITY): Payer: Self-pay | Admitting: Adult Health

## 2023-10-31 ENCOUNTER — Ambulatory Visit (HOSPITAL_COMMUNITY)

## 2023-10-31 DIAGNOSIS — M5459 Other low back pain: Secondary | ICD-10-CM

## 2023-11-02 ENCOUNTER — Ambulatory Visit (HOSPITAL_COMMUNITY)
Admission: RE | Admit: 2023-11-02 | Discharge: 2023-11-02 | Disposition: A | Discharge status: Home / Self Care | Source: Ambulatory Visit | Attending: Adult Health | Admitting: Adult Health

## 2023-11-02 DIAGNOSIS — M5459 Other low back pain: Secondary | ICD-10-CM | POA: Insufficient documentation

## 2023-11-03 ENCOUNTER — Other Ambulatory Visit (INDEPENDENT_AMBULATORY_CARE_PROVIDER_SITE_OTHER): Payer: Self-pay

## 2023-11-03 ENCOUNTER — Ambulatory Visit: Admitting: Orthopedic Surgery

## 2023-11-03 VITALS — BP 142/56 | HR 62 | Ht 63.0 in | Wt 219.0 lb

## 2023-11-03 DIAGNOSIS — M810 Age-related osteoporosis without current pathological fracture: Secondary | ICD-10-CM | POA: Diagnosis not present

## 2023-11-03 DIAGNOSIS — M4036 Flatback syndrome, lumbar region: Secondary | ICD-10-CM

## 2023-11-03 NOTE — Progress Notes (Signed)
 Orthopedic Spine Surgery Office Note  Assessment: Patient is a 73 y.o. female with three issues:  Lumbar radiculopathy Low back pain and sagittal imbalance Osteoporosis   Plan: -Patient has tried topical creams, lidocaine  patches, tylenol , gabapentin , tizanidine, norco  -Can use tylenol  up to 1000mg  TID -Patient has a history of osteoporosis but is not getting any treatment.  I provided her with a referral to our osteoporosis clinic.  She would need at least 8 weeks of treatment prior to any elective spine surgery given that it would involve a fusion -Patient would need to lose weight prior to elective spine surgery as well.  Give her a weight goal of 195 pounds -Patient should return to office in 3 months, x-rays at next visit: none   Patient expressed understanding of the plan and all questions were answered to the patient's satisfaction.   ___________________________________________________________________________   History:  Patient is a 73 y.o. female who presents today for lumbar spine.  Patient has had several years of low back pain which has been getting progressively worse with time.  She also has noted pain that radiates into the left lower extremity.  She feels the going along the anterior aspect of the thigh to the level of the knee.  She does not have any pain radiating to the right lower extremity.  Pain is worse with activity but she also feels it at rest.  There is no trauma or injury that preceded the onset of the pain.  She has tried multiple medications but has not noticed any relief with any of those.  Has a history of urinary incontinence since the age of 52.  No recent changes in bowel or bladder habits.  No saddle anesthesia.  Treatments tried: topical creams, lidocaine  patches, tylenol , gabapentin , tizanidine, norco   Review of systems: Denies fevers and chills, night sweats, unexplained weight loss. Has had pain that wakes her at night.  Has a history of cervical  cancer  Past medical history: HLD HTN History of MI Osteoporosis History of stroke IBS Atrial fibrillation DM RA Vertigo Chronic pain  Allergies: ciprofloxacin, latex, sulfa  Past surgical history:  Tonsillectomy Cholecystectomy  Left femur fracture ORIF Hysterectomy  Social history: Denies use of nicotine product (smoking, vaping, patches, smokeless) Alcohol use: denies Denies recreational drug use   Physical Exam:  BMI of 38.8  General: no acute distress, appears stated age Neurologic: alert, answering questions appropriately, following commands Respiratory: unlabored breathing on room air, symmetric chest rise Psychiatric: appropriate affect, normal cadence to speech   MSK (spine):  -Strength exam      Left  Right EHL    5/5  5/5 TA    5/5  5/5 GSC    5/5  5/5 Knee extension  5/5  5/5 Hip flexion   5/5  5/5  -Sensory exam    Sensation intact to light touch in L3-S1 nerve distributions of bilateral lower extremities  Imaging: XRs scoliosis from 11/03/2023 were independently reviewed and interpreted, showing loss of lumbar lordosis.  SVA of 14 cm.  PI of 35, LL of 3.  Disc height loss at L3/4, L4/5, L5/S1. No spondylolisthesis seen.  No fracture or dislocation seen.   Patient name: [name] Patient MRN: [id number] Date of visit: 11/03/23

## 2023-11-08 NOTE — Telephone Encounter (Signed)
 Clearance scanned under media tab

## 2023-11-14 ENCOUNTER — Telehealth: Payer: Self-pay | Admitting: Internal Medicine

## 2023-11-14 MED ORDER — DILTIAZEM HCL ER COATED BEADS 120 MG PO CP24: 120.0000 mg | ORAL_CAPSULE | Freq: Every day | ORAL | 5 refills | Status: AC

## 2023-11-14 MED ORDER — METOPROLOL TARTRATE 25 MG PO TABS: 25.0000 mg | ORAL_TABLET | Freq: Two times a day (BID) | ORAL | 5 refills | Status: AC

## 2023-11-14 NOTE — Telephone Encounter (Signed)
*  STAT* If patient is at the pharmacy, call can be transferred to refill team.   1. Which medications need to be refilled? (please list name of each medication and dose if known)   diltiazem  (CARDIZEM  CD) 120 MG 24 hr capsule  metoprolol  tartrate (LOPRESSOR ) 25 MG tablet    2. Would you like to learn more about the convenience, safety, & potential cost savings by using the [facility]? No   3. Are you open to using the [facility] (Type [facility]. No.   4. Which pharmacy/location (including street and city if local pharmacy) is medication to be sent to?[facility] - [place], [place] - [address]    5. Do they need a 30 day or 90 day supply? 90 day

## 2023-11-14 NOTE — Telephone Encounter (Signed)
 Filled

## 2023-11-16 ENCOUNTER — Ambulatory Visit (HOSPITAL_COMMUNITY)

## 2023-11-21 ENCOUNTER — Encounter: Admitting: Physician Assistant

## 2023-12-01 NOTE — Telephone Encounter (Signed)
 Clearance reviewed. Hold Eliquis  X 48 hours prior. Please arrange EGD/dil with Cindie, ASA 3.

## 2023-12-02 NOTE — Telephone Encounter (Signed)
 LMTRC

## 2023-12-05 ENCOUNTER — Encounter: Admitting: Physician Assistant

## 2023-12-05 ENCOUNTER — Ambulatory Visit (HOSPITAL_COMMUNITY)

## 2023-12-05 NOTE — Telephone Encounter (Signed)
 LMTRC

## 2023-12-06 NOTE — Telephone Encounter (Signed)
 Left message with family member to have pt return call  EGD/ED w/Dr.Carver, asa 3

## 2023-12-08 ENCOUNTER — Encounter: Payer: Self-pay | Admitting: *Deleted

## 2023-12-08 NOTE — Telephone Encounter (Signed)
 Letter mailed

## 2023-12-12 ENCOUNTER — Encounter: Payer: Self-pay | Admitting: Physician Assistant

## 2023-12-12 ENCOUNTER — Ambulatory Visit (INDEPENDENT_AMBULATORY_CARE_PROVIDER_SITE_OTHER): Admitting: Physician Assistant

## 2023-12-12 VITALS — Ht 62.5 in | Wt 214.6 lb

## 2023-12-12 DIAGNOSIS — M810 Age-related osteoporosis without current pathological fracture: Secondary | ICD-10-CM

## 2023-12-12 NOTE — Progress Notes (Addendum)
 Office Visit Note   Patient: [name]           Date of Birth: 1950/02/26           MRN: [id number] Visit Date: 12/12/2023              Requested by: [name] [facility] [address] [place],  [place] [address] PCP: Lori, [name]   Assessment & Plan: Visit Diagnoses:  1. Age-related osteoporosis without current pathological fracture     Plan: [name] is a pleasant 73 year old woman who is referred by [name].  [name] was diagnosed with osteoporosis with a T-score of -2.7 3 years ago.  [name] does recall being placed on Fosamax but [name] was unable to tolerate it.  [name] does have a history of stomach issues and is medicated for this also history of ulcers.  [name] has not had a bone density since 2022 we will order 1 today.  [name] does have some early kidney disease which would also negate using both Lori max.  [name] has had a recent CVA [name] thinks over Lori summer and a history of heart issues so Evenity would not be appropriate.  [name] also has a history of cancer so would try not to do Tymlos.  I do think [name] should be started on something as by x-rays [name] does have osteoporosis and is anticipating possible spine surgery with [name].  [name] said it would be 8 weeks of treatment before they could consider this.  I spent 45 minutes reviewing her chart and discussing things with her options.  We discussed side effects at certainly it is very clear that [name] would be a good candidate for Prolia.  We discussed avascular necrosis of Lori jaw as well as issues with calcium  depletion and atypical femur fracture.  We discussed less serious but more common of side effects like muscle pain.  [name] was provided information about Prolia.  Certainly [name] could do Lori biosimilar if Prolia was not approved.  Also [name] was given information about tracking her calcium  content and vitamin D.  [name] has just had fairly recent lab work done this past summer at [name].  [name] is going to try and obtain this  through her primary care doctor.  I suspect there will be some labs we might have to do prior to beginning treatment but I would hate to be repetitive if some of these labs have been recently done who reviewed her questionnaire form.  [name] did have a recent bone density of -2.7 to 3 years ago.  [name] has no history of fracture [name] does have a history of heart disease and stroke with a possible CVA over Lori summer.  [name] also has a history of gastric and peptic ulcer disease.  [name] went through menopause in her late 47s.  [name] is currently supplementing 900 mg of calcium  a day and 1500 international units of vitamin D.  [name] has never consumed alcoholic beverages [name] is a former smoker [name] did hormone replacement in Lori early 2000's.  [name] has no history of dental issues and [name] has a family history of her [name] and [name] both sustaining fractures.  Based on this [name] has a 24% of osteoporotic fracture in Lori next 10 years and specifically a 10% risk of a hip fracture  Follow-Up Instructions: Return if symptoms worsen or fail to improve.   Orders:  No orders of Lori defined types were placed in this  encounter.  No orders of Lori defined types were placed in this encounter.     Procedures: No procedures performed   Clinical Data: No additional findings.   Subjective: Chief Complaint  Patient presents with   osteoporosis management    HPI patient is a pleasant 73 year old woman who is referred by [name].  For evaluation osteoporosis  Review of Systems  All other systems reviewed and are negative.    Objective: Vital Signs: Ht 5' 2.5 (1.588 m)   Wt 214 lb 9.6 oz (97.3 kg)   BMI 38.63 kg/m   Physical Exam Constitutional:      Appearance: Normal appearance.  Pulmonary:     Effort: Pulmonary effort is normal.  Skin:    General: Skin is warm and dry.  Neurological:     General: No focal deficit present.     Mental Status: [name] is alert and oriented to person, place, and time.   Psychiatric:        Mood and Affect: Mood normal.        Behavior: Behavior normal.       Specialty Comments:  No specialty comments available.  Imaging: No results found.   PMFS History: Patient Active Problem List   Diagnosis Date Noted   Dysphagia 10/20/2023   Recurrent UTI 09/22/2023   Dizziness 05/12/2023   PAF (paroxysmal atrial fibrillation) (HCC) 12/15/2022   HLD (hyperlipidemia) 12/15/2022   Chest pain of uncertain etiology 12/15/2022   Atrial fibrillation with RVR (HCC) 11/25/2022   Stroke (HCC) 11/25/2022   Prolonged QT interval 11/25/2022   Diarrhea 11/25/2022   Left sided numbness 08/09/2022   Chronic kidney disease due to type 2 diabetes mellitus (HCC) 04/05/2022   Low back pain 04/05/2022   Constipation 10/07/2021   Right lower quadrant abdominal pain 10/07/2021   Nausea without vomiting 10/07/2021   Upper airway cough syndrome 05/07/2021   Age-related osteoporosis without current pathological fracture 11/13/2020   GERD (gastroesophageal reflux disease) 09/04/2020   Chronic obstructive pulmonary disease (HCC) 03/22/2020   Body mass index (BMI) 45.0-49.9, adult (HCC) 09/18/2019   Chronic kidney disease due to hypertension 09/18/2019   Polyneuropathy due to type 2 diabetes mellitus (HCC) 09/18/2019   Vitamin D deficiency 09/18/2019   Stage 3a chronic kidney disease (HCC) 09/18/2019   Anxiety disorder 01/30/2019   Long term current use of insulin  (HCC) 01/30/2019   Chest pain 04/05/2018   Essential hypertension 11/30/2016   Bipolar 1 disorder (HCC) 11/30/2016   Diabetes mellitus without complication (HCC) 11/30/2016   Closed fracture of left distal femur (HCC) 11/30/2016   Other fracture of left femur, initial encounter for closed fracture (HCC) 11/29/2016   Morbid obesity (HCC) 11/29/2016   Past Medical History:  Diagnosis Date   Bipolar 1 disorder (HCC)    Cancer (HCC)    Diabetes mellitus without complication (HCC)    Hypertension    PONV  (postoperative nausea and vomiting)    Stroke (HCC) 08/2022    Family History  Problem Relation Age of Onset   CAD Father    CAD Brother    Colon cancer Neg Hx     Past Surgical History:  Procedure Laterality Date   ABDOMINAL HYSTERECTOMY     CHOLECYSTECTOMY     COLONOSCOPY WITH PROPOFOL  N/A 10/06/2020   Surgeon: Cindie Dunnings K, DO; nonbleeding internal hemorrhoids, 5 mm tubular adenoma removed, 3 mm hyperplastic polyp removed, otherwise normal exam.  Recommended 5-year surveillance.   ORIF FEMUR FRACTURE Left 11/30/2016  Procedure: OPEN REDUCTION INTERNAL FIXATION (ORIF) DISTAL FEMUR FRACTURE;  Surgeon: Fidel Rogue, MD;  Location: [facility] OR;  Service: Orthopedics;  Laterality: Left;   POLYPECTOMY  10/06/2020   Procedure: POLYPECTOMY INTESTINAL;  Surgeon: Cindie Carlin POUR, DO;  Location: [facility];  Service: Endoscopy;;   stroke     TONSILLECTOMY     Social History   Occupational History   Not on file  Tobacco Use   Smoking status: Former    Current packs/day: 0.00    Average packs/day: 3.0 packs/day for 20.0 years (60.0 ttl pk-yrs)    Types: Cigarettes    Start date: 09/26/1980    Quit date: 09/26/2000    Years since quitting: 23.2   Smokeless tobacco: Never  Vaping Use   Vaping status: Never Used  Substance and Sexual Activity   Alcohol use: No   Drug use: No   Sexual activity: Not Currently

## 2023-12-14 ENCOUNTER — Encounter: Payer: Self-pay | Admitting: *Deleted

## 2023-12-14 NOTE — Telephone Encounter (Signed)
 Pt has been scheduled for 12/30/23. Instructions mailed.

## 2023-12-16 ENCOUNTER — Ambulatory Visit: Admitting: Urology

## 2023-12-16 DIAGNOSIS — N390 Urinary tract infection, site not specified: Secondary | ICD-10-CM

## 2023-12-20 ENCOUNTER — Other Ambulatory Visit: Payer: Self-pay | Admitting: Physician Assistant

## 2023-12-20 DIAGNOSIS — M810 Age-related osteoporosis without current pathological fracture: Secondary | ICD-10-CM

## 2023-12-20 MED ORDER — DENOSUMAB 60 MG/ML ~~LOC~~ SOSY: 60.0000 mg | PREFILLED_SYRINGE | Freq: Once | SUBCUTANEOUS | Status: AC

## 2023-12-21 ENCOUNTER — Inpatient Hospital Stay: Admitting: Neurology

## 2023-12-23 ENCOUNTER — Telehealth: Payer: Self-pay | Admitting: Radiology

## 2023-12-23 ENCOUNTER — Other Ambulatory Visit: Payer: Self-pay | Admitting: Radiology

## 2023-12-23 DIAGNOSIS — M810 Age-related osteoporosis without current pathological fracture: Secondary | ICD-10-CM

## 2023-12-23 MED ORDER — DENOSUMAB-BBDZ 60 MG/ML ~~LOC~~ SOSY: 60.0000 mg | PREFILLED_SYRINGE | Freq: Once | SUBCUTANEOUS | Status: AC

## 2023-12-23 NOTE — Telephone Encounter (Signed)
 I called patient and I have her sched to come in Thurs 12/29/23 to receive her first Jubbonti injection.  She mentioned lab work that she needed to get for you, from her primary care.  Can you tell me if we need something done prior to injection?  Or need to review labs first?

## 2023-12-26 ENCOUNTER — Encounter: Payer: Self-pay | Admitting: Radiology

## 2023-12-26 ENCOUNTER — Encounter: Payer: Self-pay | Admitting: *Deleted

## 2023-12-26 NOTE — Telephone Encounter (Signed)
 Pt called and needed to reschedule her procedure. Pt has been rescheduled to Friday 01/13/24. Updated instructions mailed to pt.

## 2023-12-29 ENCOUNTER — Ambulatory Visit: Admitting: Physician Assistant

## 2023-12-29 ENCOUNTER — Encounter (HOSPITAL_COMMUNITY)

## 2024-01-04 ENCOUNTER — Ambulatory Visit: Admitting: Physician Assistant

## 2024-01-11 ENCOUNTER — Encounter (HOSPITAL_COMMUNITY)
Admission: RE | Admit: 2024-01-11 | Discharge: 2024-01-11 | Disposition: A | Discharge status: Home / Self Care | Source: Ambulatory Visit | Attending: Internal Medicine | Admitting: Internal Medicine

## 2024-01-13 ENCOUNTER — Encounter (HOSPITAL_COMMUNITY)
Admission: RE | Disposition: A | Discharge status: Home / Self Care | Payer: Self-pay | Source: Home / Self Care | Attending: Internal Medicine

## 2024-01-13 ENCOUNTER — Ambulatory Visit (HOSPITAL_COMMUNITY): Admitting: Anesthesiology

## 2024-01-13 ENCOUNTER — Encounter (HOSPITAL_COMMUNITY): Payer: Self-pay | Admitting: Internal Medicine

## 2024-01-13 ENCOUNTER — Ambulatory Visit (HOSPITAL_COMMUNITY)
Admission: RE | Admit: 2024-01-13 | Discharge: 2024-01-13 | Disposition: A | Discharge status: Home / Self Care | Attending: Internal Medicine | Admitting: Internal Medicine

## 2024-01-13 DIAGNOSIS — E66813 Obesity, class 3: Secondary | ICD-10-CM | POA: Insufficient documentation

## 2024-01-13 DIAGNOSIS — K5710 Diverticulosis of small intestine without perforation or abscess without bleeding: Secondary | ICD-10-CM

## 2024-01-13 DIAGNOSIS — I129 Hypertensive chronic kidney disease with stage 1 through stage 4 chronic kidney disease, or unspecified chronic kidney disease: Secondary | ICD-10-CM | POA: Diagnosis not present

## 2024-01-13 DIAGNOSIS — N1831 Chronic kidney disease, stage 3a: Secondary | ICD-10-CM | POA: Diagnosis not present

## 2024-01-13 DIAGNOSIS — E1122 Type 2 diabetes mellitus with diabetic chronic kidney disease: Secondary | ICD-10-CM | POA: Diagnosis not present

## 2024-01-13 DIAGNOSIS — Z8673 Personal history of transient ischemic attack (TIA), and cerebral infarction without residual deficits: Secondary | ICD-10-CM | POA: Insufficient documentation

## 2024-01-13 DIAGNOSIS — J449 Chronic obstructive pulmonary disease, unspecified: Secondary | ICD-10-CM | POA: Diagnosis not present

## 2024-01-13 DIAGNOSIS — E1140 Type 2 diabetes mellitus with diabetic neuropathy, unspecified: Secondary | ICD-10-CM | POA: Diagnosis not present

## 2024-01-13 DIAGNOSIS — Z794 Long term (current) use of insulin: Secondary | ICD-10-CM | POA: Diagnosis not present

## 2024-01-13 DIAGNOSIS — R1310 Dysphagia, unspecified: Secondary | ICD-10-CM

## 2024-01-13 DIAGNOSIS — K2960 Other gastritis without bleeding: Secondary | ICD-10-CM | POA: Diagnosis not present

## 2024-01-13 DIAGNOSIS — Z87891 Personal history of nicotine dependence: Secondary | ICD-10-CM | POA: Diagnosis not present

## 2024-01-13 DIAGNOSIS — Z79899 Other long term (current) drug therapy: Secondary | ICD-10-CM | POA: Diagnosis not present

## 2024-01-13 DIAGNOSIS — I4891 Unspecified atrial fibrillation: Secondary | ICD-10-CM | POA: Insufficient documentation

## 2024-01-13 DIAGNOSIS — Z7984 Long term (current) use of oral hypoglycemic drugs: Secondary | ICD-10-CM | POA: Insufficient documentation

## 2024-01-13 DIAGNOSIS — Z6838 Body mass index (BMI) 38.0-38.9, adult: Secondary | ICD-10-CM | POA: Insufficient documentation

## 2024-01-13 DIAGNOSIS — I10 Essential (primary) hypertension: Secondary | ICD-10-CM

## 2024-01-13 DIAGNOSIS — K219 Gastro-esophageal reflux disease without esophagitis: Secondary | ICD-10-CM | POA: Diagnosis not present

## 2024-01-13 HISTORY — PX: ESOPHAGEAL DILATION: SHX303

## 2024-01-13 HISTORY — PX: ESOPHAGOGASTRODUODENOSCOPY: SHX5428

## 2024-01-13 LAB — GLUCOSE, CAPILLARY: Glucose-Capillary: 120 mg/dL — ABNORMAL HIGH (ref 70–99)

## 2024-01-13 SURGERY — EGD (ESOPHAGOGASTRODUODENOSCOPY)
Anesthesia: General

## 2024-01-13 MED ORDER — PROPOFOL 10 MG/ML IV BOLUS
INTRAVENOUS | Status: DC | PRN
  Administered 2024-01-13: 100 mg via INTRAVENOUS
  Administered 2024-01-13: 150 ug/kg/min via INTRAVENOUS

## 2024-01-13 MED ORDER — LACTATED RINGERS IV SOLN: INTRAVENOUS | Status: DC | PRN

## 2024-01-13 MED ORDER — LACTATED RINGERS IV SOLN: INTRAVENOUS | Status: DC

## 2024-01-13 MED ORDER — LIDOCAINE HCL (CARDIAC) PF 100 MG/5ML IV SOSY
PREFILLED_SYRINGE | INTRAVENOUS | Status: DC | PRN
  Administered 2024-01-13: 50 mg via INTRAVENOUS

## 2024-01-13 NOTE — Discharge Instructions (Addendum)
 EGD Discharge instructions Please read the instructions outlined below and refer to this sheet in the next few weeks. These discharge instructions provide you with general information on caring for yourself after you leave the hospital. Your doctor may also give you specific instructions. While your treatment has been planned according to the most current medical practices available, unavoidable complications occasionally occur. If you have any problems or questions after discharge, please call your doctor. ACTIVITY You may resume your regular activity but move at a slower pace for the next 24 hours.  Take frequent rest periods for the next 24 hours.  Walking will help expel (get rid of) the air and reduce the bloated feeling in your abdomen.  No driving for 24 hours (because of the anesthesia (medicine) used during the test).  You may shower.  Do not sign any important legal documents or operate any machinery for 24 hours (because of the anesthesia used during the test).  NUTRITION Drink plenty of fluids.  You may resume your normal diet.  Begin with a light meal and progress to your normal diet.  Avoid alcoholic beverages for 24 hours or as instructed by your caregiver.  MEDICATIONS You may resume your normal medications unless your caregiver tells you otherwise.  WHAT YOU CAN EXPECT TODAY You may experience abdominal discomfort such as a feeling of fullness or "gas" pains.  FOLLOW-UP Your doctor will discuss the results of your test with you.  SEEK IMMEDIATE MEDICAL ATTENTION IF ANY OF THE FOLLOWING OCCUR: Excessive nausea (feeling sick to your stomach) and/or vomiting.  Severe abdominal pain and distention (swelling).  Trouble swallowing.  Temperature over 101 F (37.8 C).  Rectal bleeding or vomiting of blood.   Your EGD revealed mild amount inflammation in your stomach.  I took biopsies of this to rule out infection with a bacteria called H. pylori.  Await pathology results, my  office will contact you.  Your esophagus was mildly tight, I did stretch it out today. Hopefully this helps with your symptoms.   Continue on pantoprazole .   Follow up in GI office in 2-3 months.    I hope you have a great rest of your week!  Carlin POUR. Cindie, D.O. Gastroenterology and Hepatology [facility]

## 2024-01-13 NOTE — Transfer of Care (Signed)
 Immediate Anesthesia Transfer of Care Note  Patient: [name]  Procedure(s) Performed: EGD (ESOPHAGOGASTRODUODENOSCOPY) DILATION, ESOPHAGUS  Patient Location: Short Stay  Anesthesia Type:General  Level of Consciousness: awake, alert , oriented, and patient cooperative  Airway & Oxygen  Therapy: Patient Spontanous Breathing and Patient connected to nasal cannula oxygen   Post-op Assessment: Report given to RN and Post -op Vital signs reviewed and stable  Post vital signs: Reviewed and stable  Last Vitals:  Vitals Value Taken Time  BP 124/54 01/13/24 11:47  Temp 37.1 C 01/13/24 11:47  Pulse 63 01/13/24 11:47  Resp 26 01/13/24 11:47  SpO2 94 % 01/13/24 11:47    Last Pain:  Vitals:   01/13/24 1147  TempSrc: Oral  PainSc: 0-No pain      Patients Stated Pain Goal: 8 (01/13/24 1121)  Complications: No notable events documented.

## 2024-01-13 NOTE — Op Note (Signed)
 [facility] Patient Name: [name] Procedure Date: 01/13/2024 11:25 AM MRN: [id number] Date of Birth: [birth year] Attending MD: Carlin POUR. Cindie , [place], [id number] CSN: [id number] Age: 73 Admit Type: Outpatient Procedure:                Upper GI endoscopy Indications:              Dysphagia, Heartburn Providers:                Carlin POUR. Cindie, DO, Tammy Vaught, RN, Leandrew Edelman RN, RN Referring MD:              Medicines:                See the Anesthesia note for documentation of the                            administered medications Complications:            No immediate complications. Estimated Blood Loss:     Estimated blood loss was minimal. Procedure:                Pre-Anesthesia Assessment:                           - The anesthesia plan was to use monitored                            anesthesia care (MAC).                           After obtaining informed consent, the endoscope was                            passed under direct vision. Throughout the                            procedure, the patient's blood pressure, pulse, and                            oxygen  saturations were monitored continuously. The                            [id number] ([id number])Leezm was introduced through the                            mouth, and advanced to the second part of duodenum.                            The upper GI endoscopy was accomplished without                            difficulty. The patient tolerated the procedure                            well. Scope In: 11:38:11 AM  Scope Out: 11:43:22 AM Total Procedure Duration: 0 hours 5 minutes 11 seconds  Findings:      The Z-line was regular and was found 39 cm from the incisors.      No endoscopic abnormality was evident in the esophagus to explain the       patient's complaint of dysphagia. Preparations were made for empiric       dilation. A TTS dilator was passed through the scope. Dilation with  an       18-19-20 mm balloon dilator was performed to 20 mm. Dilation was       performed with a mild resistance at 20 mm. Estimated blood loss was none.      Patchy mild inflammation was found in the gastric body and in the       gastric antrum. Biopsies were taken with a cold forceps for Helicobacter       pylori testing.      A medium non-bleeding diverticulum was found in the duodenal bulb.      The second portion of the duodenum was normal. Impression:               - Z-line regular, 39 cm from the incisors.                           - Gastritis. Biopsied.                           - Non-bleeding duodenal diverticulum.                           - Normal second portion of the duodenum. Moderate Sedation:      Per Anesthesia Care Recommendation:           - Patient has a contact number available for                            emergencies. The signs and symptoms of potential                            delayed complications were discussed with the                            patient. Return to normal activities tomorrow.                            Written discharge instructions were provided to the                            patient.                           - Resume previous diet.                           - Continue present medications.                           - Await pathology results.                           -  Repeat upper endoscopy PRN for retreatment.                           - Return to GI clinic in 3 months.                           - Use Protonix  (pantoprazole ) 40 mg PO daily. Procedure Code(s):        --- Professional ---                           [phone], Esophagogastroduodenoscopy, flexible,                            transoral; with biopsy, single or multiple Diagnosis Code(s):        --- Professional ---                           K29.70, Gastritis, unspecified, without bleeding                           R13.10, Dysphagia, unspecified                           R12,  Heartburn                           K57.10, Diverticulosis of small intestine without                            perforation or abscess without bleeding CPT copyright 2022 American Medical Association. All rights reserved. The codes documented in this report are preliminary and upon coder review may  be revised to meet current compliance requirements. Carlin POUR. Cindie, DO Carlin POUR. Cindie, DO 01/13/2024 11:46:46 AM This report has been signed electronically. Number of Addenda: 0

## 2024-01-13 NOTE — Anesthesia Preprocedure Evaluation (Addendum)
 Anesthesia Evaluation  Patient identified by MRN, date of birth, ID band Patient awake    Reviewed: Allergy & Precautions, NPO status , Patient's Chart, lab work & pertinent test results, reviewed documented beta blocker date and time   History of Anesthesia Complications (+) PONV and history of anesthetic complications  Airway Mallampati: II  TM Distance: >3 FB Neck ROM: Full    Dental  (+) Edentulous Upper, Edentulous Lower   Pulmonary shortness of breath and with exertion, COPD, former smoker Room air sat 90 to 92   Pulmonary exam normal breath sounds clear to auscultation       Cardiovascular Exercise Tolerance: Good hypertension, Pt. on medications and Pt. on home beta blockers Normal cardiovascular exam+ dysrhythmias Atrial Fibrillation  Rhythm:Regular Rate:Normal     Neuro/Psych  PSYCHIATRIC DISORDERS   Bipolar Disorder   neuropathy  Neuromuscular disease CVA, Residual Symptoms    GI/Hepatic Neg liver ROS,GERD  Medicated and Controlled,,  Endo/Other  diabetes, Well Controlled, Type 2, Insulin  Dependent  Class 3 obesity  Renal/GU CRFRenal diseaseStage 3a CKD     Musculoskeletal   Abdominal   Peds  Hematology   Anesthesia Other Findings   Reproductive/Obstetrics                              Anesthesia Physical Anesthesia Plan  ASA: 3  Anesthesia Plan: General   Post-op Pain Management: Minimal or no pain anticipated   Induction: Intravenous  PONV Risk Score and Plan: Propofol  infusion  Airway Management Planned: Nasal Cannula and Natural Airway  Additional Equipment: None  Intra-op Plan:   Post-operative Plan:   Informed Consent: I have reviewed the patients History and Physical, chart, labs and discussed the procedure including the risks, benefits and alternatives for the proposed anesthesia with the patient or authorized representative who has indicated his/her  understanding and acceptance.       Plan Discussed with: CRNA and Surgeon  Anesthesia Plan Comments:          Anesthesia Quick Evaluation

## 2024-01-13 NOTE — Anesthesia Postprocedure Evaluation (Signed)
 Anesthesia Post Note  Patient: [name]  Procedure(s) Performed: EGD (ESOPHAGOGASTRODUODENOSCOPY) DILATION, ESOPHAGUS  Patient location during evaluation: Phase II Anesthesia Type: General Level of consciousness: awake and alert Pain management: pain level controlled Vital Signs Assessment: post-procedure vital signs reviewed and stable Respiratory status: spontaneous breathing, nonlabored ventilation and respiratory function stable Cardiovascular status: stable Anesthetic complications: no   There were no known notable events for this encounter.   Last Vitals:  Vitals:   01/13/24 1121 01/13/24 1147  BP: (!) 179/71 (!) 124/54  Pulse:  63  Resp: (!) 57 (!) 26  Temp: 36.9 C 37.1 C  SpO2: 96% 94%    Last Pain:  Vitals:   01/13/24 1147  TempSrc: Oral  PainSc: 0-No pain                 Verania Salberg L Relda Agosto

## 2024-01-13 NOTE — H&P (Signed)
 Primary Care Physician:  Pllc, [facility] Primary Gastroenterologist:  Dr. Cindie  Pre-Procedure History & Physical: HPI:  [name] is a 73 y.o. female is here for an EGD with possible dilation due to history of dysphagia, GERD  Past Medical History:  Diagnosis Date   Bipolar 1 disorder (HCC)    Cancer (HCC)    Diabetes mellitus without complication (HCC)    Hypertension    PONV (postoperative nausea and vomiting)    Stroke (HCC) 08/2022    Past Surgical History:  Procedure Laterality Date   ABDOMINAL HYSTERECTOMY     CHOLECYSTECTOMY     COLONOSCOPY WITH PROPOFOL  N/A 10/06/2020   Surgeon: Cindie Carlin POUR, DO; nonbleeding internal hemorrhoids, 5 mm tubular adenoma removed, 3 mm hyperplastic polyp removed, otherwise normal exam.  Recommended 5-year surveillance.   ORIF FEMUR FRACTURE Left 11/30/2016   Procedure: OPEN REDUCTION INTERNAL FIXATION (ORIF) DISTAL FEMUR FRACTURE;  Surgeon: Fidel Rogue, MD;  Location: [facility] OR;  Service: Orthopedics;  Laterality: Left;   POLYPECTOMY  10/06/2020   Procedure: POLYPECTOMY INTESTINAL;  Surgeon: Cindie Carlin POUR, DO;  Location: [facility] ENDO SUITE;  Service: Endoscopy;;   stroke     TONSILLECTOMY      Prior to Admission medications   Medication Sig Start Date End Date Taking? Authorizing Provider  acetaminophen  (TYLENOL ) 650 MG CR tablet Take 1,300 mg by mouth every 8 (eight) hours as needed for pain. BID   Yes [provider]  albuterol  (VENTOLIN  HFA) 108 (90 Base) MCG/ACT inhaler Inhale 2 puffs into the lungs every 8 (eight) hours as needed for shortness of breath or wheezing. 08/10/22  Yes Emokpae, Courage, MD  calcium  carbonate (OSCAL) 1500 (600 Ca) MG TABS tablet Take 1,500 mg by mouth 2 (two) times daily with a meal.   Yes [provider]  dicyclomine  (BENTYL ) 10 MG capsule Take 1 capsule (10 mg total) by mouth 3 (three) times daily as needed (abdominal cramping or diarrhea. Hold in setting of constipation.).  09/21/22  Yes Shirlean Therisa ORN, NP  diltiazem  (CARDIZEM  CD) 120 MG 24 hr capsule Take 1 capsule (120 mg total) by mouth daily. 11/14/23  Yes Mallipeddi, Vishnu P, MD  famotidine  (PEPCID ) 20 MG tablet One after supper 04/26/22  Yes Darlean Ozell NOVAK, MD  gabapentin  (NEURONTIN ) 300 MG capsule Take 300 mg by mouth 3 (three) times daily. 07/05/22  Yes [provider]  Insulin  Pen Needle 31G X 5 MM MISC by Does not apply route.   Yes [provider]  LINZESS  290 MCG CAPS capsule TAKE 1 CAPSULE BY MOUTH ONCE DAILY BEFORE BREAKFAST 05/02/23  Yes Shirlean Therisa ORN, NP  lubiprostone  (AMITIZA ) 24 MCG capsule Take 1 capsule (24 mcg total) by mouth 2 (two) times daily with a meal. 10/20/23  Yes Shirlean Therisa ORN, NP  meclizine (ANTIVERT) 25 MG tablet Take 25 mg by mouth 2 (two) times daily as needed for dizziness. 03/16/18  Yes [provider]  metFORMIN (GLUCOPHAGE) 500 MG tablet Take 500 mg by mouth 2 (two) times daily with a meal.   Yes [provider]  metoprolol  tartrate (LOPRESSOR ) 25 MG tablet Take 1 tablet (25 mg total) by mouth 2 (two) times daily. 11/14/23  Yes Mallipeddi, Vishnu P, MD  montelukast  (SINGULAIR ) 10 MG tablet Take 10 mg by mouth at bedtime.   Yes [provider]  nitrofurantoin (MACRODANTIN) 50 MG capsule Take 50 mg by mouth daily. 03/23/23  Yes [provider]  OVER THE COUNTER MEDICATION  One touch Verio Reflect glucose machine.  One touch Verio Reflect glucose test strips 1-2 daily  One touch delica plus Lancets one lancet to check glucose bid.   Yes [provider]  pantoprazole  (PROTONIX ) 40 MG tablet Take 40 mg by mouth daily. 11/28/17  Yes [provider]  promethazine  (PHENERGAN ) 25 MG tablet Take 1 tablet by mouth every 12 (twelve) hours as needed.   Yes [provider]  sodium chloride  (OCEAN) 0.65 % SOLN nasal spray Place 1 spray into both nostrils as needed for congestion.   Yes [provider]  tizanidine  (ZANAFLEX) 2 MG capsule SMARTSIG:1 Capsule(s) By Mouth Every 12 Hours PRN 03/30/23  Yes [provider]  TRESIBA FLEXTOUCH 100 UNIT/ML FlexTouch Pen SMARTSIG:15 Unit(s) SUB-Q Every Night 03/30/23  Yes [provider]  VASCEPA 1 g capsule Take 2 g by mouth 2 (two) times daily. 06/17/20  Yes [provider]  apixaban  (ELIQUIS ) 5 MG TABS tablet Take 1 tablet (5 mg total) by mouth 2 (two) times daily. 11/29/22   Maree, Pratik D, DO  atorvastatin  (LIPITOR) 40 MG tablet Take 1 tablet (40 mg total) by mouth daily. 08/10/22 10/20/23  Pearlean Manus, MD  nitroGLYCERIN  (NITROSTAT ) 0.4 MG SL tablet Place 1 tablet (0.4 mg total) under the tongue every 5 (five) minutes x 3 doses as needed for chest pain. 04/27/18   Strader, Laymon HERO, PA-C    Allergies as of 12/14/2023 - Review Complete 12/12/2023  Allergen Reaction Noted   Ciprofloxacin Diarrhea and Nausea And Vomiting 09/30/2020   Latex Itching 11/30/2016   Sulfa antibiotics Itching and Rash 11/29/2016    Family History  Problem Relation Age of Onset   CAD Father    CAD Brother    Colon cancer Neg Hx     Social History   Socioeconomic History   Marital status: Married    Spouse name: Not on file   Number of children: Not on file   Years of education: Not on file   Highest education level: Not on file  Occupational History   Not on file  Tobacco Use   Smoking status: Former    Current packs/day: 0.00    Average packs/day: 3.0 packs/day for 20.0 years (60.0 ttl pk-yrs)    Types: Cigarettes    Start date: 09/26/1980    Quit date: 09/26/2000    Years since quitting: 23.3   Smokeless tobacco: Never  Vaping Use   Vaping status: Never Used  Substance and Sexual Activity   Alcohol use: No   Drug use: No   Sexual activity: Not Currently  Other Topics Concern   Not on file  Social History Narrative   Not on file   Social Drivers of Health   Financial Resource Strain: Not on file  Food Insecurity: No Food Insecurity  (11/25/2022)   Hunger Vital Sign    Worried About Running Out of Food in the Last Year: Never true    Ran Out of Food in the Last Year: Never true  Transportation Needs: No Transportation Needs (11/25/2022)   PRAPARE - Administrator, Civil Service (Medical): No    Lack of Transportation (Non-Medical): No  Physical Activity: Not on file  Stress: Not on file  Social Connections: Not on file  Intimate Partner Violence: Not At Risk (11/25/2022)   Humiliation, Afraid, Rape, and Kick questionnaire    Fear of Current or Ex-Partner: No    Emotionally Abused: No    Physically Abused:  No    Sexually Abused: No    Review of Systems: General: Negative for fever, chills, fatigue, weakness. Eyes: Negative for vision changes.  ENT: Negative for hoarseness, difficulty swallowing , nasal congestion. CV: Negative for chest pain, angina, palpitations, dyspnea on exertion, peripheral edema.  Respiratory: Negative for dyspnea at rest, dyspnea on exertion, cough, sputum, wheezing.  GI: See history of present illness. GU:  Negative for dysuria, hematuria, urinary incontinence, urinary frequency, nocturnal urination.  MS: Negative for joint pain, low back pain.  Derm: Negative for rash or itching.  Neuro: Negative for weakness, abnormal sensation, seizure, frequent headaches, memory loss, confusion.  Psych: Negative for anxiety, depression Endo: Negative for unusual weight change.  Heme: Negative for bruising or bleeding. Allergy: Negative for rash or hives.  Physical Exam: Vital signs in last 24 hours: Temp:  [98.5 F (36.9 C)] 98.5 F (36.9 C) (11/21 1121) Resp:  [57] 57 (11/21 1121) BP: (179)/(71) 179/71 (11/21 1121) SpO2:  [96 %] 96 % (11/21 1121)   General:   Alert,  Well-developed, well-nourished, pleasant and cooperative in NAD Head:  Normocephalic and atraumatic. Eyes:  Sclera clear, no icterus.   Conjunctiva pink. Ears:  Normal auditory acuity. Nose:  No deformity,  discharge,  or lesions. Msk:  Symmetrical without gross deformities. Normal posture. Extremities:  Without clubbing or edema. Neurologic:  Alert and  oriented x4;  grossly normal neurologically. Skin:  Intact without significant lesions or rashes. Psych:  Alert and cooperative. Normal mood and affect.   Impression/Plan: [name] is here for an EGD with possible dilation due to history of dysphagia, GERD  Risks, benefits, limitations, imponderables and alternatives regarding procedure have been reviewed with the patient. Questions have been answered. All parties agreeable.

## 2024-01-16 LAB — SURGICAL PATHOLOGY

## 2024-01-17 ENCOUNTER — Encounter (HOSPITAL_COMMUNITY): Payer: Self-pay | Admitting: Internal Medicine

## 2024-01-24 ENCOUNTER — Ambulatory Visit: Admitting: Gastroenterology

## 2024-01-26 ENCOUNTER — Telehealth: Payer: Self-pay | Admitting: Physician Assistant

## 2024-01-26 NOTE — Telephone Encounter (Signed)
 Pt called saying that she wanted to know if you had found anything out about a different injection that would be covered better by her insurance. Call back number is [phone]

## 2024-02-02 ENCOUNTER — Ambulatory Visit: Admitting: Orthopedic Surgery

## 2024-02-11 ENCOUNTER — Other Ambulatory Visit: Payer: Self-pay | Admitting: Gastroenterology

## 2024-03-01 ENCOUNTER — Ambulatory Visit: Admitting: Neurology

## 2024-03-01 ENCOUNTER — Telehealth: Payer: Self-pay | Admitting: Neurology

## 2024-03-01 ENCOUNTER — Encounter: Payer: Self-pay | Admitting: Neurology

## 2024-03-01 VITALS — BP 117/59 | HR 60 | Ht 62.0 in | Wt 216.2 lb

## 2024-03-01 DIAGNOSIS — R42 Dizziness and giddiness: Secondary | ICD-10-CM | POA: Diagnosis not present

## 2024-03-01 DIAGNOSIS — R269 Unspecified abnormalities of gait and mobility: Secondary | ICD-10-CM

## 2024-03-01 DIAGNOSIS — Z8673 Personal history of transient ischemic attack (TIA), and cerebral infarction without residual deficits: Secondary | ICD-10-CM | POA: Diagnosis not present

## 2024-03-01 DIAGNOSIS — R531 Weakness: Secondary | ICD-10-CM | POA: Diagnosis not present

## 2024-03-01 NOTE — Progress Notes (Unsigned)
 " [facility] [address] [place]. [place] [address] [phone]       OFFICE CONSULT NOTE  [name] Date of Birth:  Jul 20, 1950 Medical Record Number:  [id number]   Referring MD: Dr. Rendall  Reason for Referral: Stroke  HPI: [name] is a pleasant 74 year old Caucasian lady seen today for initial office consultation visit for stroke.  History is obtained from the patient and review of electronic medical records.  I personally reviewed pertinent available imaging films in PACS.  She has past medical history of diabetes, hypertension, bipolar disorder.  She presented on 08/10/2022 for evaluation for sudden onset of left facial droop and left-sided weakness.  She was drinking water  when it started coming from the left side of the mouth.  She was also dropping things from her left hand and left (as well as numbness.  Symptoms did not improve over the weekend hence she came to the hospital for evaluation 4 days later.  She had lower facial weakness on the left along with dysarthria and left-sided sensory loss however MRI scan of the brain surprisingly was negative for acute stroke.  CT angiogram of the brain and neck showed no emergent large vessel stenosis and was normal..  LDL cholesterol was 59 mg percent.  Hemoglobin A1c was 6.4.  Echocardiogram showed EF 65 to 70%.  Left atrial size was normal.  Outpatient 30-day heart monitoring was negative for paroxysmal A-fib.  Patient was discharged aspirin  and Plavix  for 3 weeks followed by Plavix  alone.  She states she still has some mild residual left-sided weakness but she has recovered a lot.  She subsequently had repeat outpatient cardiac monitoring which documented A-fib and she has since been switched to Eliquis .  She is tolerating it well with minor bruising and a few episodes of nasal bleeding.  Patient still has left-sided weakness in the shoulder as well as the left leg.  She did have a hip replacement surgery in 2018  and is weakness in the left leg..  When she is tired she still droops the left side of the face.  Patient also has chronic dizziness which is mostly intermittent.  This is transient when she is sits up or has sudden movement of her neck.  She does admit to some ringing sound in her ears as well as decreased hearing.  He has not tried any vestibular stabilization exercises.  She does have poor balance and has to walk slowly and carefully and uses a cane.  Fortunately she has had no recent falls.  ROS:   14 system review of systems is positive for dizziness, imbalance, weakness, bruising, nasal bleeding all other systems negative  PMH:  Past Medical History:  Diagnosis Date   Bipolar 1 disorder (HCC)    Cancer (HCC)    Diabetes mellitus without complication (HCC)    Hypertension    PONV (postoperative nausea and vomiting)    Stroke (HCC) 08/2022    Social History:  Social History   Socioeconomic History   Marital status: Married    Spouse name: Not on file   Number of children: Not on file   Years of education: Not on file   Highest education level: Not on file  Occupational History   Not on file  Tobacco Use   Smoking status: Former    Current packs/day: 0.00    Average packs/day: 3.0 packs/day for 20.0 years (60.0 ttl pk-yrs)    Types: Cigarettes    Start date: 09/26/1980  Quit date: 09/26/2000    Years since quitting: 23.4   Smokeless tobacco: Never  Vaping Use   Vaping status: Never Used  Substance and Sexual Activity   Alcohol use: No   Drug use: No   Sexual activity: Not Currently  Other Topics Concern   Not on file  Social History Narrative   Not on file   Social Drivers of Health   Tobacco Use: Medium Risk (03/01/2024)   Patient History    Smoking Tobacco Use: Former    Smokeless Tobacco Use: Never    Passive Exposure: Not on Actuary Strain: Not on file  Food Insecurity: No Food Insecurity (11/25/2022)   Hunger Vital Sign    Worried About  Running Out of Food in the Last Year: Never true    Ran Out of Food in the Last Year: Never true  Transportation Needs: No Transportation Needs (11/25/2022)   PRAPARE - Administrator, Civil Service (Medical): No    Lack of Transportation (Non-Medical): No  Physical Activity: Not on file  Stress: Not on file  Social Connections: Not on file  Intimate Partner Violence: Not At Risk (11/25/2022)   Humiliation, Afraid, Rape, and Kick questionnaire    Fear of Current or Ex-Partner: No    Emotionally Abused: No    Physically Abused: No    Sexually Abused: No  Depression (PHQ2-9): Not on file  Alcohol Screen: Not on file  Housing: Low Risk (11/25/2022)   Housing    Last Housing Risk Score: 0  Utilities: Not At Risk (11/25/2022)   AHC Utilities    Threatened with loss of utilities: No  Health Literacy: Not on file    Medications:  Medications Ordered Prior to Encounter[1]  Allergies:  Allergies[2]  Physical Exam General: Obese elderly Caucasian lady seated, in no evident distress Head: head normocephalic and atraumatic.   Neck: supple with no carotid or supraclavicular bruits Cardiovascular: regular rate and rhythm, no murmurs Musculoskeletal: no deformity Skin:  no rash/petichiae Vascular:  Normal pulses all extremities  Neurologic Exam Mental Status: Awake and fully alert. Oriented to place and time. Recent and remote memory intact. Attention span, concentration and fund of knowledge appropriate. Mood and affect appropriate.  Cranial Nerves: Fundoscopic exam reveals sharp disc margins. Pupils equal, briskly reactive to light. Extraocular movements full without nystagmus. Visual fields full to confrontation. Hearing intact. Facial sensation intact.  Trace left lower facial weakness.  Tongue, palate moves normally and symmetrically.  Motor: Normal bulk and tone. Normal strength in all tested extremity muscles.  Trace weakness of left hip mostly due to pain.  Diminished fine  finger movements of the left hand bedside of left upper extremity. Sensory.: intact to touch , pinprick , position and vibratory sensation.  Coordination: Rapid alternating movements normal in all extremities. Finger-to-nose and heel-to-shin performed accurately bilaterally.  Headshaking produces subjective dizziness but no objective nystagmus.  Fukuda stepping test did not. Gait and Station: Arises from chair without difficulty. Stance is broad-based. Gait is unsteady and uses a cane.  Favors left hip while walking.  Unsteady while standing on either foot or on a narrow base  reflexes: 1+ and symmetric. Toes downgoing.      ASSESSMENT: 74 year old Caucasian lady with remote history of likely small right subcortical infarct not visualized on MRI in June 2024 as it was done 4 days after the onset of the stroke with mild residual left-sided weakness .Patient also complains of chronic dizziness and gait imbalance  which is likely multifactorial due to combination of arthritis, peripheral vestibular dysfunction and cerebrovascular disease     PLAN:I had a long discussion with the patient regarding her remote history of stroke and residual left-sided weakness and some imbalance and I recommend  further evaluation with MRI scan of the brain, MR angiogram of the brain and neck, profile, hemoglobin A1c.  Also referred to physical therapy for vestibular stabilization exercises.  Continue Eliquis  for stroke prevention and maintain strict control of hypertension with blood pressure goal below 130/90, lipids with LDL cholesterol goal below 70 mg percent, hemoglobin A1c goal below 6.5%.  He was advised to use a cane at all times and we discussed fall prevention precautions.  Return for follow-up in the future in 6 months or call earlier if necessary.   I personally spent a total of 50 minutes in the care of the patient today including getting/reviewing separately obtained history, performing a medically appropriate  exam/evaluation, counseling and educating, placing orders, referring and communicating with other health care professionals, documenting clinical information in the EHR, independently interpreting results, and coordinating care.       Eather Popp, MD  Note: This document was prepared with digital dictation and possible smart phrase technology. Any transcriptional errors that result from this process are unintentional.      [1]  Current Outpatient Medications on File Prior to Visit  Medication Sig Dispense Refill   acetaminophen  (TYLENOL ) 650 MG CR tablet Take 1,300 mg by mouth every 8 (eight) hours as needed for pain. BID     albuterol  (VENTOLIN  HFA) 108 (90 Base) MCG/ACT inhaler Inhale 2 puffs into the lungs every 8 (eight) hours as needed for shortness of breath or wheezing. 18 g 3   apixaban  (ELIQUIS ) 5 MG TABS tablet Take 1 tablet (5 mg total) by mouth 2 (two) times daily. 60 tablet 2   atorvastatin  (LIPITOR) 40 MG tablet Take 1 tablet (40 mg total) by mouth daily. 30 tablet 11   calcium  carbonate (OSCAL) 1500 (600 Ca) MG TABS tablet Take 1,500 mg by mouth 2 (two) times daily with a meal.     dicyclomine  (BENTYL ) 10 MG capsule Take 1 capsule (10 mg total) by mouth 3 (three) times daily as needed (abdominal cramping or diarrhea. Hold in setting of constipation.). 120 capsule 1   diltiazem  (CARDIZEM  CD) 120 MG 24 hr capsule Take 1 capsule (120 mg total) by mouth daily. 30 capsule 5   famotidine  (PEPCID ) 20 MG tablet One after supper 30 tablet 3   gabapentin  (NEURONTIN ) 300 MG capsule Take 300 mg by mouth 3 (three) times daily.     LANTUS  SOLOSTAR 100 UNIT/ML Solostar Pen Inject 12 Units into the skin at bedtime.     lubiprostone  (AMITIZA ) 24 MCG capsule TAKE 1 CAPSULE BY MOUTH TWICE DAILY WITH MEALS 60 capsule 0   meclizine (ANTIVERT) 25 MG tablet Take 25 mg by mouth 2 (two) times daily as needed for dizziness.     metFORMIN (GLUCOPHAGE) 500 MG tablet Take 500 mg by mouth 2 (two) times  daily with a meal.     metoprolol  tartrate (LOPRESSOR ) 25 MG tablet Take 1 tablet (25 mg total) by mouth 2 (two) times daily. 60 tablet 5   montelukast  (SINGULAIR ) 10 MG tablet Take 10 mg by mouth at bedtime.     nitrofurantoin (MACRODANTIN) 50 MG capsule Take 50 mg by mouth daily.     nitroGLYCERIN  (NITROSTAT ) 0.4 MG SL tablet Place 1 tablet (0.4 mg total) under  the tongue every 5 (five) minutes x 3 doses as needed for chest pain. 25 tablet 2   OVER THE COUNTER MEDICATION One touch Verio Reflect glucose machine.  One touch Verio Reflect glucose test strips 1-2 daily  One touch delica plus Lancets one lancet to check glucose bid.     pantoprazole  (PROTONIX ) 40 MG tablet Take 40 mg by mouth daily.  3   promethazine  (PHENERGAN ) 25 MG tablet Take 1 tablet by mouth every 12 (twelve) hours as needed.     sodium chloride  (OCEAN) 0.65 % SOLN nasal spray Place 1 spray into both nostrils as needed for congestion.     tizanidine (ZANAFLEX) 2 MG capsule SMARTSIG:1 Capsule(s) By Mouth Every 12 Hours PRN (Patient taking differently: Take 5 mg by mouth.)     VASCEPA 1 g capsule Take 2 g by mouth 2 (two) times daily.     Insulin  Pen Needle 31G X 5 MM MISC by Does not apply route. (Patient not taking: Reported on 03/01/2024)     LINZESS  290 MCG CAPS capsule TAKE 1 CAPSULE BY MOUTH ONCE DAILY BEFORE BREAKFAST (Patient not taking: Reported on 03/01/2024) 90 capsule 0   TRESIBA FLEXTOUCH 100 UNIT/ML FlexTouch Pen SMARTSIG:15 Unit(s) SUB-Q Every Night (Patient not taking: Reported on 03/01/2024)     Current Facility-Administered Medications on File Prior to Visit  Medication Dose Route Frequency Provider Last Rate Last Admin   denosumab  (PROLIA ) injection 60 mg  60 mg Subcutaneous Once Persons, Ronal Dragon, PA       denosumab -bbdz (JUBBONTI ) injection 60 mg  60 mg Subcutaneous Once Persons, Ronal Dragon, PA      [2]  Allergies Allergen Reactions   Ciprofloxacin Diarrhea and Nausea And Vomiting    stomach pain   Latex  Itching   Sulfa Antibiotics Itching and Rash   "

## 2024-03-01 NOTE — Patient Instructions (Addendum)
 I had a long discussion with the patient regarding her remote history of stroke and residual left-sided weakness and some imbalance and I recommend  further evaluation with MRI scan of the brain, MR angiogram of the brain and neck, profile, hemoglobin A1c.  Also referred to physical therapy for vestibular stabilization exercises.  Continue Eliquis  for stroke prevention and maintain strict control of hypertension with blood pressure goal below 130/90, lipids with LDL cholesterol goal below 70 mg percent, hemoglobin A1c goal below 6.5%.  He was advised to use a cane at all times and we discussed fall prevention precautions.  Return for follow-up in the future in 6 months or call earlier if necessary.  Fall Prevention in the Home, Adult Falls can cause injuries and can happen to people of all ages. There are many things you can do to make your home safer and to help prevent falls. What actions can I take to prevent falls? General information Use good lighting in all rooms. Make sure to: Replace any light bulbs that burn out. Turn on the lights in dark areas and use night-lights. Keep items that you use often in easy-to-reach places. Lower the shelves around your home if needed. Move furniture so that there are clear paths around it. Do not use throw rugs or other things on the floor that can make you trip. If any of your floors are uneven, fix them. Add color or contrast paint or tape to clearly mark and help you see: Grab bars or handrails. First and last steps of staircases. Where the edge of each step is. If you use a ladder or stepladder: Make sure that it is fully opened. Do not climb a closed ladder. Make sure the sides of the ladder are locked in place. Have someone hold the ladder while you use it. Know where your pets are as you move through your home. What can I do in the bathroom?     Keep the floor dry. Clean up any water  on the floor right away. Remove soap buildup in the bathtub  or shower. Buildup makes bathtubs and showers slippery. Use non-skid mats or decals on the floor of the bathtub or shower. Attach bath mats securely with double-sided, non-slip rug tape. If you need to sit down in the shower, use a non-slip stool. Install grab bars by the toilet and in the bathtub and shower. Do not use towel bars as grab bars. What can I do in the bedroom? Make sure that you have a light by your bed that is easy to reach. Do not use any sheets or blankets on your bed that hang to the floor. Have a firm chair or bench with side arms that you can use for support when you get dressed. What can I do in the kitchen? Clean up any spills right away. If you need to reach something above you, use a step stool with a grab bar. Keep electrical cords out of the way. Do not use floor polish or wax that makes floors slippery. What can I do with my stairs? Do not leave anything on the stairs. Make sure that you have a light switch at the top and the bottom of the stairs. Make sure that there are handrails on both sides of the stairs. Fix handrails that are broken or loose. Install non-slip stair treads on all your stairs if they do not have carpet. Avoid having throw rugs at the top or bottom of the stairs. Choose a carpet that  does not hide the edge of the steps on the stairs. Make sure that the carpet is firmly attached to the stairs. Fix carpet that is loose or worn. What can I do on the outside of my home? Use bright outdoor lighting. Fix the edges of walkways and driveways and fix any cracks. Clear paths of anything that can make you trip, such as tools or rocks. Add color or contrast paint or tape to clearly mark and help you see anything that might make you trip as you walk through a door, such as a raised step or threshold. Trim any bushes or trees on paths to your home. Check to see if handrails are loose or broken and that both sides of all steps have handrails. Install  guardrails along the edges of any raised decks and porches. Have leaves, snow, or ice cleared regularly. Use sand, salt, or ice melter on paths if you live where there is ice and snow during the winter. Clean up any spills in your garage right away. This includes grease or oil spills. What other actions can I take? Review your medicines with your doctor. Some medicines can cause dizziness or changes in blood pressure, which increase your risk of falling. Wear shoes that: Have a low heel. Do not wear high heels. Have rubber bottoms and are closed at the toe. Feel good on your feet and fit well. Use tools that help you move around if needed. These include: Canes. Walkers. Scooters. Crutches. Ask your doctor what else you can do to help prevent falls. This may include seeing a physical therapist to learn to do exercises to move better and get stronger. Where to find more information Centers for Disease Control and Prevention, STEADI: tonerpromos.no [employer] on Aging: baseringtones.pl National Institute on Aging: baseringtones.pl Contact a doctor if: You are afraid of falling at home. You feel weak, drowsy, or dizzy at home. You fall at home. Get help right away if you: Lose consciousness or have trouble moving after a fall. Have a fall that causes a head injury. These symptoms may be an emergency. Get help right away. Call 911. Do not wait to see if the symptoms will go away. Do not drive yourself to the hospital. This information is not intended to replace advice given to you by your health care provider. Make sure you discuss any questions you have with your health care provider. Document Revised: 10/12/2021 Document Reviewed: 10/12/2021 Elsevier Patient Education  2024 [employer].

## 2024-03-01 NOTE — Telephone Encounter (Signed)
 no auth required sent to [facility] [phone]

## 2024-03-02 LAB — LIPID PANEL
Chol/HDL Ratio: 2 ratio (ref 0.0–4.4)
Cholesterol, Total: 134 mg/dL (ref 100–199)
HDL: 68 mg/dL
LDL Chol Calc (NIH): 46 mg/dL (ref 0–99)
Triglycerides: 114 mg/dL (ref 0–149)
VLDL Cholesterol Cal: 20 mg/dL (ref 5–40)

## 2024-03-02 LAB — HEMOGLOBIN A1C
Est. average glucose Bld gHb Est-mCnc: 111 mg/dL
Hgb A1c MFr Bld: 5.5 % (ref 4.8–5.6)

## 2024-03-08 ENCOUNTER — Ambulatory Visit: Payer: Self-pay | Admitting: Neurology

## 2024-03-11 NOTE — Progress Notes (Unsigned)
 "  Chief Complaint: Frequent UTIs   History of Present Illness:  74 yo female presents for E/M of recurring urinary tract  infections.   Past Medical History:  Past Medical History:  Diagnosis Date   Bipolar 1 disorder (HCC)    Cancer (HCC)    Diabetes mellitus without complication (HCC)    Hypertension    PONV (postoperative nausea and vomiting)    Stroke (HCC) 08/2022    Past Surgical History:  Past Surgical History:  Procedure Laterality Date   ABDOMINAL HYSTERECTOMY     CHOLECYSTECTOMY     COLONOSCOPY WITH PROPOFOL  N/A 10/06/2020   Surgeon: Cindie Carlin POUR, DO; nonbleeding internal hemorrhoids, 5 mm tubular adenoma removed, 3 mm hyperplastic polyp removed, otherwise normal exam.  Recommended 5-year surveillance.   ESOPHAGEAL DILATION N/A 01/13/2024   Procedure: DILATION, ESOPHAGUS;  Surgeon: Cindie Carlin POUR, DO;  Location: [facility];  Service: Endoscopy;  Laterality: N/A;   ESOPHAGOGASTRODUODENOSCOPY N/A 01/13/2024   Procedure: EGD (ESOPHAGOGASTRODUODENOSCOPY);  Surgeon: Cindie Carlin POUR, DO;  Location: [facility];  Service: Endoscopy;  Laterality: N/A;  11:00 AM, ASA 3   ORIF FEMUR FRACTURE Left 11/30/2016   Procedure: OPEN REDUCTION INTERNAL FIXATION (ORIF) DISTAL FEMUR FRACTURE;  Surgeon: Fidel Rogue, MD;  Location: [facility];  Service: Orthopedics;  Laterality: Left;   POLYPECTOMY  10/06/2020   Procedure: POLYPECTOMY INTESTINAL;  Surgeon: Cindie Carlin POUR, DO;  Location: [facility];  Service: Endoscopy;;   stroke     TONSILLECTOMY      Allergies:  Allergies[1]  Family History:  Family History  Problem Relation Age of Onset   CAD Father    CAD Brother    Colon cancer Neg Hx     Social History:  Social History[2]  Review of symptoms:  Constitutional:  Negative for unexplained weight loss, night sweats, fever, chills ENT:  Negative for nose bleeds, sinus pain, painful swallowing CV:  Negative for chest pain, shortness of breath, exercise  intolerance, palpitations, loss of consciousness Resp:  Negative for cough, wheezing, shortness of breath GI:  Negative for nausea, vomiting, diarrhea, bloody stools GU:  Positives noted in HPI; otherwise negative for gross hematuria, dysuria, urinary incontinence Neuro:  Negative for seizures, poor balance, limb weakness, slurred speech Psych:  Negative for lack of energy, depression, anxiety Endocrine:  Negative for polydipsia, polyuria, symptoms of hypoglycemia (dizziness, hunger, sweating) Hematologic:  Negative for anemia, purpura, petechia, prolonged or excessive bleeding, use of anticoagulants  Allergic:  Negative for difficulty breathing or choking as a result of exposure to anything; no shellfish allergy; no allergic response (rash/itch) to materials, foods  Physical exam: There were no vitals taken for this visit. GENERAL APPEARANCE:  Well appearing, well developed, well nourished, NAD HEENT: Atraumatic, Normocephalic, oropharynx clear. NECK: Supple without lymphadenopathy or thyromegaly. LUNGS: Clear to auscultation bilaterally. HEART: Regular Rate and Rhythm without murmurs, gallops, or rubs. ABDOMEN: Soft, non-tender, No Masses. EXTREMITIES: Moves all extremities well.  Without clubbing, cyanosis, or edema. NEUROLOGIC:  Alert and oriented x 3, normal gait, CN II-XII grossly intact.  MENTAL STATUS:  Appropriate. BACK:  Non-tender to palpation.  No CVAT SKIN:  Warm, dry and intact.    Results: No results found for this or any previous visit (from the past 24 hours).  I have reviewed prior patient's records  I have reviewed referring/prior physicians records  I have reviewed urinalysis  I have reviewed prior urine cultures  I reviewed prior imaging studies  Assessment: No diagnosis found.  Plan: ***     [1]  Allergies Allergen Reactions   Ciprofloxacin Diarrhea and Nausea And Vomiting    stomach pain   Latex Itching   Sulfa Antibiotics Itching and Rash   [2]  Social History Tobacco Use   Smoking status: Former    Current packs/day: 0.00    Average packs/day: 3.0 packs/day for 20.0 years (60.0 ttl pk-yrs)    Types: Cigarettes    Start date: 09/26/1980    Quit date: 09/26/2000    Years since quitting: 23.4   Smokeless tobacco: Never  Vaping Use   Vaping status: Never Used  Substance Use Topics   Alcohol use: No   Drug use: No   "

## 2024-03-12 ENCOUNTER — Other Ambulatory Visit: Payer: Self-pay | Admitting: Gastroenterology

## 2024-03-13 ENCOUNTER — Ambulatory Visit
Admission: EM | Admit: 2024-03-13 | Discharge: 2024-03-13 | Disposition: A | Discharge status: Home / Self Care | Attending: Family Medicine | Admitting: Family Medicine

## 2024-03-13 ENCOUNTER — Ambulatory Visit: Admitting: Urology

## 2024-03-13 DIAGNOSIS — J0100 Acute maxillary sinusitis, unspecified: Secondary | ICD-10-CM | POA: Diagnosis not present

## 2024-03-13 DIAGNOSIS — N952 Postmenopausal atrophic vaginitis: Secondary | ICD-10-CM

## 2024-03-13 DIAGNOSIS — N390 Urinary tract infection, site not specified: Secondary | ICD-10-CM

## 2024-03-13 DIAGNOSIS — H66002 Acute suppurative otitis media without spontaneous rupture of ear drum, left ear: Secondary | ICD-10-CM | POA: Diagnosis not present

## 2024-03-13 DIAGNOSIS — R030 Elevated blood-pressure reading, without diagnosis of hypertension: Secondary | ICD-10-CM | POA: Diagnosis not present

## 2024-03-13 MED ORDER — AMOXICILLIN-POT CLAVULANATE 875-125 MG PO TABS: 1.0000 | ORAL_TABLET | Freq: Two times a day (BID) | ORAL | 0 refills | Status: AC

## 2024-03-13 MED ORDER — AZELASTINE HCL 0.1 % NA SOLN: 1.0000 | Freq: Two times a day (BID) | NASAL | 0 refills | Status: AC

## 2024-03-13 MED ORDER — PROMETHAZINE-DM 6.25-15 MG/5ML PO SYRP: 5.0000 mL | ORAL_SOLUTION | Freq: Four times a day (QID) | ORAL | 0 refills | Status: AC | PRN

## 2024-03-13 NOTE — ED Triage Notes (Signed)
 Pt reports sore throat, chest tightness and pressure SOB, and ear pain x 2 weeks.

## 2024-03-13 NOTE — ED Provider Notes (Signed)
 " [facility]    CSN: [id number] Arrival date & time: 03/13/24  1642      History   Chief Complaint No chief complaint on file.   HPI [name] is a 74 y.o. female.   Patient resenting today with 2-week history of sore throat, chest tightness, productive cough, nasal congestion, facial pain and pressure, left ear pain.  Denies chest pain, abdominal pain, vomiting, diarrhea.  Has noticed some wheezing over the past few days or worsening left ear pain.  So far trying numerous over-the-counter remedies including Coricidin HBP with minimal relief.  No known history of chronic pulmonary disease.    Past Medical History:  Diagnosis Date   Bipolar 1 disorder (HCC)    Cancer (HCC)    Diabetes mellitus without complication (HCC)    Hypertension    PONV (postoperative nausea and vomiting)    Stroke (HCC) 08/2022    Patient Active Problem List   Diagnosis Date Noted   Dysphagia 10/20/2023   Recurrent UTI 09/22/2023   Dizziness 05/12/2023   PAF (paroxysmal atrial fibrillation) (HCC) 12/15/2022   HLD (hyperlipidemia) 12/15/2022   Chest pain of uncertain etiology 12/15/2022   Atrial fibrillation with RVR (HCC) 11/25/2022   Stroke (HCC) 11/25/2022   Prolonged QT interval 11/25/2022   Diarrhea 11/25/2022   Left sided numbness 08/09/2022   Chronic kidney disease due to type 2 diabetes mellitus (HCC) 04/05/2022   Low back pain 04/05/2022   Constipation 10/07/2021   Right lower quadrant abdominal pain 10/07/2021   Nausea without vomiting 10/07/2021   Upper airway cough syndrome 05/07/2021   Age-related osteoporosis without current pathological fracture 11/13/2020   GERD (gastroesophageal reflux disease) 09/04/2020   Chronic obstructive pulmonary disease (HCC) 03/22/2020   Body mass index (BMI) 45.0-49.9, adult (HCC) 09/18/2019   Chronic kidney disease due to hypertension 09/18/2019   Polyneuropathy due to type 2 diabetes mellitus (HCC) 09/18/2019   Vitamin D  deficiency 09/18/2019   Stage 3a chronic kidney disease (HCC) 09/18/2019   Anxiety disorder 01/30/2019   Long term current use of insulin  (HCC) 01/30/2019   Chest pain 04/05/2018   Essential hypertension 11/30/2016   Bipolar 1 disorder (HCC) 11/30/2016   Diabetes mellitus without complication (HCC) 11/30/2016   Closed fracture of left distal femur (HCC) 11/30/2016   Other fracture of left femur, initial encounter for closed fracture (HCC) 11/29/2016   Morbid obesity (HCC) 11/29/2016    Past Surgical History:  Procedure Laterality Date   ABDOMINAL HYSTERECTOMY     CHOLECYSTECTOMY     COLONOSCOPY WITH PROPOFOL  N/A 10/06/2020   Surgeon: Cindie Dunnings K, DO; nonbleeding internal hemorrhoids, 5 mm tubular adenoma removed, 3 mm hyperplastic polyp removed, otherwise normal exam.  Recommended 5-year surveillance.   ESOPHAGEAL DILATION N/A 01/13/2024   Procedure: DILATION, ESOPHAGUS;  Surgeon: Cindie Dunnings POUR, DO;  Location: [facility];  Service: Endoscopy;  Laterality: N/A;   ESOPHAGOGASTRODUODENOSCOPY N/A 01/13/2024   Procedure: EGD (ESOPHAGOGASTRODUODENOSCOPY);  Surgeon: Cindie Dunnings POUR, DO;  Location: [facility];  Service: Endoscopy;  Laterality: N/A;  11:00 AM, ASA 3   ORIF FEMUR FRACTURE Left 11/30/2016   Procedure: OPEN REDUCTION INTERNAL FIXATION (ORIF) DISTAL FEMUR FRACTURE;  Surgeon: Fidel Rogue, MD;  Location: [facility];  Service: Orthopedics;  Laterality: Left;   POLYPECTOMY  10/06/2020   Procedure: POLYPECTOMY INTESTINAL;  Surgeon: Cindie Dunnings POUR, DO;  Location: [facility];  Service: Endoscopy;;   stroke     TONSILLECTOMY  OB History   No obstetric history on file.      Home Medications    Prior to Admission medications  Medication Sig Start Date End Date Taking? Authorizing Provider  amoxicillin -clavulanate (AUGMENTIN ) 875-125 MG tablet Take 1 tablet by mouth every 12 (twelve) hours. 03/13/24  Yes Stuart Vernell Norris, PA-C  azelastine  (ASTELIN )  0.1 % nasal spray Place 1 spray into both nostrils 2 (two) times daily. Use in each nostril as directed 03/13/24  Yes Stuart Vernell Norris, PA-C  promethazine -dextromethorphan (PROMETHAZINE -DM) 6.25-15 MG/5ML syrup Take 5 mLs by mouth 4 (four) times daily as needed. 03/13/24  Yes Stuart Vernell Norris, PA-C  acetaminophen  (TYLENOL ) 650 MG CR tablet Take 1,300 mg by mouth every 8 (eight) hours as needed for pain. BID    [provider]  albuterol  (VENTOLIN  HFA) 108 (90 Base) MCG/ACT inhaler Inhale 2 puffs into the lungs every 8 (eight) hours as needed for shortness of breath or wheezing. 08/10/22   Pearlean Manus, MD  apixaban  (ELIQUIS ) 5 MG TABS tablet Take 1 tablet (5 mg total) by mouth 2 (two) times daily. 11/29/22   Maree, Pratik D, DO  atorvastatin  (LIPITOR) 40 MG tablet Take 1 tablet (40 mg total) by mouth daily. 08/10/22 03/01/24  Pearlean Manus, MD  calcium  carbonate (OSCAL) 1500 (600 Ca) MG TABS tablet Take 1,500 mg by mouth 2 (two) times daily with a meal.    [provider]  dicyclomine  (BENTYL ) 10 MG capsule Take 1 capsule (10 mg total) by mouth 3 (three) times daily as needed (abdominal cramping or diarrhea. Hold in setting of constipation.). 09/21/22   Shirlean Therisa ORN, NP  diltiazem  (CARDIZEM  CD) 120 MG 24 hr capsule Take 1 capsule (120 mg total) by mouth daily. 11/14/23   Mallipeddi, Diannah SQUIBB, MD  famotidine  (PEPCID ) 20 MG tablet One after supper 04/26/22   Darlean Ozell NOVAK, MD  gabapentin  (NEURONTIN ) 300 MG capsule Take 300 mg by mouth 3 (three) times daily. 07/05/22   [provider]  Insulin  Pen Needle 31G X 5 MM MISC by Does not apply route. Patient not taking: Reported on 03/01/2024    [provider]  LANTUS  SOLOSTAR 100 UNIT/ML Solostar Pen Inject 12 Units into the skin at bedtime. 02/29/24   [provider]  LINZESS  290 MCG CAPS capsule TAKE 1 CAPSULE BY MOUTH ONCE DAILY BEFORE BREAKFAST Patient not taking: Reported on 03/01/2024 05/02/23   Shirlean Therisa ORN, NP  lubiprostone  (AMITIZA ) 24 MCG capsule TAKE 1 CAPSULE BY MOUTH TWICE DAILY WITH MEALS 03/13/24   Shirlean Therisa ORN, NP  meclizine (ANTIVERT) 25 MG tablet Take 25 mg by mouth 2 (two) times daily as needed for dizziness. 03/16/18   [provider]  metFORMIN (GLUCOPHAGE) 500 MG tablet Take 500 mg by mouth 2 (two) times daily with a meal.    [provider]  metoprolol  tartrate (LOPRESSOR ) 25 MG tablet Take 1 tablet (25 mg total) by mouth 2 (two) times daily. 11/14/23   Mallipeddi, Vishnu P, MD  montelukast  (SINGULAIR ) 10 MG tablet Take 10 mg by mouth at bedtime.    [provider]  nitrofurantoin (MACRODANTIN) 50 MG capsule Take 50 mg by mouth daily. 03/23/23   [provider]  nitroGLYCERIN  (NITROSTAT ) 0.4 MG SL tablet Place 1 tablet (0.4 mg total) under the tongue every 5 (five) minutes x 3 doses as needed for chest pain. 04/27/18   Strader, Laymon HERO, PA-C  OVER THE COUNTER MEDICATION One touch Verio Reflect glucose machine.  One touch Verio Reflect glucose test strips 1-2 daily  One touch delica plus Lancets one lancet to check glucose bid.    [provider]  pantoprazole  (PROTONIX ) 40 MG tablet Take 40 mg by mouth daily. 11/28/17   [provider]  promethazine  (PHENERGAN ) 25 MG tablet Take 1 tablet by mouth every 12 (twelve) hours as needed.    [provider]  sodium chloride  (OCEAN) 0.65 % SOLN nasal spray Place 1 spray into both nostrils as needed for congestion.    [provider]  tizanidine (ZANAFLEX) 2 MG capsule SMARTSIG:1 Capsule(s) By Mouth Every 12 Hours PRN Patient taking differently: Take 5 mg by mouth. 03/30/23   [provider]  TRESIBA FLEXTOUCH 100 UNIT/ML FlexTouch Pen SMARTSIG:15 Unit(s) SUB-Q Every Night Patient not taking: Reported on 03/01/2024 03/30/23   [provider]  VASCEPA 1 g capsule Take 2 g by mouth 2 (two) times daily. 06/17/20   [provider]    Family  History Family History  Problem Relation Age of Onset   CAD Father    CAD Brother    Colon cancer Neg Hx     Social History Social History[1]   Allergies   Ciprofloxacin, Latex, and Sulfa antibiotics   Review of Systems Review of Systems Per HPI  Physical Exam Triage Vital Signs ED Triage Vitals  Encounter Vitals Group     BP 03/13/24 1700 (!) 188/76     Girls Systolic BP Percentile --      Girls Diastolic BP Percentile --      Boys Systolic BP Percentile --      Boys Diastolic BP Percentile --      Pulse Rate 03/13/24 1700 76     Resp 03/13/24 1700 (!) 24     Temp 03/13/24 1700 98.7 F (37.1 C)     Temp Source 03/13/24 1700 Oral     SpO2 03/13/24 1700 97 %     Weight --      Height --      Head Circumference --      Peak Flow --      Pain Score 03/13/24 1702 7     Pain Loc --      Pain Education --      Exclude from Growth Chart --    No data found.  Updated Vital Signs BP (!) 188/76 (BP Location: Right Arm)   Pulse 76   Temp 98.7 F (37.1 C) (Oral)   Resp (!) 24   SpO2 97%   Visual Acuity Right Eye Distance:   Left Eye Distance:   Bilateral Distance:    Right Eye Near:   Left Eye Near:    Bilateral Near:     Physical Exam Vitals and nursing note reviewed.  Constitutional:      Appearance: Normal appearance.  HENT:     Head: Atraumatic.     Right Ear: Tympanic membrane and external ear normal.     Left Ear: External ear normal.     Ears:     Comments: Left TM erythematous and edematous    Nose: Congestion present.     Mouth/Throat:     Mouth: Mucous membranes are moist.     Pharynx: Posterior oropharyngeal erythema present.  Eyes:     Extraocular Movements: Extraocular movements intact.     Conjunctiva/sclera: Conjunctivae normal.  Cardiovascular:     Rate and Rhythm: Normal rate.  Pulmonary:     Effort: Pulmonary effort is normal.  Breath sounds: No rales.  Musculoskeletal:        General: Normal range of motion.     Cervical  back: Normal range of motion and neck supple.  Skin:    General: Skin is warm and dry.  Neurological:     Mental Status: She is alert and oriented to person, place, and time.  Psychiatric:        Mood and Affect: Mood normal.        Thought Content: Thought content normal.      UC Treatments / Results  Labs (all labs ordered are listed, but only abnormal results are displayed) Labs Reviewed - No data to display  EKG   Radiology No results found.  Procedures Procedures (including critical care time)  Medications Ordered in UC Medications - No data to display  Initial Impression / Assessment and Plan / UC Course  I have reviewed the triage vital signs and the nursing notes.  Pertinent labs & imaging results that were available during my care of the patient were reviewed by me and considered in my medical decision making (see chart for details).     Given duration and worsening course will treat with Augmentin , Astelin , Phenergan  DM, supportive over-the-counter medications and home care.  Blood pressure significantly elevated today list given of medications that will not further elevate blood pressure.  Return for worsening or unresolving symptoms.  Final Clinical Impressions(s) / UC Diagnoses   Final diagnoses:  Acute non-recurrent maxillary sinusitis  Acute suppurative otitis media of left ear without spontaneous rupture of tympanic membrane, recurrence not specified  Elevated blood pressure reading     Discharge Instructions      In addition to the prescribed medications, you may continue the Coricidin HBP, plain Mucinex, saline sinus rinses, humidifiers.  Follow-up for worsening or unresolving symptoms.    ED Prescriptions     Medication Sig Dispense Auth. Provider   amoxicillin -clavulanate (AUGMENTIN ) 875-125 MG tablet Take 1 tablet by mouth every 12 (twelve) hours. 14 tablet Stuart Vernell Norris, PA-C   azelastine  (ASTELIN ) 0.1 % nasal spray Place 1  spray into both nostrils 2 (two) times daily. Use in each nostril as directed 30 mL Stuart Vernell Norris, PA-C   promethazine -dextromethorphan (PROMETHAZINE -DM) 6.25-15 MG/5ML syrup Take 5 mLs by mouth 4 (four) times daily as needed. 100 mL Stuart Vernell Norris, [place]      PDMP not reviewed this encounter.    [1]  Social History Tobacco Use   Smoking status: Former    Current packs/day: 0.00    Average packs/day: 3.0 packs/day for 20.0 years (60.0 ttl pk-yrs)    Types: Cigarettes    Start date: 09/26/1980    Quit date: 09/26/2000    Years since quitting: 23.4   Smokeless tobacco: Never  Vaping Use   Vaping status: Never Used  Substance Use Topics   Alcohol use: No   Drug use: No     Stuart Vernell Norris, PA-C 03/13/24 1951  "

## 2024-03-13 NOTE — Discharge Instructions (Signed)
 In addition to the prescribed medications, you may continue the Coricidin HBP, plain Mucinex, saline sinus rinses, humidifiers.  Follow-up for worsening or unresolving symptoms.

## 2024-03-14 ENCOUNTER — Ambulatory Visit: Admitting: Orthopedic Surgery

## 2024-03-27 ENCOUNTER — Ambulatory Visit: Admitting: Gastroenterology

## 2024-03-27 ENCOUNTER — Telehealth (INDEPENDENT_AMBULATORY_CARE_PROVIDER_SITE_OTHER): Payer: Self-pay | Admitting: *Deleted

## 2024-05-10 ENCOUNTER — Encounter: Admitting: Gastroenterology

## 2024-05-30 ENCOUNTER — Ambulatory Visit: Admitting: Urology

## 2025-02-25 ENCOUNTER — Ambulatory Visit: Admitting: Neurology
